# Patient Record
Sex: Female | Born: 1950 | Race: Black or African American | Hispanic: No | Marital: Married | Smoking: Former smoker
Health system: Southern US, Community
[De-identification: ages and names within clinical notes are randomized; demographics above are authoritative.]

## PROBLEM LIST (undated history)

## (undated) DIAGNOSIS — T7840XA Allergy, unspecified, initial encounter: Secondary | ICD-10-CM

## (undated) DIAGNOSIS — D573 Sickle-cell trait: Secondary | ICD-10-CM

## (undated) DIAGNOSIS — G250 Essential tremor: Secondary | ICD-10-CM

## (undated) DIAGNOSIS — H409 Unspecified glaucoma: Secondary | ICD-10-CM

## (undated) DIAGNOSIS — H269 Unspecified cataract: Secondary | ICD-10-CM

## (undated) DIAGNOSIS — I509 Heart failure, unspecified: Secondary | ICD-10-CM

## (undated) DIAGNOSIS — E7800 Pure hypercholesterolemia, unspecified: Secondary | ICD-10-CM

## (undated) DIAGNOSIS — I10 Essential (primary) hypertension: Secondary | ICD-10-CM

## (undated) DIAGNOSIS — E119 Type 2 diabetes mellitus without complications: Secondary | ICD-10-CM

## (undated) DIAGNOSIS — M1990 Unspecified osteoarthritis, unspecified site: Secondary | ICD-10-CM

## (undated) DIAGNOSIS — F419 Anxiety disorder, unspecified: Secondary | ICD-10-CM

## (undated) DIAGNOSIS — E785 Hyperlipidemia, unspecified: Secondary | ICD-10-CM

## (undated) DIAGNOSIS — D75A Glucose-6-phosphate dehydrogenase (G6PD) deficiency without anemia: Secondary | ICD-10-CM

## (undated) HISTORY — PX: DIAGNOSTIC MAMMOGRAM: HXRAD719

## (undated) HISTORY — PX: REDUCTION MAMMAPLASTY: SUR839

## (undated) HISTORY — DX: Anxiety disorder, unspecified: F41.9

## (undated) HISTORY — DX: Unspecified cataract: H26.9

## (undated) HISTORY — PX: ABDOMINAL HYSTERECTOMY: SHX81

## (undated) HISTORY — PX: DG  BONE DENSITY (ARMC HX): HXRAD1102

## (undated) HISTORY — DX: Type 2 diabetes mellitus without complications: E11.9

## (undated) HISTORY — PX: ANKLE SURGERY: SHX546

## (undated) HISTORY — PX: BREAST LUMPECTOMY: SHX2

## (undated) HISTORY — DX: Pure hypercholesterolemia, unspecified: E78.00

## (undated) HISTORY — PX: EYE SURGERY: SHX253

## (undated) HISTORY — DX: Glucose-6-phosphate dehydrogenase (G6PD) deficiency without anemia: D75.A

## (undated) HISTORY — DX: Unspecified glaucoma: H40.9

## (undated) HISTORY — DX: Essential tremor: G25.0

## (undated) HISTORY — DX: Allergy, unspecified, initial encounter: T78.40XA

## (undated) HISTORY — DX: Essential (primary) hypertension: I10

## (undated) HISTORY — DX: Sickle-cell trait: D57.3

## (undated) HISTORY — DX: Hyperlipidemia, unspecified: E78.5

---

## 2004-10-13 ENCOUNTER — Other Ambulatory Visit: Admission: RE | Admit: 2004-10-13 | Discharge: 2004-10-13 | Payer: Self-pay | Admitting: Obstetrics and Gynecology

## 2004-10-20 ENCOUNTER — Ambulatory Visit (HOSPITAL_COMMUNITY): Admission: RE | Admit: 2004-10-20 | Discharge: 2004-10-20 | Payer: Self-pay | Admitting: Obstetrics and Gynecology

## 2009-11-12 LAB — HM COLONOSCOPY

## 2015-01-11 LAB — HM COLONOSCOPY

## 2019-07-16 ENCOUNTER — Encounter: Payer: Self-pay | Admitting: Neurology

## 2019-07-21 ENCOUNTER — Encounter: Payer: Self-pay | Admitting: Neurology

## 2019-07-24 ENCOUNTER — Encounter: Payer: Self-pay | Admitting: Neurology

## 2019-07-24 NOTE — Progress Notes (Addendum)
Wendy Obrien was seen today in the movement disorders clinic for neurologic consultation at the request of Loretha Brasil, FNP.  The consultation is for the evaluation of essential tremor.  Tremor: Yes.   , head tremor.  saw a neurologist in [place] a year ago.  Started on primidone, full tablet but unsure of dose and felt hot sweaty and "horrible."  She didn't take it again.  She was then started on propranolol LA, 60 mg.  Didn't see any difference and then increased to 120 mg daily.  She then noted a return of tremor and was told to increase to 3 tablets and a month ago she increased to 4 tablets on her own.  No SE.  She is noting head tremor.  Her head shakes in the "yes" direction.  That comes and goes.  It is worse when she concentrates on something.    How long has it been going on? 1-2 years and always been the head (never really been the hands)  At rest or with activation?  activation  When is it noted the most?  With concentration on something  Fam hx of tremor?  Yes.  , maternal GM had head tremor - worse when GM would have alcohol  Affected by caffeine:  No.  Affected by alcohol:  No, doesn't drink enough to know  Affected by stress:  Yes.    Affected by fatigue:  No.  Spills soup if on spoon:  No.  Spills glass of liquid if full:  No.  Affects ADL's (tying shoes, brushing teeth, etc):  No.  Tremor inducing meds:  No.   Tremor improving medications: Yes, propranolol.  Was on primidone but only tried one pill.   Doesn't notice that head tremor changes with head position.  No neck pain.  No limited rotation in the neck.    Other Specific Symptoms:  Voice: hoarse a little Sleep: trouble getting and staying asleep Postural symptoms:  Yes.  , "a little"  Falls?  No., last fall 2 years ago Loss of smell:  No. Loss of taste:  No. Urinary Incontinence:  No. Difficulty Swallowing:  No. Handwriting, micrographia: No. Trouble with ADL's:  No.  Trouble buttoning clothing:  No. Depression:  Yes.   - "especially with whats going on in these days" Memory changes:  Yes.  , concerned that memory is not as good - short term - able to do finances - able to distribute medications - has some word finding troubles N/V:  No. Lightheaded:  No.  Syncope: No. Diplopia:  No. Dyskinesia:  No.  Neuroimaging of the brain has not previously been performed.   PREVIOUS MEDICATIONS: propranolol; first dose effect with primidone  ALLERGIES:   Allergies  Allergen Reactions   Beeswax Anaphylaxis    bees   Shellfish Allergy Anaphylaxis    And all seafood   Aspirin     G6P Deficiency   Eggs Or Egg-Derived Products Hives   Nsaids     G6P Deficiency   Sulfa Antibiotics     G6P Deficiency     CURRENT MEDICATIONS:  Current Outpatient Medications  Medication Instructions   atorvastatin (LIPITOR) 20 mg, Oral, Daily   hydrochlorothiazide (HYDRODIURIL) 25 mg, Oral, Daily   latanoprost (XALATAN) 0.005 % ophthalmic solution 1 drop, Daily at bedtime   losartan (COZAAR) 100 mg, Oral, Daily   metFORMIN (GLUCOPHAGE) 500 MG tablet Oral, 2 times daily with meals   propranolol ER (INDERAL LA) 120 mg, Oral, Daily  propranolol ER (INDERAL LA) 60 mg, Oral, Daily, 3 tablets daily    timolol (BETIMOL) 0.25 % ophthalmic solution 1-2 drops, 2 times daily    PAST MEDICAL HISTORY:   Past Medical History:  Diagnosis Date   DM (diabetes mellitus) ([place])    Essential tremor    head   G6PD deficiency    HTN (hypertension)    Hyperlipidemia     PAST SURGICAL HISTORY:   Past Surgical History:  Procedure Laterality Date   ABDOMINAL HYSTERECTOMY     ANKLE SURGERY Right    x 2   BREAST LUMPECTOMY Left    benign   EYE SURGERY Left    age 23 - L eye removed    SOCIAL HISTORY:   Social History   Socioeconomic History   Marital status: Married    Spouse name: Not on file   Number of children: Not on file   Years of education: Not on file   Highest  education level: Not on file  Occupational History   Not on file  Social Needs   Financial resource strain: Not on file   Food insecurity    Worry: Not on file    Inability: Not on file   Transportation needs    Medical: Not on file    Non-medical: Not on file  Tobacco Use   Smoking status: Former Smoker    Quit date: 1998    Years since quitting: 22.8   Smokeless tobacco: Never Used  Substance and Sexual Activity   Alcohol use: Yes    Comment: occasional wine on holiday   Drug use: Not on file   Sexual activity: Yes  Lifestyle   Physical activity    Days per week: Not on file    Minutes per session: Not on file   Stress: Not on file  Relationships   Social connections    Talks on phone: Not on file    Gets together: Not on file    Attends religious service: Not on file    Active member of club or organization: Not on file    Attends meetings of clubs or organizations: Not on file    Relationship status: Not on file   Intimate partner violence    Fear of current or ex partner: Not on file    Emotionally abused: Not on file    Physically abused: Not on file    Forced sexual activity: Not on file  Other Topics Concern   Not on file  Social History Narrative   Not on file    FAMILY HISTORY:   Family Status  Relation Name Status   Mother  Deceased   Father  Deceased   Sister 59 Alive   Brother 1 Deceased   Brother  Insurance risk surveyor   Child 4 Alive    ROS:  Review of Systems  Constitutional: Negative.   HENT: Negative.   Eyes: Negative.   Cardiovascular: Negative.   Gastrointestinal: Negative.   Genitourinary: Negative.   Skin: Negative.   Endo/Heme/Allergies: Negative.     PHYSICAL EXAMINATION:    VITALS:   Vitals:   07/25/19 0931  BP: (!) 153/88  Pulse: 60  Resp: 16  Temp: 97.8 F (36.6 C)  SpO2: 96%  Weight: 276 lb 12.8 oz (125.6 kg)  Height: 5\' 8"  (1.727 m)    GEN:  The patient appears stated age and is in  NAD. HEENT:  Normocephalic, atraumatic.  The mucous membranes  are moist. The superficial temporal arteries are without ropiness or tenderness. CV:  RRR Lungs:  CTAB Neck/HEME:  There are no carotid bruits bilaterally.  There is an irregular head jerk to the left.  It is very intermittent.  It is very difficult to reproduce this.  No significant hypertrophy of the muscles of the neck.  Neurological examination:  Orientation: The patient is alert and oriented x3. Fund of knowledge is appropriate.  Recent and remote memory are intact.  Attention and concentration are normal.    Able to name objects and repeat phrases. Cranial nerves: There is good facial symmetry.  Extraocular muscles are intact on the right.  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 The visual fields are full to confrontational testing. The speech is fluent and clear. Soft palate rises symmetrically and there is no tongue deviation. Hearing is intact to conversational tone. Sensation: Sensation is intact to light and pinprick throughout (facial, trunk, extremities). Vibration is intact at the bilateral big toe but decreased distally. There is no extinction with double simultaneous stimulation. There is no sensory dermatomal level identified. Motor: Strength is 5/5 in the bilateral upper and lower extremities.   Shoulder shrug is equal and symmetric.  There is no pronator drift. Deep tendon reflexes: Deep tendon reflexes are 0-1/4 at the bilateral biceps, triceps, brachioradialis, patella and achilles. Plantar responses are downgoing bilaterally.  Movement examination: Tone: There is normal tone in the upper and lower extremities. Abnormal movements: there is mild RUE rest tremor with distraction only.  No trouble with Archimedes spirals.  No trouble with pouring water from 1 glass to another. Coordination:  There is no decremation with RAM's, with any form of RAMS, including alternating supination and pronation of the forearm, hand  opening and closing, finger taps, heel taps and toe taps. Gait and Station: The patient has no difficulty arising out of a deep-seated chair without the use of the hands. The patient's stride length is good.    Medical records indicate that the patient had lab work on July 15, 2019, including CBC, CMP, TSH.  I do not have the results of those.  Addendum labs: Lab work is received and was dated July 21, 2019.  Total iron was 138, with a percent sat of 47.  Vitamin D was low at 25.  Folate was greater than 24.  TSH was normal at 4.48.  Her B12 was likewise normal at 525.  ASSESSMENT/PLAN:  1.  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  She used to think propranolol was helpful, but that now is not so sure.  It is possible that this was helping.  Patient really would like to try to get off of the medication.  I told her that the symptom may get worse, although I have really not had propranolol be significantly helpful for head tremor in the past.  She asks me how to safely wean the medication.  She is currently on propranolol LA, 60 mg, 4 tablets nightly.  I told her she could try to drop 2 weeks and then see how she felt.  If she did not think she was worse, she could try to drop another tablet for 2 weeks and see how she felt.  She is to watch her blood pressure as well, and was it was high today.  She states that she watches it at home and it is normal.  -We  discussed the value of Botox.  She will think about that.  I suspect her symptoms really are not bad enough for that right now.  We discussed risk, benefits, and side effects of botulinum toxin, including the black box warning.  -We discussed alternative treatments like clonazepam.  2.  Memory loss  -I do not really suspect a neurodegenerative process.  She does tell me that she recently had lab work.  I have called a few times  for that lab work, but do not yet have the results.  She does state that she was recently told that she had a B12 deficiency and hypothyroidism.  It has not been treated.  She does state that she was told she had to have repeat blood work, and she is waiting the results of that.  I and see how tell her that B12 deficiency can cause some degree of memory change, depending on how low it can be.  I would recommend that she have her B12 and thyroid corrected/replaced and see how she feels in terms of memory.  She does not feel significantly better, then we can schedule neurocognitive testing.  I did tell her she could just call our office for that.   3.  We will follow up with her on an as-needed basis.  She has several questions and I answered those to the best of my ability.  Patient education was given to the patient today.  Much greater than 50% of this visit was spent in counseling and coordinating care.  Total face to face time:  45 min  Cc:  Rocco Serene, MD

## 2019-07-25 ENCOUNTER — Other Ambulatory Visit: Payer: Self-pay

## 2019-07-25 ENCOUNTER — Encounter: Payer: Self-pay | Admitting: Neurology

## 2019-07-25 ENCOUNTER — Ambulatory Visit (INDEPENDENT_AMBULATORY_CARE_PROVIDER_SITE_OTHER): Payer: Medicare Other | Admitting: Neurology

## 2019-07-25 VITALS — BP 153/88 | HR 60 | Temp 97.8°F | Resp 16 | Ht 68.0 in | Wt 276.8 lb

## 2019-07-25 DIAGNOSIS — R413 Other amnesia: Secondary | ICD-10-CM

## 2019-07-25 DIAGNOSIS — G243 Spasmodic torticollis: Secondary | ICD-10-CM

## 2019-07-25 NOTE — Patient Instructions (Signed)
Good to see you today!  If you think that the propranolol is helping stay on it.  If not, you can try to decrease it by one pill for the next 2 weeks.  If you are the same (not worse), you can drop another pill and see how you do for the next 2 weeks.  Keep [place] updated.  If your memory is still not good once your B12 and thyroid function is corrected we can get you scheduled for neurocognitive testing at our office.  Let us know about that!  The physicians and staff at [facility] are committed to providing excellent care. You may receive a survey requesting feedback about your experience at our office. We strive to receive "very good" responses to the survey questions. If you feel that your experience would prevent you from giving the office a "very good " response, please contact our office to try to remedy the situation. We may be reached at [phone]. Thank you for taking the time out of your busy day to complete the survey.

## 2019-08-13 ENCOUNTER — Other Ambulatory Visit: Payer: Self-pay | Admitting: Internal Medicine

## 2019-08-13 DIAGNOSIS — Z1231 Encounter for screening mammogram for malignant neoplasm of breast: Secondary | ICD-10-CM

## 2019-08-27 ENCOUNTER — Telehealth: Payer: Self-pay | Admitting: Neurology

## 2019-08-27 DIAGNOSIS — R413 Other amnesia: Secondary | ICD-10-CM

## 2019-08-27 NOTE — Telephone Encounter (Signed)
Please schedule testing please

## 2019-08-27 NOTE — Telephone Encounter (Signed)
I believe I have seen them, but I would have sent them to scanning where they are to be filed once I have reviewed them.  IF I am remembering correct, they were normal.  She is welcome to schedule neurocognitive testing if she would like, as per my last note.

## 2019-08-27 NOTE — Telephone Encounter (Signed)
I got her schedule to see Dr Melvyn Novas

## 2019-08-27 NOTE — Telephone Encounter (Signed)
Have you reviewed these? Not scanned into chart at this time

## 2019-08-27 NOTE — Telephone Encounter (Signed)
Lonn Georgia called regarding needing to know if our office received this patients lab work that was faxed over? She said you can call her at [phone] if you did not receive them and she will refax them. I believe we were needing them before she could schedule another appt? Thank you

## 2019-09-27 ENCOUNTER — Encounter: Payer: Self-pay | Admitting: Emergency Medicine

## 2019-09-27 ENCOUNTER — Ambulatory Visit
Admission: EM | Admit: 2019-09-27 | Discharge: 2019-09-27 | Disposition: A | Discharge status: Home / Self Care | Payer: Medicare Other

## 2019-09-27 ENCOUNTER — Other Ambulatory Visit: Payer: Self-pay

## 2019-09-27 DIAGNOSIS — H538 Other visual disturbances: Secondary | ICD-10-CM

## 2019-09-27 NOTE — Discharge Instructions (Signed)
Use moisturizing eye drops. Continue glaucoma drops as normal. Reduce screen time & improve sleep time as able. Follow up w/ Dr. Zenia Resides office Tuesday.

## 2019-09-27 NOTE — ED Provider Notes (Signed)
[name]    CSN: [id number] Arrival date & time: 09/27/19  1243      History   Chief Complaint Chief Complaint  Patient presents with  . Blurred Vision    HPI Wendy Obrien is a 69 y.o. female with history of diabetes, tremor, hypertension presenting for blurred vision since this morning.  Patient states she noticed it when she got up in the today found.  Denies visual disturbances such as wavy lines, bright lights, black curtain, visual field loss.  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  Patient does have history of glaucoma: Taking latanoprost as directed.  Patient was seen by her ophthalmologist 6 weeks ago with "a great exam ".  Patient denies trauma to her eye, mucoid discharge.  Patient noticed subtle redness to her medial canthus: Denies foreign body sensation, photophobia, eye pain, foreign body exposure.  Patient does wear corrective lenses: Denies contact use.   Past Medical History:  Diagnosis Date  . DM (diabetes mellitus) ([place])   . Essential tremor    head  . G6PD deficiency   . HTN (hypertension)   . Hyperlipidemia     There are no problems to display for this patient.   Past Surgical History:  Procedure Laterality Date  . ABDOMINAL HYSTERECTOMY    . ANKLE SURGERY Right    x 2  . BREAST LUMPECTOMY Left    benign  . EYE SURGERY Left    age 86 - L eye removed    OB History   No obstetric history on file.      Home Medications    Prior to Admission medications   Medication Sig Start Date End Date Taking? Authorizing Provider  atorvastatin (LIPITOR) 20 MG tablet Take 20 mg by mouth daily.    [provider]  hydrochlorothiazide (HYDRODIURIL) 25 MG tablet Take 25 mg by mouth daily.    [provider]  latanoprost (XALATAN) 0.005 % ophthalmic solution 1 drop at bedtime.    [provider]  losartan (COZAAR) 100 MG tablet Take 100 mg by mouth daily.    [provider]   metFORMIN (GLUCOPHAGE) 500 MG tablet Take by mouth 2 (two) times daily with a meal.    [provider]  propranolol ER (INDERAL LA) 120 MG 24 hr capsule Take 120 mg by mouth daily.    [provider]  propranolol ER (INDERAL LA) 60 MG 24 hr capsule Take 60 mg by mouth daily. 3 tablets daily    [provider]  timolol (BETIMOL) 0.25 % ophthalmic solution 1-2 drops 2 (two) times daily.    [provider]    Family History Family History  Problem Relation Age of Onset  . Lung cancer Mother   . Colon cancer Father   . HIV Brother   . HIV Brother     Social History Social History   Tobacco Use  . Smoking status: Former Smoker    Quit date: 1998    Years since quitting: 23.0  . Smokeless tobacco: Never Used  Substance Use Topics  . Alcohol use: Yes    Comment: occasional wine on holiday  . Drug use: Not on file     Allergies   Beeswax, Shellfish allergy, Aspirin, Eggs or egg-derived products, Nsaids, and Sulfa antibiotics   Review of Systems As per HPI   Physical Exam Triage Vital Signs ED Triage Vitals  Enc Vitals Group     BP  Pulse      Resp      Temp      Temp src      SpO2      Weight      Height      Head Circumference      Peak Flow      Pain Score      Pain Loc      Pain Edu?      Excl. in [place]?    No data found.  Updated Vital Signs BP 140/87 (BP Location: Left Arm)   Pulse 64   Temp 98.2 F (36.8 C) (Oral)   Resp 16   SpO2 94%   Visual Acuity Right Eye Distance:   Left Eye Distance:   Bilateral Distance:    Right Eye Near: R Near: 20/20(with corrective lense) Left Eye Near:  L Near: (prosthetic eye, no vision) Bilateral Near:  20/100  Physical Exam Constitutional:      General: She is not in acute distress.    Appearance: She is obese. She is not ill-appearing.  HENT:     Head: Normocephalic and atraumatic.  Eyes:     General: Lids are normal. Lids are everted, no foreign bodies  appreciated. Vision grossly intact. No visual field deficit or scleral icterus.       Right eye: No foreign body, discharge or hordeolum.     Extraocular Movements: Extraocular movements intact.     Right eye: Normal extraocular motion and no nystagmus.     Left eye: Abnormal extraocular motion present.     Conjunctiva/sclera: Conjunctivae normal.     Right eye: Right conjunctiva is not injected. No chemosis, exudate or hemorrhage.    Pupils:     Right eye: Pupil is round, reactive and not sluggish. No corneal abrasion.     Comments: Left eye prosthesis in place  Cardiovascular:     Rate and Rhythm: Normal rate.  Pulmonary:     Effort: Pulmonary effort is normal.  Musculoskeletal:     Cervical back: No tenderness.  Lymphadenopathy:     Cervical: No cervical adenopathy.  Skin:    Coloration: Skin is not jaundiced or pale.  Neurological:     Mental Status: She is alert and oriented to person, place, and time.      UC Treatments / Results  Labs (all labs ordered are listed, but only abnormal results are displayed) Labs Reviewed - No data to display  EKG   Radiology No results found.  Procedures Procedures (including critical care time)  Medications Ordered in UC Medications - No data to display  Initial Impression / Assessment and Plan / UC Course  I have reviewed the triage vital signs and the nursing notes.  Pertinent labs & imaging results that were available during my care of the patient were reviewed by me and considered in my medical decision making (see chart for details).     Patient afebrile, nontoxic.  Eye exam very reassuring in office.  Patient's visual acuity initially checked without corrective lenses: 20/100.  With corrective lenses 20/20 with slightly perceived blurred per patient.  Blurring appears to be intermittent in nature, low concern for acute process.  Of note patient does admit to significantly increased screen time yesterday with poor sleep.   Likely related to dry eye from excessive screen time and poor sleep.  Will try adding moisturizing eyedrops to current regimen, reducing screen time over the next 24 hours, and following up with ophthalmology via  phone on Monday.  Patient verbalized understanding and reassurance, agreeable to plan.   Final Clinical Impressions(s) / UC Diagnoses   Final diagnoses:  Blurred vision, right eye     Discharge Instructions     Use moisturizing eye drops. Continue glaucoma drops as normal. Reduce screen time & improve sleep time as able. Follow up w/ Dr. Zenia Resides office Tuesday.    ED Prescriptions    None     PDMP not reviewed this encounter.   [place], [place], [place] 09/28/19 [id number]

## 2019-09-27 NOTE — ED Triage Notes (Signed)
Pt presents to [facility] for assessment of right eye redness she noted this morning to the inner canthus, as well as blurred vision when looking at her phone.

## 2019-10-03 ENCOUNTER — Ambulatory Visit: Payer: Medicare Other

## 2019-10-06 ENCOUNTER — Other Ambulatory Visit: Payer: Self-pay | Admitting: Family Medicine

## 2019-10-06 ENCOUNTER — Other Ambulatory Visit: Payer: Self-pay

## 2019-10-06 ENCOUNTER — Ambulatory Visit
Admission: RE | Admit: 2019-10-06 | Discharge: 2019-10-06 | Disposition: A | Discharge status: Home / Self Care | Payer: Medicare Other | Source: Ambulatory Visit | Attending: Internal Medicine | Admitting: Internal Medicine

## 2019-10-06 DIAGNOSIS — Z1231 Encounter for screening mammogram for malignant neoplasm of breast: Secondary | ICD-10-CM

## 2019-10-08 ENCOUNTER — Other Ambulatory Visit: Payer: Self-pay | Admitting: Family Medicine

## 2019-10-08 DIAGNOSIS — Z780 Asymptomatic menopausal state: Secondary | ICD-10-CM

## 2019-11-03 ENCOUNTER — Encounter: Payer: Medicare Other | Admitting: Psychology

## 2019-11-04 ENCOUNTER — Ambulatory Visit (INDEPENDENT_AMBULATORY_CARE_PROVIDER_SITE_OTHER): Payer: Medicare Other | Admitting: Orthopaedic Surgery

## 2019-11-04 ENCOUNTER — Other Ambulatory Visit: Payer: Self-pay

## 2019-11-04 ENCOUNTER — Ambulatory Visit: Payer: Self-pay

## 2019-11-04 DIAGNOSIS — M25571 Pain in right ankle and joints of right foot: Secondary | ICD-10-CM | POA: Diagnosis not present

## 2019-11-04 DIAGNOSIS — M25572 Pain in left ankle and joints of left foot: Secondary | ICD-10-CM

## 2019-11-04 DIAGNOSIS — G8929 Other chronic pain: Secondary | ICD-10-CM | POA: Insufficient documentation

## 2019-11-04 NOTE — Progress Notes (Signed)
Office Visit Note   Patient: Wendy Obrien           Date of Birth: 1950/09/28           MRN: [id number] Visit Date: 11/04/2019              Requested by: Wendy Serene, MD [place],  [place] [address] PCP: Wendy Serene, MD   Assessment & Plan: Visit Diagnoses:  1. Chronic pain of both ankles     Plan: Impression bilateral ankle pain.  My suspicion is that it is due to pitting edema.  I do not identify any structural problems with her ankle.  She is not endorsing any symptoms consistent with ankle arthrosis.  I recommended compression socks during the daytime.  I will defer diuretics to her PCP.  We will see her back as needed.  Follow-Up Instructions: Return if symptoms worsen or fail to improve.   Orders:  Orders Placed This Encounter  Procedures  . XR Ankle Complete Left  . XR Ankle Complete Right   No orders of the defined types were placed in this encounter.     Procedures: No procedures performed   Clinical Data: No additional findings.   Subjective: Chief Complaint  Patient presents with  . Left Ankle - Pain  . Right Ankle - Pain    Wendy Obrien is a 69 year old female no is relocated down here from [place] comes in for evaluation of bilateral ankle pain worse on the right.  She states that the skin is very tender to touch and she has swelling around the ankles.  She does not have any pain with weightbearing or ambulation.  She is status post ORIF bimalleolar ankle fracture in 1998.  She denies any numbness and tingling.  Denies any anterior ankle pain.   Review of Systems  Constitutional: Negative.   HENT: Negative.   Eyes: Negative.   Respiratory: Negative.   Cardiovascular: Negative.   Endocrine: Negative.   Musculoskeletal: Negative.   Neurological: Negative.   Hematological: Negative.   Psychiatric/Behavioral: Negative.   All other systems reviewed and are negative.    Objective: Vital Signs: There were no vitals taken for this  visit.  Physical Exam Vitals and nursing note reviewed.  Constitutional:      Appearance: She is well-developed.  HENT:     Head: Normocephalic and atraumatic.  Pulmonary:     Effort: Pulmonary effort is normal.  Abdominal:     Palpations: Abdomen is soft.  Musculoskeletal:     Cervical back: Neck supple.  Skin:    General: Skin is warm.     Capillary Refill: Capillary refill takes less than 2 seconds.  Neurological:     Mental Status: She is alert and oriented to person, place, and time.  Psychiatric:        Behavior: Behavior normal.        Thought Content: Thought content normal.        Judgment: Judgment normal.     Ortho Exam Right ankle exam shows fully healed surgical scar.  She is very tender along this lateral scar.  She is tender along the posterior medial aspect of the ankle as well.  She has no pain with ankle dorsiflexion plantarflexion.  Subtalar motion is painless.  Left ankle exam shows tenderness anterolaterally over the ATFL.  There is no instability.  Subtalar motion is normal and painless.  Ankle range of motion is normal painless. Specialty Comments:  No specialty comments  available.  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    PMFS History: Patient Active Problem List   Diagnosis Date Noted  . Chronic pain of both ankles 11/04/2019   Past Medical History:  Diagnosis Date  . DM (diabetes mellitus) ([place])   . Essential tremor    head  . G6PD deficiency   . HTN (hypertension)   . Hyperlipidemia     Family History  Problem Relation Age of Onset  . Lung cancer Mother   . Colon cancer Father   . HIV Brother   . HIV Brother     Past Surgical History:  Procedure Laterality Date  . ABDOMINAL HYSTERECTOMY    . ANKLE SURGERY Right    x 2  . BREAST LUMPECTOMY Left     benign  . EYE SURGERY Left    age 49 - L eye removed  . REDUCTION MAMMAPLASTY Bilateral    1998   Social History   Occupational History  . Not on file  Tobacco Use  . Smoking status: Former Smoker    Quit date: 1998    Years since quitting: 23.1  . Smokeless tobacco: Never Used  Substance and Sexual Activity  . Alcohol use: Yes    Comment: occasional wine on holiday  . Drug use: Not on file  . Sexual activity: Yes

## 2019-11-17 ENCOUNTER — Encounter: Payer: Medicare Other | Admitting: Psychology

## 2019-12-25 ENCOUNTER — Ambulatory Visit
Admission: RE | Admit: 2019-12-25 | Discharge: 2019-12-25 | Disposition: A | Discharge status: Home / Self Care | Payer: Medicare Other | Source: Ambulatory Visit | Attending: Family Medicine | Admitting: Family Medicine

## 2019-12-25 ENCOUNTER — Other Ambulatory Visit: Payer: Self-pay

## 2019-12-25 DIAGNOSIS — Z780 Asymptomatic menopausal state: Secondary | ICD-10-CM

## 2020-05-06 ENCOUNTER — Other Ambulatory Visit: Payer: Self-pay | Admitting: General Practice

## 2020-05-06 DIAGNOSIS — M7989 Other specified soft tissue disorders: Secondary | ICD-10-CM

## 2020-05-12 ENCOUNTER — Ambulatory Visit
Admission: RE | Admit: 2020-05-12 | Discharge: 2020-05-12 | Disposition: A | Discharge status: Home / Self Care | Payer: Medicare Other | Source: Ambulatory Visit | Attending: General Practice | Admitting: General Practice

## 2020-05-12 ENCOUNTER — Other Ambulatory Visit: Payer: Self-pay | Admitting: General Practice

## 2020-05-12 DIAGNOSIS — M25432 Effusion, left wrist: Secondary | ICD-10-CM

## 2020-05-12 DIAGNOSIS — M7989 Other specified soft tissue disorders: Secondary | ICD-10-CM

## 2020-06-26 ENCOUNTER — Ambulatory Visit: Payer: Medicare Other | Attending: Internal Medicine

## 2020-06-26 DIAGNOSIS — Z23 Encounter for immunization: Secondary | ICD-10-CM

## 2020-06-26 NOTE — Progress Notes (Signed)
   Covid-19 Vaccination Clinic  Name:  Wendy Obrien    MRN: [id number] DOB: 07-29-1951  06/26/2020  Wendy Obrien was observed post Covid-19 immunization for 15 minutes without incident. She was provided with Vaccine Information Sheet and instruction to access the V-Safe system.   Wendy Obrien was instructed to call 911 with any severe reactions post vaccine: Marland Kitchen Difficulty breathing  . Swelling of face and throat  . A fast heartbeat  . A bad rash all over body  . Dizziness and weakness

## 2020-07-06 ENCOUNTER — Other Ambulatory Visit: Payer: Self-pay

## 2020-07-06 ENCOUNTER — Ambulatory Visit (INDEPENDENT_AMBULATORY_CARE_PROVIDER_SITE_OTHER): Payer: Medicare Other | Admitting: Podiatry

## 2020-07-06 VITALS — HR 53 | Temp 97.9°F

## 2020-07-06 DIAGNOSIS — M79676 Pain in unspecified toe(s): Secondary | ICD-10-CM | POA: Diagnosis not present

## 2020-07-06 DIAGNOSIS — L600 Ingrowing nail: Secondary | ICD-10-CM | POA: Diagnosis not present

## 2020-07-12 NOTE — Progress Notes (Signed)
  Subjective:  Patient ID: Wendy Obrien, female    DOB: September 18, 1950,  MRN: [id number]  Chief Complaint  Patient presents with  . Diabetes    requesting routine foot care  . Ingrown Toenail    right foot great toe lateral border    69 y.o. female presents with the above complaint. History confirmed with patient.   Objective:  Physical Exam: warm, good capillary refill, nail exam normal nails without lesions, no trophic changes or ulcerative lesions. DP pulses palpable, PT pulses palpable and protective sensation intact Left Foot: normal exam, no swelling, tenderness, instability; ligaments intact, full range of motion of all ankle/foot joints  Right Foot: ingrown nail right lateral border, no warmth, erythema, signs of acute infection noted.   No images are attached to the encounter.  Assessment:   1. Ingrown nail   2. Pain around toenail      Plan:  Patient was evaluated and treated and all questions answered.  Ingrown Nail -Debrided in slant back fashion. Other nails trimmed. She does not meet criteria for routine care.   No follow-ups on file.

## 2020-07-26 ENCOUNTER — Encounter: Payer: Self-pay | Admitting: Podiatry

## 2020-07-26 ENCOUNTER — Ambulatory Visit (INDEPENDENT_AMBULATORY_CARE_PROVIDER_SITE_OTHER): Payer: Medicare Other | Admitting: Podiatry

## 2020-07-26 ENCOUNTER — Other Ambulatory Visit: Payer: Self-pay

## 2020-07-26 DIAGNOSIS — L03031 Cellulitis of right toe: Secondary | ICD-10-CM

## 2020-07-26 NOTE — Progress Notes (Signed)
Subjective:   Patient ID: Wendy Obrien, female   DOB: 69 y.o.   MRN: [id number]   HPI Patient states I had a very mild procedure done to my right big toenail but it still continues to be sore and I felt something stuck up in there and I have noticed a little bit of redness and puffiness   ROS      Objective:  Physical Exam  Neurovascular status intact with patient's right hallux lateral border showing slight redness with crusted tissue and a more proximal matter that is very tender when I pressed deep into it     Assessment:  Probability for paronychia infection of the right hallux lateral border     Plan:  H&P anesthetized the digit 60 mg like Marcaine mixture sterile prep done and using sterile instrumentation I did open up the area cleaned up the tissue and utilized instrument to remove all proud flesh formation.  Patient will allow this to drain out begin soaks and sterile dressing applied today and will be seen back if any issues were to occur

## 2020-08-10 ENCOUNTER — Ambulatory Visit: Payer: Medicare Other | Admitting: Podiatry

## 2020-10-04 ENCOUNTER — Other Ambulatory Visit: Payer: Self-pay | Admitting: General Practice

## 2020-10-04 ENCOUNTER — Ambulatory Visit
Admission: RE | Admit: 2020-10-04 | Discharge: 2020-10-04 | Disposition: A | Discharge status: Home / Self Care | Payer: Medicare Other | Source: Ambulatory Visit | Attending: General Practice | Admitting: General Practice

## 2020-10-04 DIAGNOSIS — R10A0 Flank pain, unspecified side: Secondary | ICD-10-CM

## 2020-10-04 DIAGNOSIS — R109 Unspecified abdominal pain: Secondary | ICD-10-CM

## 2020-10-07 ENCOUNTER — Other Ambulatory Visit: Payer: Self-pay | Admitting: General Practice

## 2020-10-07 DIAGNOSIS — Z0000 Encounter for general adult medical examination without abnormal findings: Secondary | ICD-10-CM

## 2020-10-08 ENCOUNTER — Ambulatory Visit: Payer: Medicare Other | Admitting: Podiatry

## 2020-11-22 ENCOUNTER — Other Ambulatory Visit: Payer: Self-pay

## 2020-11-22 ENCOUNTER — Ambulatory Visit
Admission: RE | Admit: 2020-11-22 | Discharge: 2020-11-22 | Disposition: A | Discharge status: Home / Self Care | Payer: Medicare Other | Source: Ambulatory Visit | Attending: General Practice | Admitting: General Practice

## 2020-11-22 DIAGNOSIS — Z0000 Encounter for general adult medical examination without abnormal findings: Secondary | ICD-10-CM

## 2021-03-19 ENCOUNTER — Observation Stay (HOSPITAL_COMMUNITY)
Admission: EM | Admit: 2021-03-19 | Discharge: 2021-03-20 | Disposition: A | Discharge status: Home / Self Care | Payer: Medicare Other | Attending: Internal Medicine | Admitting: Internal Medicine

## 2021-03-19 ENCOUNTER — Emergency Department (HOSPITAL_COMMUNITY): Payer: Medicare Other

## 2021-03-19 ENCOUNTER — Other Ambulatory Visit: Payer: Self-pay

## 2021-03-19 DIAGNOSIS — Z79899 Other long term (current) drug therapy: Secondary | ICD-10-CM | POA: Insufficient documentation

## 2021-03-19 DIAGNOSIS — I249 Acute ischemic heart disease, unspecified: Principal | ICD-10-CM | POA: Insufficient documentation

## 2021-03-19 DIAGNOSIS — Z87891 Personal history of nicotine dependence: Secondary | ICD-10-CM | POA: Insufficient documentation

## 2021-03-19 DIAGNOSIS — I10 Essential (primary) hypertension: Secondary | ICD-10-CM | POA: Diagnosis not present

## 2021-03-19 DIAGNOSIS — Z20822 Contact with and (suspected) exposure to covid-19: Secondary | ICD-10-CM | POA: Insufficient documentation

## 2021-03-19 DIAGNOSIS — R079 Chest pain, unspecified: Secondary | ICD-10-CM | POA: Diagnosis not present

## 2021-03-19 DIAGNOSIS — E119 Type 2 diabetes mellitus without complications: Secondary | ICD-10-CM | POA: Insufficient documentation

## 2021-03-19 DIAGNOSIS — Z7984 Long term (current) use of oral hypoglycemic drugs: Secondary | ICD-10-CM | POA: Diagnosis not present

## 2021-03-19 LAB — CBC
HCT: 41.6 % (ref 36.0–46.0)
Hemoglobin: 14 g/dL (ref 12.0–15.0)
MCH: 32.6 pg (ref 26.0–34.0)
MCHC: 33.7 g/dL (ref 30.0–36.0)
MCV: 96.7 fL (ref 80.0–100.0)
Platelets: 219 10*3/uL (ref 150–400)
RBC: 4.3 MIL/uL (ref 3.87–5.11)
RDW: 11.7 % (ref 11.5–15.5)
WBC: 7.3 10*3/uL (ref 4.0–10.5)
nRBC: 0 % (ref 0.0–0.2)

## 2021-03-19 LAB — BASIC METABOLIC PANEL
Anion gap: 10 (ref 5–15)
BUN: 17 mg/dL (ref 8–23)
CO2: 25 mmol/L (ref 22–32)
Calcium: 9.5 mg/dL (ref 8.9–10.3)
Chloride: 101 mmol/L (ref 98–111)
Creatinine, Ser: 0.89 mg/dL (ref 0.44–1.00)
GFR, Estimated: >60 mL/min (ref >60)
Glucose, Bld: 214 mg/dL — ABNORMAL HIGH (ref 70–99)
Potassium: 3.7 mmol/L (ref 3.5–5.1)
Sodium: 136 mmol/L (ref 135–145)

## 2021-03-19 LAB — TROPONIN I (HIGH SENSITIVITY)
Troponin I (High Sensitivity): 4 ng/L (ref <18)
Troponin I (High Sensitivity): 3 ng/L (ref <18)

## 2021-03-19 LAB — PROTIME-INR
INR: 1 (ref 0.8–1.2)
Prothrombin Time: 13.2 seconds (ref 11.4–15.2)

## 2021-03-19 LAB — CBG MONITORING, ED: Glucose-Capillary: 145 mg/dL — ABNORMAL HIGH (ref 70–99)

## 2021-03-19 LAB — HEPARIN LEVEL (UNFRACTIONATED)
Heparin Unfractionated: >1.1 IU/mL — ABNORMAL HIGH (ref 0.30–0.70)
Heparin Unfractionated: <0.1 IU/mL — ABNORMAL LOW (ref 0.30–0.70)

## 2021-03-19 LAB — RESP PANEL BY RT-PCR (FLU A&B, COVID) ARPGX2
Influenza A by PCR: NEGATIVE
Influenza B by PCR: NEGATIVE
SARS Coronavirus 2 by RT PCR: NEGATIVE

## 2021-03-19 LAB — APTT: aPTT: 28 seconds (ref 24–36)

## 2021-03-19 MED ORDER — HEPARIN BOLUS VIA INFUSION
2000.0000 [IU] | Freq: Once | INTRAVENOUS | Status: AC
  Administered 2021-03-20: 2000 [IU] via INTRAVENOUS
  Filled 2021-03-19: qty 2000

## 2021-03-19 MED ORDER — INSULIN ASPART 100 UNIT/ML IJ SOLN
0.0000 [IU] | Freq: Three times a day (TID) | INTRAMUSCULAR | Status: DC
Start: 2021-03-20 — End: 2021-03-20
  Filled 2021-03-19: qty 0.15

## 2021-03-19 MED ORDER — INSULIN ASPART 100 UNIT/ML IJ SOLN
0.0000 [IU] | Freq: Every day | INTRAMUSCULAR | Status: DC
  Filled 2021-03-19: qty 0.05

## 2021-03-19 MED ORDER — TRAZODONE HCL 100 MG PO TABS
150.0000 mg | ORAL_TABLET | Freq: Once | ORAL | Status: AC
  Administered 2021-03-20: 150 mg via ORAL
  Filled 2021-03-19: qty 2

## 2021-03-19 MED ORDER — ACETAMINOPHEN 325 MG PO TABS: 650.0000 mg | ORAL_TABLET | ORAL | Status: DC | PRN

## 2021-03-19 MED ORDER — HEPARIN (PORCINE) 25000 UT/250ML-% IV SOLN
1400.0000 [IU]/h | INTRAVENOUS | Status: DC
  Administered 2021-03-19: 1000 [IU]/h via INTRAVENOUS
  Administered 2021-03-20: 1400 [IU]/h via INTRAVENOUS
  Filled 2021-03-19 (×2): qty 250

## 2021-03-19 MED ORDER — HEPARIN BOLUS VIA INFUSION
4000.0000 [IU] | Freq: Once | INTRAVENOUS | Status: AC
  Administered 2021-03-19: 4000 [IU] via INTRAVENOUS
  Filled 2021-03-19: qty 4000

## 2021-03-19 MED ORDER — ONDANSETRON HCL 4 MG/2ML IJ SOLN: 4.0000 mg | Freq: Four times a day (QID) | INTRAMUSCULAR | Status: DC | PRN

## 2021-03-19 MED ORDER — ASPIRIN 81 MG PO CHEW
324.0000 mg | CHEWABLE_TABLET | Freq: Once | ORAL | Status: AC
  Administered 2021-03-19: 324 mg via ORAL
  Filled 2021-03-19: qty 4

## 2021-03-19 NOTE — ED Triage Notes (Addendum)
Patient reports chest pain x3 days radiating toward ribs and shoulder. Denies nausea/vomiting. Diaphoretic in triage. Pain rated 6/10. Patient reports bilateral ankle swelling. Denies history of chf. Denies shobr.

## 2021-03-19 NOTE — H&P (Signed)
History and Physical    Wendy Obrien NLZ:[id number] DOB: 1951/03/21 DOA: 03/19/2021  PCP: Rocco Serene, MD  Patient coming from: Home  Chief Complaint: left chest pain  HPI: Wendy Obrien is a 70 y.o. female with medical history significant of DM2, G6PD, HTN, HLD. Presenting with 3 days of left chest pain. It has been a dull, achy pain. It's non-radiating. It comes in 1-minute episodes ever hour or so. She tried ibuprofen to help, but it did not. She had several episodes of diaphoresis yesterday w/ her pain. Her symptoms continued through this morning. She spoke with her PCP. It was recommended that she come to the ED. She denies any other aggravating or alleviating factors.    No lightheadedness/dizziness, no palpitations. Tried motrin. Didn't help.   ED Course: Trp negative x 2. EKG showed brady w/o st elevations. CXR was negative. EDP spoke with cardiology. They recommended admission on heparin gtt to the hospitalist service.  Review of Systems:  Denies dyspnea, palpitations, abdominal pain, N/V/D, lightheadedness, dizziness, palpitations, syncopal episodes.  Review of systems is otherwise negative for all not mentioned in HPI.   PMHx Past Medical History:  Diagnosis Date   DM (diabetes mellitus) ([place])    Essential tremor    head   G6PD deficiency    HTN (hypertension)    Hyperlipidemia     PSHx Past Surgical History:  Procedure Laterality Date   ABDOMINAL HYSTERECTOMY     ANKLE SURGERY Right    x 2   BREAST LUMPECTOMY Left    benign   EYE SURGERY Left    age 37 - L eye removed   REDUCTION MAMMAPLASTY Bilateral    1998    SocHx  reports that she quit smoking about 24 years ago. She has never used smokeless tobacco. She reports current alcohol use. No history on file for drug use.  Allergies  Allergen Reactions   Beeswax Anaphylaxis    bees   Shellfish Allergy Anaphylaxis    And all seafood   Aspirin     G6P Deficiency   Eggs Or Egg-Derived Products Hives    Nsaids     G6P Deficiency   Sulfa Antibiotics     G6P Deficiency     FamHx Family History  Problem Relation Age of Onset   Lung cancer Mother    Colon cancer Father    HIV Brother    HIV Brother     Prior to Admission medications   Medication Sig Start Date End Date Taking? Authorizing Provider  atorvastatin (LIPITOR) 40 MG tablet Take 40 mg by mouth at bedtime. 06/11/20   [provider]  busPIRone (BUSPAR) 7.5 MG tablet Take 7.5 mg by mouth 2 (two) times daily. 06/11/20   [provider]  hydrochlorothiazide (HYDRODIURIL) 25 MG tablet Take 25 mg by mouth daily. 12/27/20   [provider]  hydrochlorothiazide (HYDRODIURIL) 50 MG tablet Take 50 mg by mouth daily. 02/01/20   [provider]  latanoprost (XALATAN) 0.005 % ophthalmic solution 1 drop at bedtime.    [provider]  losartan (COZAAR) 100 MG tablet Take 100 mg by mouth daily.    [provider]  metFORMIN (GLUCOPHAGE) 500 MG tablet Take by mouth 2 (two) times daily with a meal.    [provider]  Potassium Chloride ER 20 MEQ TBCR Take 20 mEq by mouth daily. 02/27/21   [provider]  propranolol ER (INDERAL LA) 120 MG 24 hr capsule Take 120 mg by  mouth daily.    [provider]  SODIUM FLUORIDE, DENTAL RINSE, 0.2 % SOLN  07/13/20   [provider]  timolol (BETIMOL) 0.25 % ophthalmic solution 1-2 drops 2 (two) times daily.    [provider]  timolol (TIMOPTIC) 0.5 % ophthalmic solution Place 1 drop into the right eye every morning. 02/25/21   [provider]  traZODone (DESYREL) 150 MG tablet Take 150 mg by mouth at bedtime. 06/03/20   [provider]    Physical Exam: Vitals:   03/19/21 1240 03/19/21 1241 03/19/21 1245 03/19/21 1400  BP: (!) 151/88  (!) 151/88 132/77  Pulse:  (!) 54 (!) 54 (!) 51  Resp:  20 20 (!) 30  Temp:      TempSrc:      SpO2:  96% 96% 96%  Weight:      Height:         General: 70 y.o. female resting in bed in NAD Eyes: PERRL, normal sclera ENMT: Nares patent w/o discharge, orophaynx clear, dentition normal, ears w/o discharge/lesions/ulcers Neck: Supple, trachea midline Cardiovascular: RRR, +S1, S2, no m/g/r, equal pulses throughout, reproducible chest pain on exam Respiratory: CTABL, no w/r/r, normal WOB GI: BS+, NDNT, no masses noted, no organomegaly noted MSK: No e/c/c Skin: No rashes, bruises, ulcerations noted Neuro: A&O x 3, no focal deficits Psyc: Appropriate interaction and affect, calm/cooperative  Labs on Admission: I have personally reviewed following labs and imaging studies  CBC: Recent Labs  Lab 03/19/21 1109  WBC 7.3  HGB 14.0  HCT 41.6  MCV 96.7  PLT 831   Basic Metabolic Panel: Recent Labs  Lab 03/19/21 1109  NA 136  K 3.7  CL 101  CO2 25  GLUCOSE 214*  BUN 17  CREATININE 0.89  CALCIUM 9.5   GFR: Estimated Creatinine Clearance: 81.9 mL/min (by C-G formula based on SCr of 0.89 mg/dL). Liver Function Tests: No results for input(s): AST, ALT, ALKPHOS, BILITOT, PROT, ALBUMIN in the last 168 hours. No results for input(s): LIPASE, AMYLASE in the last 168 hours. No results for input(s): AMMONIA in the last 168 hours. Coagulation Profile: No results for input(s): INR, PROTIME in the last 168 hours. Cardiac Enzymes: No results for input(s): CKTOTAL, CKMB, CKMBINDEX, TROPONINI in the last 168 hours. BNP (last 3 results) No results for input(s): PROBNP in the last 8760 hours. HbA1C: No results for input(s): HGBA1C in the last 72 hours. CBG: No results for input(s): GLUCAP in the last 168 hours. Lipid Profile: No results for input(s): CHOL, HDL, LDLCALC, TRIG, CHOLHDL, LDLDIRECT in the last 72 hours. Thyroid Function Tests: No results for input(s): TSH, T4TOTAL, FREET4, T3FREE, THYROIDAB in the last 72 hours. Anemia Panel: No results for input(s): VITAMINB12, FOLATE, FERRITIN, TIBC, IRON, RETICCTPCT in the last  72 hours. Urine analysis: No results found for: COLORURINE, APPEARANCEUR, [place], [place], GLUCOSEU, HGBUR, BILIRUBINUR, KETONESUR, PROTEINUR, UROBILINOGEN, NITRITE, LEUKOCYTESUR  Radiological Exams on Admission: DG Chest 2 View  Result Date: 03/19/2021 CLINICAL DATA:  Chest pain for 3 days. EXAM: CHEST - 2 VIEW COMPARISON:  None. FINDINGS: Heart size and mediastinal contours are within normal limits. Lungs are clear. No pleural effusion or pneumothorax is seen. Mild degenerative change within the thoracic spine. No acute-appearing osseous abnormality. IMPRESSION: No active cardiopulmonary disease. No evidence of pneumonia or pulmonary edema. Electronically Signed   By: Franki Cabot M.D.   On: 03/19/2021 11:54    EKG: Independently reviewed. Sinus brady, no st elevations  Assessment/Plan Chest pain     -  admit to inpt, SDU     - heparin gtt     - follow trp     - EKG shows sinus brady w/o st elevation     - check echo     - cardiology consulted by EDP; awaiting final recs  Sinus bradycardia     - asymptomatic     - on propranolol for essential tremor; may need to decrease  DM2     - SSI, DM diet, A1c, glucose check   HTN     - continue home regimen  G6PD     - continue outpt follow up; ok for heparin gtt  HLD     - continue statin  Glaucoma     - continue drops  Essential tremor     - on propranolol  DVT prophylaxis: heparin gtt  Code Status: FULL  Family Communication: None at bedside  Consults called: EDP spoke with cardiology   Status is: Inpatient  Remains inpatient appropriate because:Inpatient level of care appropriate due to severity of illness  Dispo: The patient is from: Home              Anticipated d/c is to: Home              Patient currently is not medically stable to d/c.   Difficult to place patient No  Time spent coordinating admission: 60 minutes  [facility]  If 7PM-7AM, please contact  night-coverage [email or web address]  03/19/2021, 2:32 PM

## 2021-03-19 NOTE — ED Provider Notes (Signed)
[facility] DEPT Provider Note   CSN: [id number] Arrival date & time: 03/19/21  1043     History Chief Complaint  Patient presents with   Chest Pain    Wendy Obrien is a 70 y.o. female.  HPI  HPI: A 70 year old patient with a history of treated diabetes, hypertension, hypercholesterolemia and obesity presents for evaluation of chest pain. Initial onset of pain was more than 6 hours ago. The patient's chest pain is described as heaviness/pressure/tightness and is not worse with exertion. The patient reports some diaphoresis. The patient's chest pain is middle- or left-sided, is not well-localized, is not sharp and does radiate to the arms/jaw/neck. The patient does not complain of nausea. The patient has no history of stroke, has no history of peripheral artery disease, has not smoked in the past 90 days and has no relevant family history of coronary artery disease (first degree relative at less than age 74).    Patient reports first episode of chest pain was about 3 days ago.  She was doing dishes and she got a central chest discomfort that radiated into her shoulder blades.  She reports she felt a little sweaty with it as well.  She reports since that time she has been getting episodes of some chest discomfort with radiation around towards her lower ribs and shoulder blades.  She reports it is coming and going and resolving on its own.  She does not currently have pain.  She reports however she is also noticing that she is breaking out in a sweat a little more frequently.  She reports she is usually very physically active but is starting to notice that she is getting more easily fatigued.  She reports she is noting a little bit more swelling around her feet and ankles.  No calf pain.  No fever no cough.  No generalized body ache.  Up-to-date with COVID vaccines.  Family history: Patient's father had coronary artery disease with sudden death in his early  81s.  Patient does not smoke or drink alcohol. Past Medical History:  Diagnosis Date   DM (diabetes mellitus) ([place])    Essential tremor    head   G6PD deficiency    HTN (hypertension)    Hyperlipidemia     Patient Active Problem List   Diagnosis Date Noted   Chronic pain of both ankles 11/04/2019    Past Surgical History:  Procedure Laterality Date   ABDOMINAL HYSTERECTOMY     ANKLE SURGERY Right    x 2   BREAST LUMPECTOMY Left    benign   EYE SURGERY Left    age 84 - L eye removed   REDUCTION MAMMAPLASTY Bilateral    1998     OB History   No obstetric history on file.     Family History  Problem Relation Age of Onset   Lung cancer Mother    Colon cancer Father    HIV Brother    HIV Brother     Social History   Tobacco Use   Smoking status: Former    Pack years: 0.00    Types: Cigarettes    Quit date: 1998    Years since quitting: 24.5   Smokeless tobacco: Never  Vaping Use   Vaping Use: Never used  Substance Use Topics   Alcohol use: Yes    Comment: occasional wine on holiday    Home Medications Prior to Admission medications   Medication Sig Start Date End Date Taking?  Authorizing Provider  atorvastatin (LIPITOR) 40 MG tablet Take 40 mg by mouth at bedtime. 06/11/20   [provider]  busPIRone (BUSPAR) 7.5 MG tablet Take 7.5 mg by mouth 2 (two) times daily. 06/11/20   [provider]  hydrochlorothiazide (HYDRODIURIL) 25 MG tablet Take 25 mg by mouth daily. 12/27/20   [provider]  hydrochlorothiazide (HYDRODIURIL) 50 MG tablet Take 50 mg by mouth daily. 02/01/20   [provider]  latanoprost (XALATAN) 0.005 % ophthalmic solution 1 drop at bedtime.    [provider]  losartan (COZAAR) 100 MG tablet Take 100 mg by mouth daily.    [provider]  metFORMIN (GLUCOPHAGE) 500 MG tablet Take by mouth 2 (two) times daily with a meal.    [provider]  Potassium Chloride ER 20 MEQ TBCR  Take 20 mEq by mouth daily. 02/27/21   [provider]  propranolol ER (INDERAL LA) 120 MG 24 hr capsule Take 120 mg by mouth daily.    [provider]  SODIUM FLUORIDE, DENTAL RINSE, 0.2 % SOLN  07/13/20   [provider]  timolol (BETIMOL) 0.25 % ophthalmic solution 1-2 drops 2 (two) times daily.    [provider]  timolol (TIMOPTIC) 0.5 % ophthalmic solution Place 1 drop into the right eye every morning. 02/25/21   [provider]  traZODone (DESYREL) 150 MG tablet Take 150 mg by mouth at bedtime. 06/03/20   [provider]    Allergies    Beeswax, Shellfish allergy, Aspirin, Eggs or egg-derived products, Nsaids, and Sulfa antibiotics  Review of Systems   Review of Systems 10 Systems reviewed and negative except as per HPI Physical Exam Updated Vital Signs BP (!) 151/88 (BP Location: Right Arm)   Pulse (!) 54   Temp 98.7 F (37.1 C) (Oral)   Resp 20   Ht 5\' 8"  (1.727 m)   Wt 124.7 kg   SpO2 96%   BMI 41.81 kg/m   Physical Exam Constitutional:      Appearance: Normal appearance.  HENT:     Mouth/Throat:     Pharynx: Oropharynx is clear.  Cardiovascular:     Rate and Rhythm: Normal rate and regular rhythm.  Pulmonary:     Effort: Pulmonary effort is normal.     Breath sounds: Normal breath sounds.  Abdominal:     General: There is no distension.     Palpations: Abdomen is soft.     Tenderness: There is no abdominal tenderness. There is no guarding.  Musculoskeletal:     Cervical back: Neck supple.     Comments: No calf tenderness.  Patient has some chronic swelling of the ankle joint themselves from old surgical treatment.  No erythema or tenderness.  Very subtle edema over the dorsum of each foot.  Skin:    General: Skin is warm and dry.  Neurological:     General: No focal deficit present.     Mental Status: She is oriented to person, place, and time.     Motor: No weakness.     Coordination: Coordination normal.   Psychiatric:        Mood and Affect: Mood normal.    ED Results / Procedures / Treatments   Labs (all labs ordered are listed, but only abnormal results are displayed) Labs Reviewed  BASIC METABOLIC PANEL - Abnormal; Notable for the following components:      Result Value   Glucose, Bld 214 (*)    All  other components within normal limits  RESP PANEL BY RT-PCR (FLU A&B, COVID) ARPGX2  CBC  TROPONIN I (HIGH SENSITIVITY)  TROPONIN I (HIGH SENSITIVITY)    EKG EKG Interpretation  Date/Time:  Saturday March 19 2021 10:52:04 EDT Ventricular Rate:  50 PR Interval:  198 QRS Duration: 84 QT Interval:  434 QTC Calculation: 395 R Axis:   -10 Text Interpretation: Sinus bradycardia Nonspecific T wave abnormality Abnormal ECG agree, no STEMI, no old comparison Confirmed by Charlesetta Shanks [phone]) on 03/19/2021 12:19:51 PM  Radiology DG Chest 2 View  Result Date: 03/19/2021 CLINICAL DATA:  Chest pain for 3 days. EXAM: CHEST - 2 VIEW COMPARISON:  None. FINDINGS: Heart size and mediastinal contours are within normal limits. Lungs are clear. No pleural effusion or pneumothorax is seen. Mild degenerative change within the thoracic spine. No acute-appearing osseous abnormality. IMPRESSION: No active cardiopulmonary disease. No evidence of pneumonia or pulmonary edema. Electronically Signed   By: Franki Cabot M.D.   On: 03/19/2021 11:54    Procedures Procedures  CRITICAL CARE Performed by: Charlesetta Shanks   Total critical care time: 30 minutes  Critical care time was exclusive of separately billable procedures and treating other patients.  Critical care was necessary to treat or prevent imminent or life-threatening deterioration.  Critical care was time spent personally by me on the following activities: development of treatment plan with patient and/or surrogate as well as nursing, discussions with consultants, evaluation of patient's response to treatment, examination of patient, obtaining  history from patient or surrogate, ordering and performing treatments and interventions, ordering and review of laboratory studies, ordering and review of radiographic studies, pulse oximetry and re-evaluation of patient's condition.  Medications Ordered in ED Medications - No data to display  ED Course  I have reviewed the triage vital signs and the nursing notes.  Pertinent labs & imaging results that were available during my care of the patient were reviewed by me and considered in my medical decision making (see chart for details).    MDM Rules/Calculators/A&P HEAR Score: 7                        Patient presents with chest pain history concerning for angina.  Patient does have significant risk factors.  EKG does not show STEMI but nonspecific T wave abnormality without old comparison available.  Cardiology consultation recommends admission on heparin for further evaluation.  Final Clinical Impression(s) / ED Diagnoses Final diagnoses:  ACS (acute coronary syndrome) [facility])    Rx / DC Orders ED Discharge Orders     None        Charlesetta Shanks, MD 04/03/21 1050

## 2021-03-19 NOTE — Progress Notes (Signed)
ANTICOAGULATION CONSULT NOTE - Initial Consult  Pharmacy Consult for Heparin Indication: ACS  Allergies  Allergen Reactions   Beeswax Anaphylaxis    bees   Shellfish Allergy Anaphylaxis    And all seafood   Aspirin     G6P Deficiency   Eggs Or Egg-Derived Products Hives   Nsaids     G6P Deficiency   Sulfa Antibiotics     G6P Deficiency     Patient Measurements: Height: 5\' 8"  (172.7 cm) Weight: 124.7 kg (275 lb) IBW/kg (Calculated) : 63.9 Heparin Dosing Weight: 93 kg  Vital Signs: Temp: 98.7 F (37.1 C) (07/09 1058) Temp Source: Oral (07/09 1058) BP: 132/77 (07/09 1400) Pulse Rate: 51 (07/09 1400)  Labs: Recent Labs    03/19/21 1109 03/19/21 1341  HGB 14.0  --   HCT 41.6  --   PLT 219  --   CREATININE 0.89  --   TROPONINIHS 4 3    Estimated Creatinine Clearance: 81.9 mL/min (by C-G formula based on SCr of 0.89 mg/dL).   Medical History: Past Medical History:  Diagnosis Date   DM (diabetes mellitus) ([place])    Essential tremor    head   G6PD deficiency    HTN (hypertension)    Hyperlipidemia     Medications:  Scheduled:  Infusions:   Assessment: Wendy Obrien presented to ED on 7/9 with chest pain, first started 3 days ago.  Pharmacy is consulted to dose Heparin for ACS.  No prior to admission anticoagulants. Baseline coags pending CBC: Hgb and Plt WNL Troponin: 4, 3  Goal of Therapy:  Heparin level 0.3-0.7 units/ml Monitor platelets by anticoagulation protocol: Yes   Plan:  Give heparin 4000 units bolus IV x 1 Start heparin IV infusion at 1000 units/hr Heparin level 6 hours after starting Daily heparin level and CBC   Gretta Arab PharmD, BCPS Clinical Pharmacist [facility] main pharmacy [phone] 03/19/2021 3:00 PM

## 2021-03-19 NOTE — Progress Notes (Signed)
ANTICOAGULATION CONSULT NOTE - Initial Consult  Pharmacy Consult for Heparin Indication: ACS  Allergies  Allergen Reactions   Bee Venom Anaphylaxis   Beeswax Anaphylaxis    bees   Fish Allergy Anaphylaxis   Shellfish Allergy Anaphylaxis    And all seafood   Aspirin Other (See Comments)    G6P Deficiency   Eggs Or Egg-Derived Products Hives   Nsaids Other (See Comments)    G6P Deficiency   Sulfa Antibiotics Other (See Comments)    G6P Deficiency     Patient Measurements: Height: 5\' 8"  (172.7 cm) Weight: 124.7 kg (275 lb) IBW/kg (Calculated) : 63.9 Heparin Dosing Weight: 93 kg  Vital Signs: Temp: 98.7 F (37.1 C) (07/09 1058) Temp Source: Oral (07/09 1058) BP: 162/80 (07/09 2100) Pulse Rate: 48 (07/09 2100)  Labs: Recent Labs    03/19/21 1109 03/19/21 1341 03/19/21 2000  HGB 14.0  --   --   HCT 41.6  --   --   PLT 219  --   --   APTT 28  --   --   LABPROT 13.2  --   --   INR 1.0  --   --   HEPARINUNFRC  --   --  >1.10*  CREATININE 0.89  --   --   TROPONINIHS 4 3  --      Estimated Creatinine Clearance: 81.9 mL/min (by C-G formula based on SCr of 0.89 mg/dL).   Medical History: Past Medical History:  Diagnosis Date   DM (diabetes mellitus) ([place])    Essential tremor    head   G6PD deficiency    HTN (hypertension)    Hyperlipidemia     Medications:  Scheduled:  Infusions:   Assessment: 24 yoF presented to ED on 7/9 with chest pain, first started 3 days ago.  Pharmacy is consulted to dose Heparin for ACS.  No prior to admission anticoagulants. Baseline coags pending CBC: Hgb and Plt WNL Troponin: 4, 3  Goal of Therapy:  Heparin level 0.3-0.7 units/ml Monitor platelets by anticoagulation protocol: Yes  Today, 03/19/2021 Initiated Heparin with 4000 unit bolus, infusion started at 1000 units/hr 6 hr Heparin level > 1.1, but sample drawn from the IV line that Heparin infusing into (RN noted she flushed the line, then drew level)   Plan:   Repeat Heparin level, via venipuncture in opposite arm Daily CBC ordered, plan daily Heparin level at steady state  Minda Ditto PharmD 03/19/2021, 10:06 PM

## 2021-03-19 NOTE — Progress Notes (Signed)
ANTICOAGULATION CONSULT NOTE  Pharmacy Consult for Heparin Indication: ACS  Allergies  Allergen Reactions   Bee Venom Anaphylaxis   Beeswax Anaphylaxis    bees   Fish Allergy Anaphylaxis   Shellfish Allergy Anaphylaxis    And all seafood   Aspirin Other (See Comments)    G6P Deficiency   Eggs Or Egg-Derived Products Hives   Nsaids Other (See Comments)    G6P Deficiency   Sulfa Antibiotics Other (See Comments)    G6P Deficiency     Patient Measurements: Height: 5\' 8"  (172.7 cm) Weight: 124.7 kg (275 lb) IBW/kg (Calculated) : 63.9 Heparin Dosing Weight: 93 kg  Vital Signs: BP: 146/76 (07/09 2300) Pulse Rate: 57 (07/09 2300)  Labs: Recent Labs    03/19/21 1109 03/19/21 1341 03/19/21 2000 03/19/21 2230  HGB 14.0  --   --   --   HCT 41.6  --   --   --   PLT 219  --   --   --   APTT 28  --   --   --   LABPROT 13.2  --   --   --   INR 1.0  --   --   --   HEPARINUNFRC  --   --  >1.10* <0.10*  CREATININE 0.89  --   --   --   TROPONINIHS 4 3  --   --      Estimated Creatinine Clearance: 81.9 mL/min (by C-G formula based on SCr of 0.89 mg/dL).   Medical History: Past Medical History:  Diagnosis Date   DM (diabetes mellitus) ([place])    Essential tremor    head   G6PD deficiency    HTN (hypertension)    Hyperlipidemia     Medications:  Scheduled:  Infusions:   Assessment: 33 yoF presented to ED on 7/9 with chest pain, first started 3 days ago.  Pharmacy is consulted to dose Heparin for ACS.  No prior to admission anticoagulants. Baseline coags WNL; CBC: Hgb and Plt WNL  03/19/2021: Repeat heparin level drawn peripherally from opposite arm <0.1- SUBtherpaeutic on IV heparin 1000 units/hr CBC WNL No bleeding or infusion related concerns per RN  Goal of Therapy:  Heparin level 0.3-0.7 units/ml Monitor platelets by anticoagulation protocol: Yes   Plan:  Rebolus heparin 2000 units IV x1 then increase heparin infusion to 1400 units/hr Check 6h heparin  level after rate increase Daily heparin level & CBC while on heparin  Netta Cedars PharmD 03/19/2021, 11:47 PM

## 2021-03-20 ENCOUNTER — Encounter (HOSPITAL_COMMUNITY): Payer: Self-pay | Admitting: Internal Medicine

## 2021-03-20 ENCOUNTER — Inpatient Hospital Stay (HOSPITAL_BASED_OUTPATIENT_CLINIC_OR_DEPARTMENT_OTHER): Payer: Medicare Other

## 2021-03-20 ENCOUNTER — Ambulatory Visit (HOSPITAL_BASED_OUTPATIENT_CLINIC_OR_DEPARTMENT_OTHER)
Admit: 2021-03-20 | Discharge: 2021-03-20 | Disposition: A | Discharge status: Home / Self Care | Payer: Medicare Other | Attending: Cardiovascular Disease | Admitting: Cardiovascular Disease

## 2021-03-20 DIAGNOSIS — I10 Essential (primary) hypertension: Secondary | ICD-10-CM | POA: Insufficient documentation

## 2021-03-20 DIAGNOSIS — E782 Mixed hyperlipidemia: Secondary | ICD-10-CM | POA: Diagnosis not present

## 2021-03-20 DIAGNOSIS — R079 Chest pain, unspecified: Secondary | ICD-10-CM

## 2021-03-20 DIAGNOSIS — I249 Acute ischemic heart disease, unspecified: Secondary | ICD-10-CM | POA: Diagnosis not present

## 2021-03-20 DIAGNOSIS — E785 Hyperlipidemia, unspecified: Secondary | ICD-10-CM | POA: Insufficient documentation

## 2021-03-20 DIAGNOSIS — R0789 Other chest pain: Secondary | ICD-10-CM

## 2021-03-20 LAB — NM MYOCAR MULTI W/SPECT W/WALL MOTION / EF
Estimated workload: 1 METS
Exercise duration (min): 0 min
Exercise duration (sec): 0 s
MPHR: 150 {beats}/min
Peak HR: 112 {beats}/min
Percent HR: 74 %
RPE: 0
Rest HR: 58 {beats}/min

## 2021-03-20 LAB — ECHOCARDIOGRAM COMPLETE
AR max vel: 2.32 cm2
AV Area VTI: 2.1 cm2
AV Area mean vel: 2.16 cm2
AV Mean grad: 5 mmHg
AV Peak grad: 9.5 mmHg
Ao pk vel: 1.54 m/s
Area-P 1/2: 2.68 cm2
Calc EF: 68.8 %
Height: 68 in
MV M vel: 6.09 m/s
MV Peak grad: 148.4 mmHg
P 1/2 time: 659 msec
Radius: 0.4 cm
S' Lateral: 2.7 cm
Single Plane A2C EF: 68.2 %
Single Plane A4C EF: 69.2 %
Weight: 4400 oz

## 2021-03-20 LAB — CBG MONITORING, ED: Glucose-Capillary: 146 mg/dL — ABNORMAL HIGH (ref 70–99)

## 2021-03-20 LAB — LIPID PANEL
Cholesterol: 99 mg/dL (ref 0–200)
HDL: 36 mg/dL — ABNORMAL LOW (ref >40)
LDL Cholesterol: 17 mg/dL (ref 0–99)
Total CHOL/HDL Ratio: 2.8 RATIO
Triglycerides: 228 mg/dL — ABNORMAL HIGH (ref <150)
VLDL: 46 mg/dL — ABNORMAL HIGH (ref 0–40)

## 2021-03-20 LAB — CBC
HCT: 39.3 % (ref 36.0–46.0)
Hemoglobin: 12.9 g/dL (ref 12.0–15.0)
MCH: 32.3 pg (ref 26.0–34.0)
MCHC: 32.8 g/dL (ref 30.0–36.0)
MCV: 98.3 fL (ref 80.0–100.0)
Platelets: 179 10*3/uL (ref 150–400)
RBC: 4 MIL/uL (ref 3.87–5.11)
RDW: 11.6 % (ref 11.5–15.5)
WBC: 9.1 10*3/uL (ref 4.0–10.5)
nRBC: 0 % (ref 0.0–0.2)

## 2021-03-20 LAB — HEMOGLOBIN A1C
Hgb A1c MFr Bld: 5.9 % — ABNORMAL HIGH (ref 4.8–5.6)
Mean Plasma Glucose: 122.63 mg/dL

## 2021-03-20 LAB — HEPARIN LEVEL (UNFRACTIONATED)
Heparin Unfractionated: 0.45 IU/mL (ref 0.30–0.70)
Heparin Unfractionated: 0.71 IU/mL — ABNORMAL HIGH (ref 0.30–0.70)

## 2021-03-20 LAB — MRSA NEXT GEN BY PCR, NASAL: MRSA by PCR Next Gen: NOT DETECTED

## 2021-03-20 LAB — HIV ANTIBODY (ROUTINE TESTING W REFLEX): HIV Screen 4th Generation wRfx: NONREACTIVE

## 2021-03-20 LAB — TROPONIN I (HIGH SENSITIVITY): Troponin I (High Sensitivity): 4 ng/L (ref <18)

## 2021-03-20 MED ORDER — TRAZODONE HCL 50 MG PO TABS: 75.0000 mg | ORAL_TABLET | Freq: Every day | ORAL | Status: DC

## 2021-03-20 MED ORDER — BUSPIRONE HCL 5 MG PO TABS
7.5000 mg | ORAL_TABLET | Freq: Every day | ORAL | Status: DC
  Administered 2021-03-20: 7.5 mg via ORAL
  Filled 2021-03-20: qty 1.5

## 2021-03-20 MED ORDER — FUROSEMIDE 20 MG PO TABS: 20.0000 mg | ORAL_TABLET | Freq: Every day | ORAL | Status: DC | PRN

## 2021-03-20 MED ORDER — ATORVASTATIN CALCIUM 40 MG PO TABS
40.0000 mg | ORAL_TABLET | Freq: Every day | ORAL | Status: DC
  Administered 2021-03-20: 40 mg via ORAL
  Filled 2021-03-20: qty 1

## 2021-03-20 MED ORDER — TECHNETIUM TC 99M TETROFOSMIN IV KIT
29.0000 | PACK | Freq: Once | INTRAVENOUS | Status: AC | PRN
  Administered 2021-03-20: 29 via INTRAVENOUS

## 2021-03-20 MED ORDER — LOSARTAN POTASSIUM 50 MG PO TABS
100.0000 mg | ORAL_TABLET | Freq: Every day | ORAL | Status: DC
  Administered 2021-03-20: 100 mg via ORAL
  Filled 2021-03-20: qty 4

## 2021-03-20 MED ORDER — REGADENOSON 0.4 MG/5ML IV SOLN: 0.4000 mg | Freq: Once | INTRAVENOUS | Status: AC

## 2021-03-20 MED ORDER — HYDROCHLOROTHIAZIDE 25 MG PO TABS
25.0000 mg | ORAL_TABLET | Freq: Every day | ORAL | Status: DC
  Administered 2021-03-20: 25 mg via ORAL
  Filled 2021-03-20: qty 1

## 2021-03-20 MED ORDER — TECHNETIUM TC 99M TETROFOSMIN IV KIT
11.0000 | PACK | Freq: Once | INTRAVENOUS | Status: AC | PRN
  Administered 2021-03-20: 11 via INTRAVENOUS

## 2021-03-20 MED ORDER — CHLORHEXIDINE GLUCONATE CLOTH 2 % EX PADS
6.0000 | MEDICATED_PAD | Freq: Every day | CUTANEOUS | Status: DC
  Administered 2021-03-20: 6 via TOPICAL

## 2021-03-20 MED ORDER — HYDRALAZINE HCL 25 MG PO TABS: 25.0000 mg | ORAL_TABLET | Freq: Four times a day (QID) | ORAL | Status: DC | PRN

## 2021-03-20 MED ORDER — REGADENOSON 0.4 MG/5ML IV SOLN
INTRAVENOUS | Status: AC
  Administered 2021-03-20: 0.4 mg via INTRAVENOUS
  Filled 2021-03-20: qty 5

## 2021-03-20 MED ORDER — PROPRANOLOL HCL ER 120 MG PO CP24
120.0000 mg | ORAL_CAPSULE | Freq: Every day | ORAL | Status: DC
  Filled 2021-03-20: qty 1

## 2021-03-20 NOTE — Care Management CC44 (Signed)
Condition Code 44 Documentation Completed  Patient Details  Name: Wendy Obrien MRN: [id number] Date of Birth: 06-26-1951   Condition Code 44 given:  Yes Patient signature on Condition Code 44 notice:  Yes Documentation of 2 MD's agreement:  Yes Code 44 added to claim:  Yes    Alyssa Mancera, LCSW 03/20/2021, 4:13 PM

## 2021-03-20 NOTE — Consult Note (Signed)
Cardiology Consultation:   Patient ID: Wendy Obrien MRN: [id number]; DOB: 11/09/1950  Admit date: 03/19/2021 Date of Consult: 03/20/2021  PCP:  Rocco Serene, MD   [facility] Providers Cardiologist:  None        Patient Profile:   Wendy Obrien is a 70 y.o. female with a hx of diabetes mellitus, hypertension, hyperlipidemia who is being seen 03/20/2021 for the evaluation of chest discomfort at the request of Dr. Marylyn Ishihara.Marland Kitchen  History of Present Illness:   Wendy Obrien is a 70 year old female with history of diabetes, hypertension and hyperlipidemia.  She presented to the emergency room yesterday with 3 days of chest pain.  It was dull achy pain.  It was intermittently and would come in 1 minute intervals every hour or so.   The chest pain is described as midsternal.  It last for about a minute or so.  It radiates to between her shoulder blades. She also has some palpitations that tend to make her cough.  Clinically these sound like premature ventricular contractions. She was getting an echocardiogram while I examined her today.  She was having PVCs on the echo telemetry.  She also had long runs of bigeminy.  Troponin levels are negative. ECG reveals sinus bradycardia at 50 beats a minute.  She has T wave versions in lead V3.  She has a severe allergy to iodine.  We considered doing a coronary CT angiogram but I think she will need a full day of premedications if we need to give her IV contrast. She is currently pain-free.  Past Medical History:  Diagnosis Date   DM (diabetes mellitus) ([place])    Essential tremor    head   G6PD deficiency    HTN (hypertension)    Hyperlipidemia     Past Surgical History:  Procedure Laterality Date   ABDOMINAL HYSTERECTOMY     ANKLE SURGERY Right    x 2   BREAST LUMPECTOMY Left    benign   EYE SURGERY Left    age 20 - L eye removed   REDUCTION MAMMAPLASTY Bilateral    1998     Home Medications:  Prior to Admission medications    Medication Sig Start Date End Date Taking? Authorizing Provider  acetaminophen (TYLENOL) 500 MG tablet Take 500-1,000 mg by mouth daily as needed for headache (pain).   Yes [provider]  atorvastatin (LIPITOR) 40 MG tablet Take 40 mg by mouth daily with breakfast. 06/11/20  Yes [provider]  BETA CAROTENE PO Take 1 tablet by mouth daily with breakfast.   Yes [provider]  busPIRone (BUSPAR) 7.5 MG tablet Take 7.5 mg by mouth daily with breakfast. 06/11/20  Yes [provider]  EVENING PRIMROSE OIL PO Take 1 capsule by mouth daily with breakfast.   Yes [provider]  furosemide (LASIX) 20 MG tablet Take 20 mg by mouth daily as needed (feet swelling). 12/02/20  Yes [provider]  hydrochlorothiazide (HYDRODIURIL) 25 MG tablet Take 25 mg by mouth daily with breakfast. 12/27/20  Yes [provider]  ibuprofen (ADVIL) 600 MG tablet Take 600 mg by mouth at bedtime as needed (pain).   Yes [provider]  latanoprost (XALATAN) 0.005 % ophthalmic solution Place 1 drop into the right eye at bedtime.   Yes [provider]  losartan (COZAAR) 100 MG tablet Take 100 mg by mouth daily with breakfast.   Yes [provider]  Magnesium 300 MG CAPS Take 300 mg by mouth  daily with breakfast.   Yes [provider]  metFORMIN (GLUCOPHAGE) 500 MG tablet Take 500 mg by mouth daily with breakfast.   Yes [provider]  Misc Natural Products (NEURIVA) CAPS Take 1 capsule by mouth daily with breakfast.   Yes [provider]  Multiple Vitamin (MULTIVITAMIN WITH MINERALS) TABS tablet Take 1 tablet by mouth daily with breakfast.   Yes [provider]  Multiple Vitamins-Minerals (HAIR/SKIN/NAILS/BIOTIN) TABS Take 1 tablet by mouth daily with breakfast.   Yes [provider]  Omega-3 Fatty Acids (FISH OIL) 1200 MG CAPS Take 1,200 mg by mouth daily with breakfast.   Yes [provider]  Potassium Chloride ER 20 MEQ TBCR Take 20 mEq by mouth daily as needed (with each dose of Lasix). 02/27/21  Yes [provider]  propranolol ER (INDERAL LA) 120 MG 24 hr capsule Take 120 mg by mouth at bedtime.   Yes [provider]  SODIUM FLUORIDE, DENTAL RINSE, 0.2 % SOLN Place 1 application onto teeth every 14 (fourteen) days. 07/13/20  Yes [provider]  timolol (TIMOPTIC) 0.5 % ophthalmic solution Place 1 drop into the right eye every morning. 02/25/21  Yes [provider]  traZODone (DESYREL) 150 MG tablet Take 75 mg by mouth at bedtime. 06/03/20  Yes [provider]    Inpatient Medications: Scheduled Meds:  atorvastatin  40 mg Oral Q breakfast   busPIRone  7.5 mg Oral Q breakfast   hydrochlorothiazide  25 mg Oral Q breakfast   insulin aspart  0-15 Units Subcutaneous TID WC   insulin aspart  0-5 Units Subcutaneous QHS   losartan  100 mg Oral Q breakfast   propranolol ER  120 mg Oral QHS   traZODone  75 mg Oral QHS   Continuous Infusions:  heparin 1,400 Units/hr (03/20/21 0021)   PRN Meds: acetaminophen, furosemide, ondansetron (ZOFRAN) IV  Allergies:    Allergies  Allergen Reactions   Bee Venom Anaphylaxis   Beeswax Anaphylaxis    bees   Fish Allergy Anaphylaxis   Shellfish Allergy Anaphylaxis    And all seafood   Aspirin Other (See Comments)    G6P Deficiency   Eggs Or Egg-Derived Products Hives   Nsaids Other (See Comments)    G6P Deficiency   Sulfa Antibiotics Other (See Comments)    G6P Deficiency     Social History:   Social History   Socioeconomic History   Marital status: Married    Spouse name: Not on file   Number of children: Not on file   Years of education: Not on file   Highest education level: Not on file  Occupational History   Not on file  Tobacco Use   Smoking status: Former    Pack years: 0.00    Types: Cigarettes    Quit date: 1998    Years since quitting: 24.5   Smokeless  tobacco: Never  Vaping Use   Vaping Use: Never used  Substance and Sexual Activity   Alcohol use: Yes    Comment: occasional wine on holiday   Drug use: Not on file   Sexual activity: Yes  Other Topics Concern   Not on file  Social History Narrative   Not on file   Social Determinants of Health   Financial Resource Strain: Not on file  Food Insecurity: Not on file  Transportation Needs: Not on file  Physical Activity: Not on file  Stress: Not on file  Social Connections: Not on file  Intimate Partner Violence: Not on file    Family History:    Family History  Problem Relation Age of Onset   Lung cancer Mother    Colon cancer Father    HIV Brother    HIV Brother      ROS:  Please see the history of present illness.   All other ROS reviewed and negative.     Physical Exam/Data:   Vitals:   03/20/21 0200 03/20/21 0230 03/20/21 0300 03/20/21 0653  BP: 132/87 (!) 117/53 (!) 126/55 (!) 144/97  Pulse: (!) 52 (!) 48 (!) 47 (!) 54  Resp: 16 14 20  (!) 23  Temp:      TempSrc:      SpO2: 94% 93% 91% 96%  Weight:      Height:       No intake or output data in the 24 hours ending 03/20/21 0816 Last 3 Weights 03/19/2021 07/25/2019  Weight (lbs) 275 lb 276 lb 12.8 oz  Weight (kg) 124.739 kg 125.556 kg     Body mass index is 41.81 kg/m.  General:   elderly female,  moderately obese  HEENT: normal Lymph: no adenopathy Neck: no JVD Endocrine:  No thryomegaly Vascular: No carotid bruits; FA pulses 2+ bilaterally without bruits  Cardiac:  normal S1, S2; RRR;  soft systolic murmur , n ochest wall tenderness  Lungs:  clear to auscultation bilaterally, no wheezing, rhonchi or rales  Abd: soft, nontender, no hepatomegaly  Ext: no edema Musculoskeletal:  No deformities, BUE and BLE strength normal and equal Skin: warm and dry  Neuro:  CNs 2-12 intact, no focal abnormalities noted Psych:  Normal affect   EKG:  The EKG was personally reviewed and demonstrates:  sinus brady  at 50.  TWI in V3 ( possibly normal variant)  no old ECG to compare  Telemetry:  Telemetry was personally reviewed and demonstrates:  NSR, frequent PVCs, bigeminy   Relevant CV Studies:   Laboratory Data:  High Sensitivity Troponin:   Recent Labs  Lab 03/19/21 1109 03/19/21 1341 03/20/21 0020  TROPONINIHS 4 3 4      Chemistry Recent Labs  Lab 03/19/21 1109  NA 136  K 3.7  CL 101  CO2 25  GLUCOSE 214*  BUN 17  CREATININE 0.89  CALCIUM 9.5  GFRNONAA >60  ANIONGAP 10    No results for input(s): PROT, ALBUMIN, AST, ALT, ALKPHOS, BILITOT in the last 168 hours. Hematology Recent Labs  Lab 03/19/21 1109 03/20/21 0630  WBC 7.3 9.1  RBC 4.30 4.00  HGB 14.0 12.9  HCT 41.6 39.3  MCV 96.7 98.3  MCH 32.6 32.3  MCHC 33.7 32.8  RDW 11.7 11.6  PLT 219 179   BNPNo results for input(s): BNP, PROBNP in the last 168 hours.  DDimer No results for input(s): DDIMER in the last 168 hours.   Radiology/Studies:  DG Chest 2 View  Result Date: 03/19/2021 CLINICAL DATA:  Chest pain for 3 days. EXAM: CHEST - 2 VIEW COMPARISON:  None. FINDINGS: Heart size and mediastinal contours are within normal limits. Lungs are clear. No pleural effusion or pneumothorax is seen. Mild degenerative change within the thoracic spine. No acute-appearing osseous abnormality. IMPRESSION: No active cardiopulmonary disease. No evidence of pneumonia or pulmonary edema. Electronically Signed   By: Franki Cabot M.D.   On: 03/19/2021 11:54     Assessment and Plan:   Chest pain : Patient presents with several days of intermittent chest pain.  These episodes occurred several days  after she recovered from [place].  The chest pains are not associated with eating, drinking, change of position, taking a deep breath, or exercise.  The episodes last for about a minute and then spontaneously resolved.  She does have risk factors for coronary artery disease including hypertension, diabetes, hyperlipidemia.  We consider doing  a coronary CT angiogram but she has a severe allergy to seafood and I suspect that if we need to give her IV contrast we should probably premedicate her at least starting that 24 hours prior to the study.  We will send her to [facility] for a [employer] study. We discussed the risk, benefits, options of the test.  She understands and agrees to proceed.  2.  Hypertension: Continue current medications.  3.  Hyperlipidemia:  chol is 99, HDL I s36,  LDL is 17,  trigs are 228.   Continue atorvastatin    Risk Assessment/Risk Scores:    HEAR Score (for undifferentiated chest pain):  HEAR Score: 7{          For questions or updates, please contact [place] Please consult [email or web address] for contact info under    Signed, Mertie Moores, MD  03/20/2021 8:16 AM

## 2021-03-20 NOTE — Discharge Summary (Signed)
Physician Discharge Summary  Wendy Obrien KDT:[id number] DOB: [birth year] DOA: 03/19/2021  PCP: Rocco Serene, MD  Admit date: 03/19/2021 Discharge date: 03/20/2021  Admitted From: Home Disposition: Home  Recommendations for Outpatient Follow-up:  Follow up with PCP in 1-2 weeks   Home Health: Not applicable Equipment/Devices: Not applicable  Discharge Condition: Stable CODE STATUS: Full code Diet recommendation: Low-salt, low-carb diet  Discharge summary: 70 year old active female with history of type 2 diabetes on oral hypoglycemics, hypertension and hyperlipidemia presented with 3 days of intermittent left precordial chest pain not relieved by tylenol at home.  In the emergency room serial troponins negative.  EKG showed sinus bradycardia with some ST inversions.  Chest x-ray normal.  Due to nature of chest pain, patient was started on heparin infusion and admitted to the hospital with cardiology consultation. Patient had developed mild COVID infection 3 weeks ago without any cough or shortness of breath.  Improved spontaneously.  Chest pain: Probably musculoskeletal pain.  Less likely pleurisy or pericarditis due to recent COVID-19 infection.  Symptomatically improved. Acute coronary syndrome was ruled out.  She is chest pain-free since admission. Serial troponins negative.  EKG nonischemic.  2D echocardiogram with normal ejection fraction and no wall motion abnormalities. Patient underwent Myoview scan with no ischemic changes and low risk of coronary artery disease.  Since patient is asymptomatic today, she will be discharged home with outpatient follow-up. No change in therapy needed. Blood pressures were elevated, however controlled with resuming home medications. She can resume her metformin.      Discharge Diagnoses:  Active Problems:   Chest pain    Discharge Instructions  Discharge Instructions     Call MD for:   Complete by: As directed    Recurrent and any  worsening pain   Call MD for:  difficulty breathing, headache or visual disturbances   Complete by: As directed    Call MD for:  extreme fatigue   Complete by: As directed    Call MD for:  persistant dizziness or light-headedness   Complete by: As directed    Diet - low sodium heart healthy   Complete by: As directed    Diet Carb Modified   Complete by: As directed    Discharge instructions   Complete by: As directed    Visit Emergency room or your primary care physician for any recurrent chest pain, shortness of breath/ wheezing/ leg swelling or any unusual symptoms. Can use tylenol or Ibuprofen for pain.   Increase activity slowly   Complete by: As directed       Allergies as of 03/20/2021       Reactions   Bee Venom Anaphylaxis   Beeswax Anaphylaxis   bees   Fish Allergy Anaphylaxis   Shellfish Allergy Anaphylaxis   And all seafood   Aspirin Other (See Comments)   G6P Deficiency   Eggs Or Egg-derived Products Hives   Nsaids Other (See Comments)   G6P Deficiency   Sulfa Antibiotics Other (See Comments)   G6P Deficiency         Medication List     TAKE these medications    acetaminophen 500 MG tablet Commonly known as: TYLENOL Take 500-1,000 mg by mouth daily as needed for headache (pain).   atorvastatin 40 MG tablet Commonly known as: LIPITOR Take 40 mg by mouth daily with breakfast.   BETA CAROTENE PO Take 1 tablet by mouth daily with breakfast.   busPIRone 7.5 MG tablet Commonly known as: BUSPAR Take 7.5  mg by mouth daily with breakfast.   EVENING PRIMROSE OIL PO Take 1 capsule by mouth daily with breakfast.   Fish Oil 1200 MG Caps Take 1,200 mg by mouth daily with breakfast.   furosemide 20 MG tablet Commonly known as: LASIX Take 20 mg by mouth daily as needed (feet swelling).   Hair/Skin/Nails/Biotin Tabs Take 1 tablet by mouth daily with breakfast.   hydrochlorothiazide 25 MG tablet Commonly known as: HYDRODIURIL Take 25 mg by mouth  daily with breakfast.   ibuprofen 600 MG tablet Commonly known as: ADVIL Take 600 mg by mouth at bedtime as needed (pain).   latanoprost 0.005 % ophthalmic solution Commonly known as: XALATAN Place 1 drop into the right eye at bedtime.   losartan 100 MG tablet Commonly known as: COZAAR Take 100 mg by mouth daily with breakfast.   Magnesium 300 MG Caps Take 300 mg by mouth daily with breakfast.   metFORMIN 500 MG tablet Commonly known as: GLUCOPHAGE Take 500 mg by mouth daily with breakfast.   multivitamin with minerals Tabs tablet Take 1 tablet by mouth daily with breakfast.   Neuriva Caps Take 1 capsule by mouth daily with breakfast.   Potassium Chloride ER 20 MEQ Tbcr Take 20 mEq by mouth daily as needed (with each dose of Lasix).   propranolol ER 120 MG 24 hr capsule Commonly known as: INDERAL LA Take 120 mg by mouth at bedtime.   SODIUM FLUORIDE (DENTAL RINSE) 0.2 % Soln Place 1 application onto teeth every 14 (fourteen) days.   timolol 0.5 % ophthalmic solution Commonly known as: TIMOPTIC Place 1 drop into the right eye every morning.   traZODone 150 MG tablet Commonly known as: DESYREL Take 75 mg by mouth at bedtime.        Follow-up Information     Rocco Serene, MD Follow up in 1 week(s).   Specialty: Internal Medicine Contact information: [place] [address] [phone]         Nahser, Wonda Cheng, MD .   Specialty: Cardiology Contact information: [address] [place] [address] [phone]                Allergies  Allergen Reactions   Bee Venom Anaphylaxis   Beeswax Anaphylaxis    bees   Fish Allergy Anaphylaxis   Shellfish Allergy Anaphylaxis    And all seafood   Aspirin Other (See Comments)    G6P Deficiency   Eggs Or Egg-Derived Products Hives   Nsaids Other (See Comments)    G6P Deficiency   Sulfa Antibiotics Other (See Comments)    G6P Deficiency      Consultations: Cardiology   Procedures/Studies: DG Chest 2 View  Result Date: 03/19/2021 CLINICAL DATA:  Chest pain for 3 days. EXAM: CHEST - 2 VIEW COMPARISON:  None. FINDINGS: Heart size and mediastinal contours are within normal limits. Lungs are clear. No pleural effusion or pneumothorax is seen. Mild degenerative change within the thoracic spine. No acute-appearing osseous abnormality. IMPRESSION: No active cardiopulmonary disease. No evidence of pneumonia or pulmonary edema. Electronically Signed   By: Franki Cabot M.D.   On: 03/19/2021 11:54   NM Myocar Multi W/Spect W/Wall Motion / EF  Result Date: 03/20/2021  There was no ST segment deviation noted during stress.  No T wave inversion was noted during stress.  The study is normal.  This is a low risk study.  Nuclear stress EF: 57%.  Low risk stress nuclear  study with normal perfusion and normal left ventricular regional and global systolic function.  ECHOCARDIOGRAM COMPLETE  Result Date: 03/20/2021    ECHOCARDIOGRAM REPORT   Patient Name:   KYASIA STEUCK Date of Exam: 03/20/2021 Medical Rec #:  [id number]       Height:       68.0 in Accession #:    [id number]      Weight:       275.0 lb Date of Birth:  1951-02-17      BSA:          2.340 m Patient Age:    70 years        BP:           144/97 mmHg Patient Gender: F               HR:           51 bpm. Exam Location:  Inpatient Procedure: 2D Echo, Cardiac Doppler and Color Doppler Indications:    Chest pain  History:        Patient has no prior history of Echocardiogram examinations.                 Risk Factors:Diabetes, Hypertension and Dyslipidemia.  Sonographer:    [place] Referring Phys: [id number] [place]  1. Left ventricular ejection fraction, by estimation, is 60 to 65%. The left ventricle has normal function. The left ventricle has no regional wall motion abnormalities. Left ventricular diastolic parameters are consistent with Grade I diastolic  dysfunction (impaired relaxation).  2. Right ventricular systolic function is normal. The right ventricular size is normal. There is normal pulmonary artery systolic pressure.  3. The mitral valve is normal in structure. Mild mitral valve regurgitation. No evidence of mitral stenosis.  4. The aortic valve is normal in structure. Aortic valve regurgitation is trivial. No aortic stenosis is present.  5. The inferior vena cava is normal in size with greater than 50% respiratory variability, suggesting right atrial pressure of 3 mmHg. FINDINGS  Left Ventricle: Left ventricular ejection fraction, by estimation, is 60 to 65%. The left ventricle has normal function. The left ventricle has no regional wall motion abnormalities. The left ventricular internal cavity size was normal in size. There is  no left ventricular hypertrophy. Left ventricular diastolic parameters are consistent with Grade I diastolic dysfunction (impaired relaxation). Indeterminate filling pressures. Right Ventricle: The right ventricular size is normal. No increase in right ventricular wall thickness. Right ventricular systolic function is normal. There is normal pulmonary artery systolic pressure. The tricuspid regurgitant velocity is 2.46 m/s, and  with an assumed right atrial pressure of 3 mmHg, the estimated right ventricular systolic pressure is 94.7 mmHg. Left Atrium: Left atrial size was normal in size. Right Atrium: Right atrial size was normal in size. Pericardium: There is no evidence of pericardial effusion. Mitral Valve: The mitral valve is normal in structure. Mild mitral valve regurgitation, with centrally-directed jet. No evidence of mitral valve stenosis. Tricuspid Valve: The tricuspid valve is normal in structure. Tricuspid valve regurgitation is not demonstrated. No evidence of tricuspid stenosis. Aortic Valve: The aortic valve is normal in structure. Aortic valve regurgitation is trivial. Aortic regurgitation PHT measures 659 msec.  No aortic stenosis is present. Aortic valve mean gradient measures 5.0 mmHg. Aortic valve peak gradient measures 9.5  mmHg. Aortic valve area, by VTI measures 2.10 cm. Pulmonic Valve: The pulmonic valve was normal in structure. Pulmonic valve regurgitation is not visualized. No evidence  of pulmonic stenosis. Aorta: The aortic root is normal in size and structure. Venous: The inferior vena cava is normal in size with greater than 50% respiratory variability, suggesting right atrial pressure of 3 mmHg. IAS/Shunts: No atrial level shunt detected by color flow Doppler.  LEFT VENTRICLE PLAX 2D LVIDd:         4.40 cm     Diastology LVIDs:         2.70 cm     LV e' medial:    3.01 cm/s LV PW:         0.90 cm     LV E/e' medial:  13.5 LV IVS:        1.20 cm     LV e' lateral:   6.20 cm/s LVOT diam:     2.20 cm     LV E/e' lateral: 6.5 LV SV:         79 LV SV Index:   34 LVOT Area:     3.80 cm  LV Volumes (MOD) LV vol d, MOD A2C: 68.2 ml LV vol d, MOD A4C: 94.3 ml LV vol s, MOD A2C: 21.7 ml LV vol s, MOD A4C: 29.0 ml LV SV MOD A2C:     46.5 ml LV SV MOD A4C:     94.3 ml LV SV MOD BP:      55.1 ml RIGHT VENTRICLE RV Basal diam:  3.20 cm RV Mid diam:    2.50 cm LEFT ATRIUM             Index       RIGHT ATRIUM           Index LA diam:        4.00 cm 1.71 cm/m  RA Area:     14.50 cm LA Vol (A2C):   61.9 ml 26.45 ml/m RA Volume:   33.90 ml  14.49 ml/m LA Vol (A4C):   39.3 ml 16.80 ml/m LA Biplane Vol: 51.1 ml 21.84 ml/m  AORTIC VALVE                    PULMONIC VALVE AV Area (Vmax):    2.32 cm     PV Vmax:       1.00 m/s AV Area (Vmean):   2.16 cm     PV Vmean:      66.900 cm/s AV Area (VTI):     2.10 cm     PV VTI:        0.224 m AV Vmax:           154.00 cm/s  PV Peak grad:  4.0 mmHg AV Vmean:          101.000 cm/s PV Mean grad:  2.0 mmHg AV VTI:            0.379 m AV Peak Grad:      9.5 mmHg AV Mean Grad:      5.0 mmHg LVOT Vmax:         94.10 cm/s LVOT Vmean:        57.400 cm/s LVOT VTI:          0.209 m LVOT/AV  VTI ratio: 0.55 AI PHT:            659 msec  AORTA Ao Root diam: 3.10 cm Ao Asc diam:  3.50 cm MITRAL VALVE                 TRICUSPID VALVE MV Area (PHT): 2.68  cm      TR Peak grad:   24.2 mmHg MV Decel Time: 283 msec      TR Vmax:        246.00 cm/s MR Peak grad:    148.4 mmHg MR Mean grad:    94.0 mmHg   SHUNTS MR Vmax:         609.00 cm/s Systemic VTI:  0.21 m MR Vmean:        461.0 cm/s  Systemic Diam: 2.20 cm MR PISA:         1.01 cm MR PISA Eff ROA: 4 mm MR PISA Radius:  0.40 cm MV E velocity: 40.50 cm/s MV A velocity: 74.90 cm/s MV E/A ratio:  0.54 Mihai Croitoru MD Electronically signed by Sanda Klein MD Signature Date/Time: 03/20/2021/11:24:34 AM    Final    (Echo, Carotid, EGD, Colonoscopy, ERCP)    Subjective: Patient seen and examined in the morning rounds.  She had some headache after staying in the emergency room all night. Patient seen and examined in the evening for discharge readiness.  Husband at the bedside.  Patient denies any complaints.  She was happy to know about normal cardiac investigation results.  Eager to go home.  Denies any similar episode of chest pain since last night.  She may have some mild left precordial pain.  Walking around in the hallway.  Heart rate remains a stable.  Denies any shortness of breath.   Discharge Exam: Vitals:   03/20/21 1100 03/20/21 1500  BP:  (!) 162/90  Pulse: (!) 51 (!) 58  Resp: 16 15  Temp:    SpO2: 98% 98%   Vitals:   03/20/21 0900 03/20/21 1002 03/20/21 1100 03/20/21 1500  BP: (!) 179/102 (!) 176/98  (!) 162/90  Pulse: (!) 47 (!) 50 (!) 51 (!) 58  Resp: 18 18 16 15   Temp: 98.7 F (37.1 C) 98.5 F (36.9 C)    TempSrc: Oral Oral    SpO2: 98% 98% 98% 98%  Weight:  125.5 kg    Height:  5\' 8"  (1.727 m)      General: Pt is alert, awake, not in acute distress Cardiovascular: RRR, S1/S2 +, no rubs, no gallops Respiratory: CTA bilaterally, no wheezing, no rhonchi Abdominal: Soft, NT, ND, bowel sounds + Extremities: no  edema, no cyanosis    The results of significant diagnostics from this hospitalization (including imaging, microbiology, ancillary and laboratory) are listed below for reference.     Microbiology: Recent Results (from the past 240 hour(s))  Resp Panel by RT-PCR (Flu A&B, Covid) Nasopharyngeal Swab     Status: None   Collection Time: 03/19/21  1:41 PM   Specimen: Nasopharyngeal Swab; Nasopharyngeal(NP) swabs in vial transport medium  Result Value Ref Range Status   SARS Coronavirus 2 by RT PCR NEGATIVE NEGATIVE Final    Comment: (NOTE) SARS-CoV-2 target nucleic acids are NOT DETECTED.  The SARS-CoV-2 RNA is generally detectable in upper respiratory specimens during the acute phase of infection. The lowest concentration of SARS-CoV-2 viral copies this assay can detect is 138 copies/mL. A negative result does not preclude SARS-Cov-2 infection and should not be used as the sole basis for treatment or other patient management decisions. A negative result may occur with  improper specimen collection/handling, submission of specimen other than nasopharyngeal swab, presence of viral mutation(s) within the areas targeted by this assay, and inadequate number of viral copies(<138 copies/mL). A negative result must be combined with clinical observations, patient history,  and epidemiological information. The expected result is Negative.  Fact Sheet for Patients:  EntrepreneurPulse.com.au  Fact Sheet for Healthcare Providers:  IncredibleEmployment.be  This test is no t yet approved or cleared by the [place] FDA and  has been authorized for detection and/or diagnosis of SARS-CoV-2 by FDA under an Emergency Use Authorization (EUA). This EUA will remain  in effect (meaning this test can be used) for the duration of the COVID-19 declaration under Section 564(b)(1) of the Act, 21 U.S.C.section 360bbb-3(b)(1), unless the authorization is terminated  or  revoked sooner.       Influenza A by PCR NEGATIVE NEGATIVE Final   Influenza B by PCR NEGATIVE NEGATIVE Final    Comment: (NOTE) The Xpert Xpress SARS-CoV-2/FLU/RSV plus assay is intended as an aid in the diagnosis of influenza from Nasopharyngeal swab specimens and should not be used as a sole basis for treatment. Nasal washings and aspirates are unacceptable for Xpert Xpress SARS-CoV-2/FLU/RSV testing.  Fact Sheet for Patients: EntrepreneurPulse.com.au  Fact Sheet for Healthcare Providers: IncredibleEmployment.be  This test is not yet approved or cleared by the [place] FDA and has been authorized for detection and/or diagnosis of SARS-CoV-2 by FDA under an Emergency Use Authorization (EUA). This EUA will remain in effect (meaning this test can be used) for the duration of the COVID-19 declaration under Section 564(b)(1) of the Act, 21 U.S.C. section 360bbb-3(b)(1), unless the authorization is terminated or revoked.  Performed at [facility], [place] [address] [place], [place] [address]   MRSA Next Gen by PCR, Nasal     Status: None   Collection Time: 03/20/21 10:07 AM   Specimen: Nasal Mucosa; Nasal Swab  Result Value Ref Range Status   MRSA by PCR Next Gen NOT DETECTED NOT DETECTED Final    Comment: (NOTE) The GeneXpert MRSA Assay (FDA approved for NASAL specimens only), is one component of a comprehensive MRSA colonization surveillance program. It is not intended to diagnose MRSA infection nor to guide or monitor treatment for MRSA infections. Test performance is not FDA approved in patients less than 84 years old. Performed at [facility], [place] [address], [place], [place] [address]      Labs: BNP (last 3 results) No results for input(s): BNP in the last 8760 hours. Basic Metabolic Panel: Recent Labs  Lab 03/19/21 1109  NA 136  K 3.7  CL 101  CO2 25  GLUCOSE 214*  BUN 17   CREATININE 0.89  CALCIUM 9.5   Liver Function Tests: No results for input(s): AST, ALT, ALKPHOS, BILITOT, PROT, ALBUMIN in the last 168 hours. No results for input(s): LIPASE, AMYLASE in the last 168 hours. No results for input(s): AMMONIA in the last 168 hours. CBC: Recent Labs  Lab 03/19/21 1109 03/20/21 0630  WBC 7.3 9.1  HGB 14.0 12.9  HCT 41.6 39.3  MCV 96.7 98.3  PLT 219 179   Cardiac Enzymes: No results for input(s): CKTOTAL, CKMB, CKMBINDEX, TROPONINI in the last 168 hours. BNP: Invalid input(s): POCBNP CBG: Recent Labs  Lab 03/19/21 2348 03/20/21 0757  GLUCAP 145* 146*   D-Dimer No results for input(s): DDIMER in the last 72 hours. Hgb A1c Recent Labs    03/20/21 0020  HGBA1C 5.9*   Lipid Profile Recent Labs    03/20/21 0020  CHOL 99  HDL 36*  LDLCALC 17  TRIG 228*  CHOLHDL 2.8   Thyroid function studies No results for input(s): TSH, T4TOTAL, T3FREE, THYROIDAB in the last 72  hours.  Invalid input(s): FREET3 Anemia work up No results for input(s): VITAMINB12, FOLATE, FERRITIN, TIBC, IRON, RETICCTPCT in the last 72 hours. Urinalysis No results found for: COLORURINE, APPEARANCEUR, [place], [place], GLUCOSEU, [place], [place], [place], PROTEINUR, UROBILINOGEN, NITRITE, LEUKOCYTESUR Sepsis Labs Invalid input(s): PROCALCITONIN,  WBC,  LACTICIDVEN Microbiology Recent Results (from the past 240 hour(s))  Resp Panel by RT-PCR (Flu A&B, Covid) Nasopharyngeal Swab     Status: None   Collection Time: 03/19/21  1:41 PM   Specimen: Nasopharyngeal Swab; Nasopharyngeal(NP) swabs in vial transport medium  Result Value Ref Range Status   SARS Coronavirus 2 by RT PCR NEGATIVE NEGATIVE Final    Comment: (NOTE) SARS-CoV-2 target nucleic acids are NOT DETECTED.  The SARS-CoV-2 RNA is generally detectable in upper respiratory specimens during the acute phase of infection. The lowest concentration of SARS-CoV-2 viral copies this assay can detect is 138  copies/mL. A negative result does not preclude SARS-Cov-2 infection and should not be used as the sole basis for treatment or other patient management decisions. A negative result may occur with  improper specimen collection/handling, submission of specimen other than nasopharyngeal swab, presence of viral mutation(s) within the areas targeted by this assay, and inadequate number of viral copies(<138 copies/mL). A negative result must be combined with clinical observations, patient history, and epidemiological information. The expected result is Negative.  Fact Sheet for Patients:  EntrepreneurPulse.com.au  Fact Sheet for Healthcare Providers:  IncredibleEmployment.be  This test is no t yet approved or cleared by the [place] FDA and  has been authorized for detection and/or diagnosis of SARS-CoV-2 by FDA under an Emergency Use Authorization (EUA). This EUA will remain  in effect (meaning this test can be used) for the duration of the COVID-19 declaration under Section 564(b)(1) of the Act, 21 U.S.C.section 360bbb-3(b)(1), unless the authorization is terminated  or revoked sooner.       Influenza A by PCR NEGATIVE NEGATIVE Final   Influenza B by PCR NEGATIVE NEGATIVE Final    Comment: (NOTE) The Xpert Xpress SARS-CoV-2/FLU/RSV plus assay is intended as an aid in the diagnosis of influenza from Nasopharyngeal swab specimens and should not be used as a sole basis for treatment. Nasal washings and aspirates are unacceptable for Xpert Xpress SARS-CoV-2/FLU/RSV testing.  Fact Sheet for Patients: EntrepreneurPulse.com.au  Fact Sheet for Healthcare Providers: IncredibleEmployment.be  This test is not yet approved or cleared by the [place] FDA and has been authorized for detection and/or diagnosis of SARS-CoV-2 by FDA under an Emergency Use Authorization (EUA). This EUA will remain in effect (meaning  this test can be used) for the duration of the COVID-19 declaration under Section 564(b)(1) of the Act, 21 U.S.C. section 360bbb-3(b)(1), unless the authorization is terminated or revoked.  Performed at [facility], [place] [address] [place], [place] [address]   MRSA Next Gen by PCR, Nasal     Status: None   Collection Time: 03/20/21 10:07 AM   Specimen: Nasal Mucosa; Nasal Swab  Result Value Ref Range Status   MRSA by PCR Next Gen NOT DETECTED NOT DETECTED Final    Comment: (NOTE) The GeneXpert MRSA Assay (FDA approved for NASAL specimens only), is one component of a comprehensive MRSA colonization surveillance program. It is not intended to diagnose MRSA infection nor to guide or monitor treatment for MRSA infections. Test performance is not FDA approved in patients less than 41 years old. Performed at [facility], [place] [address] [place],  [address]  Time coordinating discharge:  32 minutes  SIGNED:   Barb Merino, MD  [facility] 03/20/2021, 3:30 PM

## 2021-03-20 NOTE — Plan of Care (Signed)
  Problem: Education: Goal: Knowledge of General Education information will improve Description: Including pain rating scale, medication(s)/side effects and non-pharmacologic comfort measures 03/20/2021 1536 by Justice Britain, RN Outcome: Adequate for Discharge 03/20/2021 1049 by Justice Britain, RN Outcome: Progressing   Problem: Health Behavior/Discharge Planning: Goal: Ability to manage health-related needs will improve Outcome: Adequate for Discharge   Problem: Clinical Measurements: Goal: Ability to maintain clinical measurements within normal limits will improve Outcome: Adequate for Discharge Goal: Will remain free from infection Outcome: Adequate for Discharge Goal: Diagnostic test results will improve Outcome: Adequate for Discharge Goal: Respiratory complications will improve Outcome: Adequate for Discharge Goal: Cardiovascular complication will be avoided Outcome: Adequate for Discharge   Problem: Activity: Goal: Risk for activity intolerance will decrease Outcome: Adequate for Discharge   Problem: Nutrition: Goal: Adequate nutrition will be maintained Outcome: Adequate for Discharge   Problem: Coping: Goal: Level of anxiety will decrease Outcome: Adequate for Discharge   Problem: Elimination: Goal: Will not experience complications related to bowel motility Outcome: Adequate for Discharge Goal: Will not experience complications related to urinary retention Outcome: Adequate for Discharge   Problem: Pain Managment: Goal: General experience of comfort will improve Outcome: Adequate for Discharge   Problem: Safety: Goal: Ability to remain free from injury will improve Outcome: Adequate for Discharge   Problem: Skin Integrity: Goal: Risk for impaired skin integrity will decrease Outcome: Adequate for Discharge

## 2021-03-20 NOTE — Progress Notes (Signed)
ANTICOAGULATION CONSULT NOTE  Pharmacy Consult for Heparin Indication: ACS  Allergies  Allergen Reactions   Bee Venom Anaphylaxis   Beeswax Anaphylaxis    bees   Fish Allergy Anaphylaxis   Shellfish Allergy Anaphylaxis    And all seafood   Aspirin Other (See Comments)    G6P Deficiency   Eggs Or Egg-Derived Products Hives   Nsaids Other (See Comments)    G6P Deficiency   Sulfa Antibiotics Other (See Comments)    G6P Deficiency     Patient Measurements: Height: 5\' 8"  (172.7 cm) Weight: 124.7 kg (275 lb) IBW/kg (Calculated) : 63.9 Heparin Dosing Weight: 93 kg  Vital Signs: BP: 132/87 (07/10 0200) Pulse Rate: 52 (07/10 0200)  Labs: Recent Labs    03/19/21 1109 03/19/21 1341 03/19/21 2000 03/19/21 2230 03/20/21 0020 03/20/21 0345  HGB 14.0  --   --   --   --   --   HCT 41.6  --   --   --   --   --   PLT 219  --   --   --   --   --   APTT 28  --   --   --   --   --   LABPROT 13.2  --   --   --   --   --   INR 1.0  --   --   --   --   --   HEPARINUNFRC  --   --  >1.10* <0.10*  --  0.45  CREATININE 0.89  --   --   --   --   --   TROPONINIHS 4 3  --   --  4  --      Estimated Creatinine Clearance: 81.9 mL/min (by C-G formula based on SCr of 0.89 mg/dL).   Medical History: Past Medical History:  Diagnosis Date   DM (diabetes mellitus) ([place])    Essential tremor    head   G6PD deficiency    HTN (hypertension)    Hyperlipidemia     Medications:  Scheduled:  Infusions:   Assessment: 15 yoF presented to ED on 7/9 with chest pain, first started 3 days ago.  Pharmacy is consulted to dose Heparin for ACS.  No prior to admission anticoagulants. Baseline coags WNL; CBC: Hgb and Plt WNL  03/20/2021: Heparin level 0.45- therapeutic on IV heparin 1400 units/hr.  Level drawn early- after only 3hrs on new rate. CBC WNL No bleeding or infusion related concerns per RN  Goal of Therapy:  Heparin level 0.3-0.7 units/ml Monitor platelets by anticoagulation  protocol: Yes   Plan:  Continue IV heparin infusion at 1400 units/hr Check confirmatory heparin level at noon Daily heparin level & CBC while on heparin  Netta Cedars PharmD 03/20/2021, 5:03 AM

## 2021-03-20 NOTE — Progress Notes (Signed)
Patient here from [facility] where she was monitored. Placed on cardiac monitor here and is in sinus brady with no ectopics seen. L Ingold called to see if patient could be off cardiac monitor until [facility] picks her up for return to [facility]. Serita Butcher spoke with Dr Acie Fredrickson to check on this and then called me back and gave me a verbal order that patient can be off cardiac monitor until return to [facility],

## 2021-03-20 NOTE — Progress Notes (Signed)
PROGRESS NOTE    Meridith Romick  SVX:[id number] DOB: 1951-01-26 DOA: 03/19/2021 PCP: Rocco Serene, MD    Brief Narrative:  70 year old active female with history of type 2 diabetes on oral hypoglycemics, hypertension and hyperlipidemia presented with 3 days of intermittent chest pain not relieved by pain medications at home.  In the emergency room serial troponins negative.  EKG showed sinus bradycardia with some ST inversions.  Chest x-ray normal.  Due to nature of chest pain, patient was started on heparin infusion and admitted to the hospital with cardiology consultation.   Assessment & Plan:   Active Problems:   Chest pain  Chest pain: Acute coronary syndrome ruled out.  Today chest pain-free.  Presented with 3 days of intermittent resting chest pain.  Heart score of 7. Continue heparin infusion.  Symptomatic treatment.  Not taking aspirin because of G6PD deficiency.  Will start Plavix if positive for coronary artery disease. Seen by cardiology. Severe shellfish allergy, unable to give contrast. Patient plan for nuclear medicine scan.  We will continue to monitor in the hospital. Will use nitrates if recurrent chest pain.  Hypertension: Blood pressures elevated today.  Resume home medications including diuretics.  Type 2 diabetes: Well-controlled on metformin at home.  Hold metformin for anticipated contrast studies.  Keep on sliding scale insulin.  DVT prophylaxis:   Heparin subcu   Code Status: Full code Family Communication: Husband at the bedside Disposition Plan: Status is: Inpatient  Remains inpatient appropriate because:IV treatments appropriate due to intensity of illness or inability to take PO and Inpatient level of care appropriate due to severity of illness  Dispo: The patient is from: Home              Anticipated d/c is to: Home              Patient currently is not medically stable to d/c.   Difficult to place patient No         Consultants:   Cardiology  Procedures:  None  Antimicrobials:  None   Subjective: Patient seen and examined.  She was still in the emergency room when I examined her.  Chest pain has improved but she has headache.  Could not sleep all night.  Husband was at the bedside.  Had multiple questions and that were answered.  Undergoing echocardiogram.  Objective: Vitals:   03/20/21 0653 03/20/21 0800 03/20/21 0900 03/20/21 1002  BP: (!) 144/97 (!) 181/93 (!) 179/102 (!) 176/98  Pulse: (!) 54 (!) 49 (!) 47 (!) 50  Resp: (!) 23 20 18 18   Temp:   98.7 F (37.1 C) 98.5 F (36.9 C)  TempSrc:   Oral Oral  SpO2: 96% 98% 98% 98%  Weight:    125.5 kg  Height:    5\' 8"  (1.727 m)   No intake or output data in the 24 hours ending 03/20/21 1057 Filed Weights   03/19/21 1058 03/20/21 1002  Weight: 124.7 kg 125.5 kg    Examination:  General exam: Appears calm and comfortable  Respiratory system: Clear to auscultation. Respiratory effort normal. Cardiovascular system: S1 & S2 heard, RRR. No JVD, murmurs, rubs, gallops or clicks. No pedal edema. Gastrointestinal system: Abdomen is nondistended, soft and nontender. No organomegaly or masses felt. Normal bowel sounds heard. Central nervous system: Alert and oriented. No focal neurological deficits. Extremities: Symmetric 5 x 5 power. Skin: No rashes, lesions or ulcers Psychiatry: Judgement and insight appear normal. Mood & affect appropriate.  Data Reviewed: I have personally reviewed following labs and imaging studies  CBC: Recent Labs  Lab 03/19/21 1109 03/20/21 0630  WBC 7.3 9.1  HGB 14.0 12.9  HCT 41.6 39.3  MCV 96.7 98.3  PLT 219 161   Basic Metabolic Panel: Recent Labs  Lab 03/19/21 1109  NA 136  K 3.7  CL 101  CO2 25  GLUCOSE 214*  BUN 17  CREATININE 0.89  CALCIUM 9.5   GFR: Estimated Creatinine Clearance: 82.2 mL/min (by C-G formula based on SCr of 0.89 mg/dL). Liver Function Tests: No results for input(s): AST, ALT,  ALKPHOS, BILITOT, PROT, ALBUMIN in the last 168 hours. No results for input(s): LIPASE, AMYLASE in the last 168 hours. No results for input(s): AMMONIA in the last 168 hours. Coagulation Profile: Recent Labs  Lab 03/19/21 1109  INR 1.0   Cardiac Enzymes: No results for input(s): CKTOTAL, CKMB, CKMBINDEX, TROPONINI in the last 168 hours. BNP (last 3 results) No results for input(s): PROBNP in the last 8760 hours. HbA1C: Recent Labs    03/20/21 0020  HGBA1C 5.9*   CBG: Recent Labs  Lab 03/19/21 2348 03/20/21 0757  GLUCAP 145* 146*   Lipid Profile: Recent Labs    03/20/21 0020  CHOL 99  HDL 36*  LDLCALC 17  TRIG 228*  CHOLHDL 2.8   Thyroid Function Tests: No results for input(s): TSH, T4TOTAL, FREET4, T3FREE, THYROIDAB in the last 72 hours. Anemia Panel: No results for input(s): VITAMINB12, FOLATE, FERRITIN, TIBC, IRON, RETICCTPCT in the last 72 hours. Sepsis Labs: No results for input(s): PROCALCITON, LATICACIDVEN in the last 168 hours.  Recent Results (from the past 240 hour(s))  Resp Panel by RT-PCR (Flu A&B, Covid) Nasopharyngeal Swab     Status: None   Collection Time: 03/19/21  1:41 PM   Specimen: Nasopharyngeal Swab; Nasopharyngeal(NP) swabs in vial transport medium  Result Value Ref Range Status   SARS Coronavirus 2 by RT PCR NEGATIVE NEGATIVE Final    Comment: (NOTE) SARS-CoV-2 target nucleic acids are NOT DETECTED.  The SARS-CoV-2 RNA is generally detectable in upper respiratory specimens during the acute phase of infection. The lowest concentration of SARS-CoV-2 viral copies this assay can detect is 138 copies/mL. A negative result does not preclude SARS-Cov-2 infection and should not be used as the sole basis for treatment or other patient management decisions. A negative result may occur with  improper specimen collection/handling, submission of specimen other than nasopharyngeal swab, presence of viral mutation(s) within the areas targeted by  this assay, and inadequate number of viral copies(<138 copies/mL). A negative result must be combined with clinical observations, patient history, and epidemiological information. The expected result is Negative.  Fact Sheet for Patients:  EntrepreneurPulse.com.au  Fact Sheet for Healthcare Providers:  IncredibleEmployment.be  This test is no t yet approved or cleared by the [place] FDA and  has been authorized for detection and/or diagnosis of SARS-CoV-2 by FDA under an Emergency Use Authorization (EUA). This EUA will remain  in effect (meaning this test can be used) for the duration of the COVID-19 declaration under Section 564(b)(1) of the Act, 21 U.S.C.section 360bbb-3(b)(1), unless the authorization is terminated  or revoked sooner.       Influenza A by PCR NEGATIVE NEGATIVE Final   Influenza B by PCR NEGATIVE NEGATIVE Final    Comment: (NOTE) The Xpert Xpress SARS-CoV-2/FLU/RSV plus assay is intended as an aid in the diagnosis of influenza from Nasopharyngeal swab specimens and should not be used as a sole  basis for treatment. Nasal washings and aspirates are unacceptable for Xpert Xpress SARS-CoV-2/FLU/RSV testing.  Fact Sheet for Patients: EntrepreneurPulse.com.au  Fact Sheet for Healthcare Providers: IncredibleEmployment.be  This test is not yet approved or cleared by the [place] FDA and has been authorized for detection and/or diagnosis of SARS-CoV-2 by FDA under an Emergency Use Authorization (EUA). This EUA will remain in effect (meaning this test can be used) for the duration of the COVID-19 declaration under Section 564(b)(1) of the Act, 21 U.S.C. section 360bbb-3(b)(1), unless the authorization is terminated or revoked.  Performed at [facility], [place] [address], [place], [place] [address]          Radiology Studies: DG Chest 2 View  Result Date:  03/19/2021 CLINICAL DATA:  Chest pain for 3 days. EXAM: CHEST - 2 VIEW COMPARISON:  None. FINDINGS: Heart size and mediastinal contours are within normal limits. Lungs are clear. No pleural effusion or pneumothorax is seen. Mild degenerative change within the thoracic spine. No acute-appearing osseous abnormality. IMPRESSION: No active cardiopulmonary disease. No evidence of pneumonia or pulmonary edema. Electronically Signed   By: Franki Cabot M.D.   On: 03/19/2021 11:54        Scheduled Meds:  atorvastatin  40 mg Oral Q breakfast   busPIRone  7.5 mg Oral Q breakfast   Chlorhexidine Gluconate Cloth  6 each Topical Daily   hydrochlorothiazide  25 mg Oral Q breakfast   insulin aspart  0-15 Units Subcutaneous TID WC   insulin aspart  0-5 Units Subcutaneous QHS   losartan  100 mg Oral Q breakfast   propranolol ER  120 mg Oral QHS   traZODone  75 mg Oral QHS   Continuous Infusions:  heparin 1,400 Units/hr (03/20/21 0937)     LOS: 1 day    Time spent: 35 minutes    Barb Merino, MD [facility] Pager [phone]

## 2021-03-20 NOTE — Plan of Care (Signed)
  Problem: Education: Goal: Knowledge of General Education information will improve Description Including pain rating scale, medication(s)/side effects and non-pharmacologic comfort measures Outcome: Progressing   

## 2021-03-20 NOTE — Progress Notes (Signed)
*  PRELIMINARY RESULTS* Echocardiogram 2D Echocardiogram has been performed.  Luisa Hart RDCS 03/20/2021, 9:08 AM

## 2021-03-20 NOTE — Care Management Obs Status (Signed)
[place] NOTIFICATION   Patient Details  Name: Wendy Obrien MRN: [id number] Date of Birth: 1950/09/13   Medicare Observation Status Notification Given:  Macario Golds, LCSW 03/20/2021, 4:13 PM

## 2021-03-20 NOTE — Progress Notes (Signed)
Nuc study stress portion completed without complications.  She did have elevated BP- hx of white coat syndrome - BP improving at end of test.  Results pending.

## 2021-03-20 NOTE — Progress Notes (Signed)
ANTICOAGULATION CONSULT NOTE  Pharmacy Consult for Heparin Indication: ACS  Allergies  Allergen Reactions   Bee Venom Anaphylaxis   Beeswax Anaphylaxis    bees   Fish Allergy Anaphylaxis   Shellfish Allergy Anaphylaxis    And all seafood   Aspirin Other (See Comments)    G6P Deficiency   Eggs Or Egg-Derived Products Hives   Nsaids Other (See Comments)    G6P Deficiency   Sulfa Antibiotics Other (See Comments)    G6P Deficiency     Patient Measurements: Height: 5\' 8"  (172.7 cm) Weight: 125.5 kg (276 lb 10.8 oz) IBW/kg (Calculated) : 63.9 Heparin Dosing Weight: 93 kg  Vital Signs: Temp: 98.5 F (36.9 C) (07/10 1002) Temp Source: Oral (07/10 1002) BP: 201/111 (07/10 1324) Pulse Rate: 51 (07/10 1100)  Labs: Recent Labs    03/19/21 1109 03/19/21 1341 03/19/21 2000 03/19/21 2230 03/20/21 0020 03/20/21 0345 03/20/21 0630 03/20/21 0911  HGB 14.0  --   --   --   --   --  12.9  --   HCT 41.6  --   --   --   --   --  39.3  --   PLT 219  --   --   --   --   --  179  --   APTT 28  --   --   --   --   --   --   --   LABPROT 13.2  --   --   --   --   --   --   --   INR 1.0  --   --   --   --   --   --   --   HEPARINUNFRC  --   --    < > <0.10*  --  0.45  --  0.71*  CREATININE 0.89  --   --   --   --   --   --   --   TROPONINIHS 4 3  --   --  4  --   --   --    < > = values in this interval not displayed.     Estimated Creatinine Clearance: 82.2 mL/min (by C-G formula based on SCr of 0.89 mg/dL).   Medical History: Past Medical History:  Diagnosis Date   DM (diabetes mellitus) ([place])    Essential tremor    head   G6PD deficiency    HTN (hypertension)    Hyperlipidemia     Medications:  Scheduled:  Infusions:   Assessment: 7 yoF presented to ED on 7/9 with chest pain, first started 3 days ago.  Pharmacy is consulted to dose Heparin for ACS.  No prior to admission anticoagulants. Baseline coags WNL; CBC: Hgb and Plt WNL  03/20/2021: Repeat heparin level  borderline supratherapeutic but was again drawn 3 hr early (d/t patient being transported to Nuclear Med CBC WNL No bleeding or infusion related concerns per RN Unable to contact NM department to confirm heparin still running or make any adjustments  Goal of Therapy:  Heparin level 0.3-0.7 units/ml Monitor platelets by anticoagulation protocol: Yes  Plan:  Continue IV heparin infusion at 1400 units/hr Repeat heparin level once patient returns to [facility] (if possible confirm no interruptions during NM imaging) Daily heparin level & CBC while on heparin  Reuel Boom, PharmD, BCPS [phone] 03/20/2021, 1:30 PM

## 2021-03-22 ENCOUNTER — Telehealth: Payer: Self-pay | Admitting: Cardiovascular Disease

## 2021-03-22 NOTE — Telephone Encounter (Signed)
Pt is calling stating her daughter works in the cath lab with the doctor and the daughter told her to call to see if that could help her with her appt

## 2021-03-22 NOTE — Telephone Encounter (Signed)
Spoke to the patient about the wait list she was on. Double checked Dr. Elmarie Shiley schedule for openings, none at this time. Noted patient seeing PCP 7/25. Patient thankful for the call back.

## 2021-03-28 ENCOUNTER — Other Ambulatory Visit: Payer: Self-pay | Admitting: Family Medicine

## 2021-03-28 DIAGNOSIS — M25532 Pain in left wrist: Secondary | ICD-10-CM

## 2021-04-01 ENCOUNTER — Ambulatory Visit
Admission: RE | Admit: 2021-04-01 | Discharge: 2021-04-01 | Disposition: A | Discharge status: Home / Self Care | Payer: Medicare Other | Source: Ambulatory Visit | Attending: Family Medicine | Admitting: Family Medicine

## 2021-04-01 ENCOUNTER — Other Ambulatory Visit: Payer: Self-pay | Admitting: Family Medicine

## 2021-04-01 ENCOUNTER — Other Ambulatory Visit: Payer: Self-pay

## 2021-04-01 DIAGNOSIS — M25532 Pain in left wrist: Secondary | ICD-10-CM

## 2021-04-05 ENCOUNTER — Other Ambulatory Visit: Payer: Self-pay

## 2021-04-05 ENCOUNTER — Ambulatory Visit
Admission: RE | Admit: 2021-04-05 | Discharge: 2021-04-05 | Disposition: A | Discharge status: Home / Self Care | Payer: Medicare Other | Source: Ambulatory Visit | Attending: Family Medicine | Admitting: Family Medicine

## 2021-04-05 DIAGNOSIS — M25532 Pain in left wrist: Secondary | ICD-10-CM

## 2021-04-27 DIAGNOSIS — M25532 Pain in left wrist: Secondary | ICD-10-CM | POA: Insufficient documentation

## 2021-04-27 DIAGNOSIS — M654 Radial styloid tenosynovitis [de Quervain]: Secondary | ICD-10-CM | POA: Insufficient documentation

## 2021-06-12 ENCOUNTER — Encounter: Payer: Self-pay | Admitting: Cardiovascular Disease

## 2021-06-12 NOTE — Progress Notes (Signed)
Cardiology Office Note:    Date:  06/13/2021   ID:  Wendy Obrien, DOB 31-May-1951, MRN [id number]  PCP:  Roselee Nova, MD   [place] Providers Cardiologist:  Mertie Moores, MD     Referring MD: Rocco Serene, MD   Chief Complaint  Patient presents with   Chest Pain   Hypertension    Oct. 3, 2022   Wendy Obrien is a 70 y.o. female with a hx of HTN. She was admitted to the hospital with chest pain in July. Leane Call from March 20, 2021 showed no ischemia and was normal .  She seems to be ok now Is very active - tend to her grandchildren Gets fatigued by 3 PM in the afternoon .   HR is slow - is on inderal LA for essential tremor   Past Medical History:  Diagnosis Date   DM (diabetes mellitus) ([place])    Essential tremor    head   G6PD deficiency    HTN (hypertension)    Hyperlipidemia     Past Surgical History:  Procedure Laterality Date   ABDOMINAL HYSTERECTOMY     ANKLE SURGERY Right    x 2   BREAST LUMPECTOMY Left    benign   EYE SURGERY Left    age 56 - L eye removed   REDUCTION MAMMAPLASTY Bilateral    1998    Current Medications: Current Meds  Medication Sig   acetaminophen (TYLENOL) 500 MG tablet Take 500-1,000 mg by mouth daily as needed for headache (pain).   atorvastatin (LIPITOR) 40 MG tablet Take 40 mg by mouth daily with breakfast.   BETA CAROTENE PO Take 1 tablet by mouth daily with breakfast.   busPIRone (BUSPAR) 7.5 MG tablet Take 7.5 mg by mouth daily with breakfast.   EPINEPHrine 0.3 mg/0.3 mL IJ SOAJ injection Inject into the muscle as directed.   EVENING PRIMROSE OIL PO Take 1 capsule by mouth daily with breakfast.   furosemide (LASIX) 20 MG tablet Take 20 mg by mouth daily as needed (feet swelling).   hydrochlorothiazide (HYDRODIURIL) 25 MG tablet Take 25 mg by mouth daily with breakfast.   latanoprost (XALATAN) 0.005 % ophthalmic solution Place 1 drop into the right eye at bedtime.   losartan (COZAAR) 100 MG  tablet Take 100 mg by mouth daily with breakfast.   Magnesium 300 MG CAPS Take 300 mg by mouth daily as needed.   metFORMIN (GLUCOPHAGE) 500 MG tablet Take 500 mg by mouth daily with breakfast.   Misc Natural Products (NEURIVA) CAPS Take 1 capsule by mouth daily with breakfast.   Multiple Vitamin (MULTIVITAMIN WITH MINERALS) TABS tablet Take 1 tablet by mouth daily with breakfast.   Multiple Vitamins-Minerals (HAIR/SKIN/NAILS/BIOTIN) TABS Take 1 tablet by mouth daily with breakfast.   Omega-3 Fatty Acids (FISH OIL) 1200 MG CAPS Take 1,200 mg by mouth daily with breakfast.   Potassium Chloride ER 20 MEQ TBCR Take 20 mEq by mouth daily as needed (with each dose of Lasix).   propranolol ER (INDERAL LA) 120 MG 24 hr capsule Take 120 mg by mouth at bedtime.   SODIUM FLUORIDE, DENTAL RINSE, 0.2 % SOLN Place 1 application onto teeth every 14 (fourteen) days.   timolol (TIMOPTIC) 0.5 % ophthalmic solution Place 1 drop into the right eye every morning.   traZODone (DESYREL) 50 MG tablet Take 50 mg by mouth at bedtime.     Allergies:   Bee venom, Beeswax, Fish allergy, Shellfish allergy, Aspirin, Eggs  or egg-derived products, Nsaids, and Sulfa antibiotics   Social History   Socioeconomic History   Marital status: Married    Spouse name: Not on file   Number of children: Not on file   Years of education: Not on file   Highest education level: Not on file  Occupational History   Not on file  Tobacco Use   Smoking status: Former    Types: Cigarettes    Quit date: 41    Years since quitting: 24.7   Smokeless tobacco: Never  Vaping Use   Vaping Use: Never used  Substance and Sexual Activity   Alcohol use: Yes    Comment: occasional wine on holiday   Drug use: Not on file   Sexual activity: Yes  Other Topics Concern   Not on file  Social History Narrative   Not on file   Social Determinants of Health   Financial Resource Strain: Not on file  Food Insecurity: Not on file   Transportation Needs: Not on file  Physical Activity: Not on file  Stress: Not on file  Social Connections: Not on file     Family History: The patient's family history includes Colon cancer in her father; HIV in her brother and brother; Lung cancer in her mother.  ROS:   Please see the history of present illness.     All other systems reviewed and are negative.  EKGs/Labs/Other Studies Reviewed:    The following studies were reviewed today:   EKG:   Recent Labs: 03/19/2021: BUN 17; Creatinine, Ser 0.89; Potassium 3.7; Sodium 136 03/20/2021: Hemoglobin 12.9; Platelets 179  Recent Lipid Panel    Component Value Date/Time   CHOL 99 03/20/2021 0020   TRIG 228 (H) 03/20/2021 0020   HDL 36 (L) 03/20/2021 0020   CHOLHDL 2.8 03/20/2021 0020   VLDL 46 (H) 03/20/2021 0020   LDLCALC 17 03/20/2021 0020     Risk Assessment/Calculations:           Physical Exam:    VS:  BP 140/80   Pulse (!) 54   Ht 5\' 8"  (1.727 m)   Wt 275 lb 12.8 oz (125.1 kg)   SpO2 94%   BMI 41.94 kg/m     Wt Readings from Last 3 Encounters:  06/13/21 275 lb 12.8 oz (125.1 kg)  03/20/21 276 lb 10.8 oz (125.5 kg)  07/25/19 276 lb 12.8 oz (125.6 kg)     GEN:   Well nourished, well developed in no acute distress HEENT: Normal NECK: No JVD; No carotid bruits LYMPHATICS: No lymphadenopathy CARDIAC: RRR, no murmurs, rubs, gallops RESPIRATORY:  Clear to auscultation without rales, wheezing or rhonchi  ABDOMEN: Soft, non-tender, non-distended MUSCULOSKELETAL:  No edema; No deformity  SKIN: Warm and dry NEUROLOGIC:  Alert and oriented x 3 PSYCHIATRIC:  Normal affect   ASSESSMENT:    1. Diaphoresis   2. Chronic diastolic heart failure (HCC)    PLAN:    In order of problems listed above:  Chronic diastolic congestive heart failure: Needs to work harder on avoiding salt.  She does take Lasix and potassium and magnesium on an as-needed basis.  Have advised her to work on a weight loss  program.  2.  Hypertension: She will continue to work on avoiding salt.  I will see her in 6 months.        Medication Adjustments/Labs and Tests Ordered: Current medicines are reviewed at length with the patient today.  Concerns regarding medicines are outlined above.  Orders Placed This Encounter  Procedures   TSH    No orders of the defined types were placed in this encounter.    Patient Instructions  Medication Instructions:  Your physician recommends that you continue on your current medications as directed. Please refer to the Current Medication list given to you today.  *If you need a refill on your cardiac medications before your next appointment, please call your pharmacy*   Lab Work: Today: TSH If you have labs (blood work) drawn today and your tests are completely normal, you will receive your results only by: [place] (if you have MyChart) OR A paper copy in the mail If you have any lab test that is abnormal or we need to change your treatment, we will call you to review the results.   Testing/Procedures: NONE   Follow-Up: At [facility], you and your health needs are our priority.  As part of our continuing mission to provide you with exceptional heart care, we have created designated Provider Care Teams.  These Care Teams include your primary Cardiologist (physician) and Advanced Practice Providers (APPs -  Physician Assistants and Nurse Practitioners) who all work together to provide you with the care you need, when you need it.     Your next appointment:   6 month(s)  The format for your next appointment:   In Person  Provider:   You may see Mertie Moores, MD or one of the following Advanced Practice Providers on your designated Care Team:   Richardson Dopp, PA-C Robbie Lis, [place]    Signed, Mertie Moores, MD  06/13/2021 8:42 PM    [place]

## 2021-06-13 ENCOUNTER — Encounter: Payer: Self-pay | Admitting: Cardiovascular Disease

## 2021-06-13 ENCOUNTER — Ambulatory Visit (INDEPENDENT_AMBULATORY_CARE_PROVIDER_SITE_OTHER): Payer: Medicare Other | Admitting: Cardiovascular Disease

## 2021-06-13 ENCOUNTER — Other Ambulatory Visit: Payer: Self-pay

## 2021-06-13 VITALS — BP 140/80 | HR 54 | Ht 68.0 in | Wt 275.8 lb

## 2021-06-13 DIAGNOSIS — R61 Generalized hyperhidrosis: Secondary | ICD-10-CM | POA: Diagnosis not present

## 2021-06-13 DIAGNOSIS — I5032 Chronic diastolic (congestive) heart failure: Secondary | ICD-10-CM | POA: Diagnosis not present

## 2021-06-13 NOTE — Patient Instructions (Signed)
Medication Instructions:  Your physician recommends that you continue on your current medications as directed. Please refer to the Current Medication list given to you today.  *If you need a refill on your cardiac medications before your next appointment, please call your pharmacy*   Lab Work: Today: TSH If you have labs (blood work) drawn today and your tests are completely normal, you will receive your results only by: [place] (if you have MyChart) OR A paper copy in the mail If you have any lab test that is abnormal or we need to change your treatment, we will call you to review the results.   Testing/Procedures: NONE   Follow-Up: At [facility], you and your health needs are our priority.  As part of our continuing mission to provide you with exceptional heart care, we have created designated Provider Care Teams.  These Care Teams include your primary Cardiologist (physician) and Advanced Practice Providers (APPs -  Physician Assistants and Nurse Practitioners) who all work together to provide you with the care you need, when you need it.     Your next appointment:   6 month(s)  The format for your next appointment:   In Person  Provider:   You may see Mertie Moores, MD or one of the following Advanced Practice Providers on your designated Care Team:   Richardson Dopp, PA-C [place], [place]

## 2021-06-14 ENCOUNTER — Ambulatory Visit (INDEPENDENT_AMBULATORY_CARE_PROVIDER_SITE_OTHER): Payer: Medicare Other | Admitting: Internal Medicine

## 2021-06-14 ENCOUNTER — Encounter: Payer: Self-pay | Admitting: Internal Medicine

## 2021-06-14 VITALS — BP 160/93 | HR 59 | Resp 16 | Ht 67.5 in | Wt 275.0 lb

## 2021-06-14 DIAGNOSIS — M25571 Pain in right ankle and joints of right foot: Secondary | ICD-10-CM | POA: Diagnosis not present

## 2021-06-14 DIAGNOSIS — R768 Other specified abnormal immunological findings in serum: Secondary | ICD-10-CM | POA: Diagnosis not present

## 2021-06-14 DIAGNOSIS — M25572 Pain in left ankle and joints of left foot: Secondary | ICD-10-CM

## 2021-06-14 DIAGNOSIS — G8929 Other chronic pain: Secondary | ICD-10-CM | POA: Diagnosis not present

## 2021-06-14 DIAGNOSIS — M25532 Pain in left wrist: Secondary | ICD-10-CM | POA: Insufficient documentation

## 2021-06-14 LAB — TSH: TSH: 2.13 u[IU]/mL (ref 0.450–4.500)

## 2021-06-14 NOTE — Progress Notes (Signed)
Office Visit Note  Patient: Wendy Obrien             Date of Birth: 08-13-1951           MRN: [id number]             PCP: Roselee Nova, MD Referring: Roselee Nova, MD Visit Date: 06/14/2021  Subjective:  New Patient (Initial Visit) (Abnormal labs)   History of Present Illness: Mionna Advincula is a 70 y.o. female with a history of HTN, HLD, essential tremor, and glaucoma here with pain and swelling of the left wrist.  The problem has been noticeable during at least about 9 months duration.  Xray of the left wrist checked with orthopedics clinic was unremarkable.  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  The trial of oral steroids was partially beneficial to symptoms.  About 1 month ago she saw Dr. Tommie Raymond with [facility] with intra-articular steroid injection that has substantially improved her symptoms.  She still has some pain mostly with pressure or use.  No numbness tingling or loss of grip strength in her hand.  She has never experienced similar symptoms in the wrist previously.  She does not recall any specific preceding events or medical changes.  She has been helping take care of her grandchild during this time.  Recently had to discontinue this due to worsening wrist pain limiting her ability to carry them. She notices some right wrist pain to a lesser extent without visible swelling or decreased range of motion.  She is also had some catching or locking with sharp pain at the right third digit PIP joint. She also has some bilateral ankle pain history.  Fracture of the right ankle in 1998 requiring plate fixation.  She broke her left ankle falling about 5 years ago that was managed nonoperatively.  She experiences some lower extremity pitting edema and pain but this is not in current exacerbation. She reports generalized hair thinning that is not new.  She denies any skin rash, oral ulcers,  lymphadenopathy, fevers, finger discoloration, or history of blood clots.  Labs reviewed 04/2021 ANA 1:40 homogenous ESR 6 Uric acid 5.2  Activities of Daily Living:  Patient reports morning stiffness for 10 minutes.   Patient Reports nocturnal pain.  Difficulty dressing/grooming: Denies Difficulty climbing stairs: Denies Difficulty getting out of chair: Reports Difficulty using hands for taps, buttons, cutlery, and/or writing: Denies  Review of Systems  Constitutional:  Positive for fatigue.  HENT:  Positive for mouth dryness.   Eyes:  Positive for dryness.  Respiratory:  Positive for shortness of breath.   Cardiovascular:  Positive for swelling in legs/feet.  Gastrointestinal:  Positive for constipation and diarrhea.  Endocrine: Negative for excessive thirst.  Genitourinary:  Negative for difficulty urinating.  Musculoskeletal:  Positive for joint pain, joint pain, joint swelling and morning stiffness.  Skin:  Negative for rash.  Allergic/Immunologic: Negative for susceptible to infections.  Neurological:  Negative for numbness.  Hematological:  Positive for bruising/bleeding tendency.  Psychiatric/Behavioral:  Positive for sleep disturbance.    PMFS History:  Patient Active Problem List   Diagnosis Date Noted   Positive ANA (antinuclear antibody) 06/14/2021   Pain in left wrist 06/14/2021   Primary hypertension    Mixed hyperlipidemia    Chest pain 03/19/2021   Chronic pain of both ankles 11/04/2019    Past Medical History:  Diagnosis Date   DM (diabetes mellitus) ([place])  Essential tremor    head   G6PD deficiency    HTN (hypertension)    Hyperlipidemia     Family History  Problem Relation Age of Onset   Lung cancer Mother    Hypertension Mother    Hypertension Father    Colon cancer Father    HIV Brother    HIV Brother    Past Surgical History:  Procedure Laterality Date   ABDOMINAL HYSTERECTOMY     ANKLE SURGERY Right    x 2   BREAST LUMPECTOMY  Left    benign   EYE SURGERY Left    age 39 - L eye removed   REDUCTION MAMMAPLASTY Bilateral    1998   Social History   Social History Narrative   Not on file   Immunization History  Administered Date(s) Administered   PFIZER(Purple Top)SARS-COV-2 Vaccination 09/14/2019, 10/22/2019, 06/26/2020, 01/19/2021     Objective: Vital Signs: BP (!) 160/93 (BP Location: Right Arm, Patient Position: Sitting, Cuff Size: Large)   Pulse (!) 59   Resp 16   Ht 5' 7.5" (1.715 m)   Wt 275 lb (124.7 kg)   BMI 42.44 kg/m    Physical Exam Constitutional:      Appearance: She is obese.  HENT:     Mouth/Throat:     Mouth: Mucous membranes are moist.     Pharynx: Oropharynx is clear.  Eyes:     Conjunctiva/sclera: Conjunctivae normal.  Cardiovascular:     Rate and Rhythm: Normal rate and regular rhythm.  Pulmonary:     Effort: Pulmonary effort is normal.     Breath sounds: Normal breath sounds.  Skin:    General: Skin is warm and dry.     Findings: No rash.     Comments: Numerous seborrheic keratoses  Neurological:     Mental Status: She is alert.  Psychiatric:        Mood and Affect: Mood normal.     Musculoskeletal Exam:  Neck full ROM no tenderness Shoulders full ROM no tenderness or swelling Elbows full ROM no tenderness or swelling Right and left wrist mild tenderness to pressure particularly around extensor carpi radialis tendons, no palpable or visible synovitis Right 3rd PIP tenderness no synovitis and normal ROM Knees full ROM no tenderness or swelling Ankles full ROM no tenderness, trace overlying edema  Investigation: No additional findings.  Imaging: No results found.  Recent Labs: Lab Results  Component Value Date   WBC 9.1 03/20/2021   HGB 12.9 03/20/2021   PLT 179 03/20/2021   NA 136 03/19/2021   K 3.7 03/19/2021   CL 101 03/19/2021   CO2 25 03/19/2021   GLUCOSE 214 (H) 03/19/2021   BUN 17 03/19/2021   CREATININE 0.89 03/19/2021   CALCIUM 9.5  03/19/2021    Speciality Comments: No specialty comments available.  Procedures:  No procedures performed Allergies: Bee venom, Beeswax, Fish allergy, Shellfish allergy, Aspirin, Eggs or egg-derived products, Nsaids, and Sulfa antibiotics   Assessment / Plan:     Visit Diagnoses: Positive ANA (antinuclear antibody) - Plan: RNP Antibody, Anti-Smith antibody, Sjogrens syndrome-A extractable nuclear antibody, Anti-DNA antibody, double-stranded, Rheumatoid factor  Low titer positive ANA serology some described swelling changes were seen in the left wrist with good response to intra-articular steroid injection cannot confirm any other systemic connective tissue disease criteria at this time.  We will check additional ENA titers.  We will also check rheumatoid factor with the involved joint distribution.  Overall lower pretest suspicion  of systemic autoimmune disease at this time.  Chronic pain of both ankles  Chronic bilateral ankle pain sounds more consistent with use or peripheral edema or possibly some posttraumatic osteoarthritis although most recent imaging from 2021 only mild disease.  No joint inflammation at this time.  Pain in left wrist  Symptoms are much improved compared to their original intensity but no overt inflammation on exam today.  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 If no specific inflammatory serology would recommend just f/u with ortho clinic.  Orders: Orders Placed This Encounter  Procedures   RNP Antibody   Anti-Smith antibody   Sjogrens syndrome-A extractable nuclear antibody   Anti-DNA antibody, double-stranded   Rheumatoid factor    No orders of the defined types were placed in this encounter.   Follow-Up Instructions: No follow-ups on file.   Collier Salina, MD  Note - This record has been created  using [employer].  Chart creation errors have been sought, but may not always  have been located. Such creation errors do not reflect on  the standard of medical care.

## 2021-06-14 NOTE — Patient Instructions (Signed)
Asd Antinuclear Antibody Test Why am I having this test? This is a test that is used to help diagnose systemic lupus erythematosus (SLE) and other autoimmune diseases. An autoimmune disease is a disease in which the body's own defense (immune)system attacks its organs. What is being tested? This test checks for antinuclear antibodies (ANA) in the blood. The presence of ANA is associated with several autoimmune diseases. It is seen in almost all patients with lupus. What kind of sample is taken? A blood sample is required for this test. It is usually collected by inserting a needle into a blood vessel. How are the results reported? Your test results will be reported as either positive or negative. A false-positive result can occur. A false positive is incorrect because it means that a condition is present when it is not. What do the results mean? A positive test result may mean that you have: Lupus. Other autoimmune diseases, such as rheumatoid arthritis, scleroderma, or Sjgren syndrome. Conditions that may cause a false-positive result include: Liver dysfunction. Myasthenia gravis. Infectious mononucleosis. Talk with your health care provider about what your results mean. Questions to ask your health care provider Ask your health care provider, or the department that is doing the test: When will my results be ready? How will I get my results? What are my treatment options? What other tests do I need? What are my next steps? Summary This is a test that is used to help diagnose systemic lupus erythematosus (SLE) and other autoimmune diseases. An autoimmune disease is a disease in which the body's own defense (immune)system attacks the body. This test checks for antinuclear antibodies (ANA) in the blood. The presence of ANA is associated with several autoimmune diseases. It is seen in almost all patients with lupus. Your test results will be reported as either positive or negative. Talk  with your health care provider about what your results mean.  Anti-DNA Antibody Test Why am I having this test? The anti-DNA antibody test helps with the diagnosis and follow-up of systemic lupus erythematosus (SLE). It is also used to monitor treatment of this condition as the antibody decreases with successful therapy. What is being tested? This test measures the amount of anti-DNA antibody in the blood. This antibody is found in 65-80% of patients with active SLE. This antibody is not as common in patients who have other diseases. What kind of sample is taken? A blood sample is required for this test. It is usually collected by inserting a needle into a blood vessel. How are the results reported? Your test results will be reported as a value. Your test results may also be reported as positive, intermediate, or negative. Your health care provider will compare your results to normal ranges that were established after testing a large group of people (reference values). Reference values may vary among labs and hospitals. For this test, common reference values are: Positive: 10 or more international units/mL. Intermediate: 5-9 international units/mL. Negative: Less than 5 international units/mL. What do the results mean? Positive results, which are associated with results that are higher than the reference values, may indicate: Autoimmune disorders such as SLE. Infectious mononucleosis. Chronic liver conditions. Intermediate results mean that the anti-DNA antibody levels are higher than normal, but not high enough to be considered positive. Negative results mean that you do not have the anti-DNA antibody that is associated with these conditions. Talk with your health care provider about what your results mean. Questions to ask your health care provider Ask  your health care provider, or the department that is doing the test: When will my results be ready? How will I get my results? What are my  treatment options? What other tests do I need? What are my next steps? Summary The anti-DNA antibody test helps with the diagnosis and follow-up of systemic lupus erythematosus (SLE). It is also used to monitor treatment of this condition as the antibody decreases with successful therapy. This test measures the amount of anti-DNA antibody in the blood. Elevated levels of anti-DNA antibody can be seen in patients with SLE and certain other conditions. This information is not intended to replace advice given to you by your health care provider. Make sure you discuss any questions you have with your health care provider. Document Revised: 04/30/2020 Document Reviewed: 04/30/2020 Elsevier Patient Education  [place].

## 2021-06-15 LAB — ANTI-DNA ANTIBODY, DOUBLE-STRANDED: ds DNA Ab: <1 IU/mL

## 2021-06-15 LAB — SJOGRENS SYNDROME-A EXTRACTABLE NUCLEAR ANTIBODY: SSA (Ro) (ENA) Antibody, IgG: <1 AI

## 2021-06-15 LAB — RNP ANTIBODY: Ribonucleic Protein(ENA) Antibody, IgG: <1 AI

## 2021-06-15 LAB — ANTI-SMITH ANTIBODY: ENA SM Ab Ser-aCnc: <1 AI

## 2021-06-15 LAB — RHEUMATOID FACTOR: Rheumatoid fact SerPl-aCnc: <14 IU/mL (ref <14)

## 2021-07-01 DIAGNOSIS — Q111 Other anophthalmos: Secondary | ICD-10-CM | POA: Insufficient documentation

## 2021-07-01 DIAGNOSIS — H05332 Deformity of left orbit due to trauma or surgery: Secondary | ICD-10-CM | POA: Insufficient documentation

## 2021-07-07 ENCOUNTER — Ambulatory Visit (INDEPENDENT_AMBULATORY_CARE_PROVIDER_SITE_OTHER): Payer: Medicare Other | Admitting: Podiatry

## 2021-07-07 ENCOUNTER — Other Ambulatory Visit: Payer: Self-pay

## 2021-07-07 ENCOUNTER — Encounter: Payer: Self-pay | Admitting: Podiatry

## 2021-07-07 DIAGNOSIS — L600 Ingrowing nail: Secondary | ICD-10-CM | POA: Diagnosis not present

## 2021-07-07 NOTE — Progress Notes (Signed)
Subjective:   Patient ID: Wendy Obrien, female   DOB: 70 y.o.   MRN: [id number]   HPI Patient presents with painful ingrown toenail of the right big toe lateral border and states that what we did last it was very helpful but this is been bothering her and she like to know if it could be fixed permanently   ROS      Objective:  Physical Exam  Neurovascular status intact with patient found to have an incurvated lateral border the right hallux painful when pressed with no active drainage or redness noted currently and discomfort with palpation     Assessment:  Reoccurrence ingrown toenail right hallux lateral border painful when pressed     Plan:  HEP reviewed condition and I do think removal of the nail border is warranted due to the fact it reoccurred.  He wants this done and I explained procedure risk and allowed her to sign consent form and today I infiltrated the right hallux 60 mg Xylocaine Marcaine mixture sterile prep done and using sterile instrumentation removed the border of the right hallux and exposed root and applied chemical 3 applications phenol 30 seconds followed by alcohol lavage sterile dressing gave instructions on soaks and to leave dressing on 24 hours but take it off earlier if it should throb and encouraged her to call questions concerns which may arise

## 2021-07-07 NOTE — Patient Instructions (Signed)

## 2021-08-10 ENCOUNTER — Encounter (HOSPITAL_COMMUNITY): Payer: Self-pay

## 2021-08-10 ENCOUNTER — Emergency Department (HOSPITAL_COMMUNITY): Payer: Medicare Other

## 2021-08-10 ENCOUNTER — Emergency Department (HOSPITAL_COMMUNITY)
Admission: EM | Admit: 2021-08-10 | Discharge: 2021-08-10 | Discharge status: Absent without leave | Payer: Medicaid Other | Source: Home / Self Care

## 2021-08-10 ENCOUNTER — Other Ambulatory Visit: Payer: Self-pay

## 2021-08-10 ENCOUNTER — Emergency Department (HOSPITAL_COMMUNITY)
Admission: EM | Admit: 2021-08-10 | Discharge: 2021-08-10 | Disposition: A | Discharge status: Home / Self Care | Payer: Medicare Other | Attending: Emergency Medicine | Admitting: Emergency Medicine

## 2021-08-10 DIAGNOSIS — R072 Precordial pain: Secondary | ICD-10-CM

## 2021-08-10 DIAGNOSIS — Z79899 Other long term (current) drug therapy: Secondary | ICD-10-CM | POA: Diagnosis not present

## 2021-08-10 DIAGNOSIS — R519 Headache, unspecified: Secondary | ICD-10-CM

## 2021-08-10 DIAGNOSIS — Z87891 Personal history of nicotine dependence: Secondary | ICD-10-CM | POA: Diagnosis not present

## 2021-08-10 DIAGNOSIS — E119 Type 2 diabetes mellitus without complications: Secondary | ICD-10-CM | POA: Insufficient documentation

## 2021-08-10 DIAGNOSIS — I10 Essential (primary) hypertension: Secondary | ICD-10-CM | POA: Insufficient documentation

## 2021-08-10 DIAGNOSIS — Z7984 Long term (current) use of oral hypoglycemic drugs: Secondary | ICD-10-CM | POA: Diagnosis not present

## 2021-08-10 LAB — CBC WITH DIFFERENTIAL/PLATELET
Abs Immature Granulocytes: 0.04 10*3/uL (ref 0.00–0.07)
Basophils Absolute: 0 10*3/uL (ref 0.0–0.1)
Basophils Relative: 1 %
Eosinophils Absolute: 0.3 10*3/uL (ref 0.0–0.5)
Eosinophils Relative: 3 %
HCT: 40.3 % (ref 36.0–46.0)
Hemoglobin: 13.7 g/dL (ref 12.0–15.0)
Immature Granulocytes: 1 %
Lymphocytes Relative: 31 %
Lymphs Abs: 2.6 10*3/uL (ref 0.7–4.0)
MCH: 33.3 pg (ref 26.0–34.0)
MCHC: 34 g/dL (ref 30.0–36.0)
MCV: 98.1 fL (ref 80.0–100.0)
Monocytes Absolute: 0.6 10*3/uL (ref 0.1–1.0)
Monocytes Relative: 7 %
Neutro Abs: 4.7 10*3/uL (ref 1.7–7.7)
Neutrophils Relative %: 57 %
Platelets: 182 10*3/uL (ref 150–400)
RBC: 4.11 MIL/uL (ref 3.87–5.11)
RDW: 11.3 % — ABNORMAL LOW (ref 11.5–15.5)
WBC: 8.2 10*3/uL (ref 4.0–10.5)
nRBC: 0 % (ref 0.0–0.2)

## 2021-08-10 LAB — COMPREHENSIVE METABOLIC PANEL
ALT: 26 U/L (ref 0–44)
AST: 37 U/L (ref 15–41)
Albumin: 4 g/dL (ref 3.5–5.0)
Alkaline Phosphatase: 69 U/L (ref 38–126)
Anion gap: 10 (ref 5–15)
BUN: 9 mg/dL (ref 8–23)
CO2: 23 mmol/L (ref 22–32)
Calcium: 9.3 mg/dL (ref 8.9–10.3)
Chloride: 105 mmol/L (ref 98–111)
Creatinine, Ser: 0.63 mg/dL (ref 0.44–1.00)
GFR, Estimated: >60 mL/min (ref >60)
Glucose, Bld: 176 mg/dL — ABNORMAL HIGH (ref 70–99)
Potassium: 3.8 mmol/L (ref 3.5–5.1)
Sodium: 138 mmol/L (ref 135–145)
Total Bilirubin: 1.4 mg/dL — ABNORMAL HIGH (ref 0.3–1.2)
Total Protein: 7 g/dL (ref 6.5–8.1)

## 2021-08-10 LAB — TROPONIN I (HIGH SENSITIVITY)
Troponin I (High Sensitivity): 8 ng/L (ref <18)
Troponin I (High Sensitivity): 8 ng/L (ref <18)

## 2021-08-10 MED ORDER — ACETAMINOPHEN 325 MG PO TABS
650.0000 mg | ORAL_TABLET | Freq: Once | ORAL | Status: AC
  Administered 2021-08-10: 650 mg via ORAL
  Filled 2021-08-10: qty 2

## 2021-08-10 NOTE — Discharge Instructions (Signed)
Work-up for the chest pain without any acute findings.  Troponins x2 were normal and no significant change.  Blood pressure improved here on its own.  Follow back up with your primary care doctor to have blood pressure followed up would recommend a blood pressure chart being filled out each day with your blood pressure level to help your primary doctor.  For the chest pain would follow up with your cardiologist with [facility] cardiology.  Return for any new or worse symptoms.

## 2021-08-10 NOTE — ED Provider Notes (Addendum)
[facility] DEPT Provider Note   CSN: [id number] Arrival date & time: 08/10/21  1337     History Chief Complaint  Patient presents with   Chest Pain   Hypertension    Wendy Obrien is a 70 y.o. female.  Patient sent in today from primary care office for intermittent substernal chest pain and for headache and elevated blood pressure.  Patient has a known history of hypertension.  Is on hydrochlorothiazide losartan and Inderal LA for her blood pressure.  She took her meds today.  Blood pressure upon arrival was 180/79.  Patient felt fine yesterday.  At around 10 AM in the morning started with substernal chest pain right around the sternal area worse with movement of her arms.  At around 11 AM started with a headache that was sort of all over patient does not normally have headaches.  She noted that her blood pressure was high at home.  No nausea no vomiting no shortness of breath no radiation of the pain to the neck face or upper extremities.  Also denies any visual changes any speech problems any sensory or motor weakness to the upper extremities lower extremities.  Patient followed by Dr. Katharina Caper from cardiology.  In July patient had a [place] Myoview that showed no significant abnormalities.      Past Medical History:  Diagnosis Date   DM (diabetes mellitus) ([place])    Essential tremor    head   G6PD deficiency    HTN (hypertension)    Hyperlipidemia     Patient Active Problem List   Diagnosis Date Noted   Positive ANA (antinuclear antibody) 06/14/2021   Pain in left wrist 06/14/2021   Primary hypertension    Mixed hyperlipidemia    Chest pain 03/19/2021   Chronic pain of both ankles 11/04/2019    Past Surgical History:  Procedure Laterality Date   ABDOMINAL HYSTERECTOMY     ANKLE SURGERY Right    x 2   BREAST LUMPECTOMY Left    benign   EYE SURGERY Left    age 60 - L eye removed   REDUCTION MAMMAPLASTY Bilateral    1998     OB  History   No obstetric history on file.     Family History  Problem Relation Age of Onset   Lung cancer Mother    Hypertension Mother    Hypertension Father    Colon cancer Father    HIV Brother    HIV Brother     Social History   Tobacco Use   Smoking status: Former    Packs/day: 0.20    Years: 30.00    Pack years: 6.00    Types: Cigarettes    Quit date: 1998    Years since quitting: 24.9   Smokeless tobacco: Never  Vaping Use   Vaping Use: Never used  Substance Use Topics   Alcohol use: Not Currently   Drug use: Never    Home Medications Prior to Admission medications   Medication Sig Start Date End Date Taking? Authorizing Provider  acetaminophen (TYLENOL) 500 MG tablet Take 500-1,000 mg by mouth daily as needed for headache (pain).    [provider]  atorvastatin (LIPITOR) 40 MG tablet Take 40 mg by mouth daily with breakfast. 06/11/20   [provider]  BETA CAROTENE PO Take 1 tablet by mouth daily with breakfast.    [provider]  busPIRone (BUSPAR) 7.5 MG tablet Take 7.5 mg by mouth daily with breakfast.  06/11/20   [provider]  EPINEPHrine 0.3 mg/0.3 mL IJ SOAJ injection Inject into the muscle as directed. 03/23/21   [provider]  EVENING PRIMROSE OIL PO Take 1 capsule by mouth daily with breakfast. Patient not taking: Reported on 06/14/2021    [provider]  furosemide (LASIX) 20 MG tablet Take 20 mg by mouth daily as needed (feet swelling). 12/02/20   [provider]  hydrochlorothiazide (HYDRODIURIL) 25 MG tablet Take 25 mg by mouth daily with breakfast. 12/27/20   [provider]  ibuprofen (ADVIL) 600 MG tablet Take 600 mg by mouth at bedtime as needed (pain). Patient not taking: No sig reported    [provider]  latanoprost (XALATAN) 0.005 % ophthalmic solution Place 1 drop into the right eye at bedtime.    [provider]  losartan (COZAAR) 100 MG tablet Take  100 mg by mouth daily with breakfast.    [provider]  Magnesium 300 MG CAPS Take 300 mg by mouth daily as needed.    [provider]  metFORMIN (GLUCOPHAGE) 500 MG tablet Take 500 mg by mouth daily with breakfast.    [provider]  Misc Natural Products (NEURIVA) CAPS Take 1 capsule by mouth daily with breakfast.    [provider]  Multiple Vitamin (MULTIVITAMIN WITH MINERALS) TABS tablet Take 1 tablet by mouth daily with breakfast.    [provider]  Multiple Vitamins-Minerals (HAIR/SKIN/NAILS/BIOTIN) TABS Take 1 tablet by mouth daily with breakfast.    [provider]  Omega-3 Fatty Acids (FISH OIL) 1200 MG CAPS Take 1,200 mg by mouth daily with breakfast.    [provider]  Potassium Chloride ER 20 MEQ TBCR Take 20 mEq by mouth daily as needed (with each dose of Lasix). 02/27/21   [provider]  propranolol ER (INDERAL LA) 120 MG 24 hr capsule Take 120 mg by mouth at bedtime.    [provider]  SODIUM FLUORIDE, DENTAL RINSE, 0.2 % SOLN Place 1 application onto teeth every 14 (fourteen) days. 07/13/20   [provider]  timolol (TIMOPTIC) 0.5 % ophthalmic solution Place 1 drop into the right eye every morning. 02/25/21   [provider]  traZODone (DESYREL) 150 MG tablet Take 75 mg by mouth at bedtime. Patient not taking: No sig reported 06/03/20   [provider]  traZODone (DESYREL) 50 MG tablet Take 50 mg by mouth at bedtime.    [provider]    Allergies    Bee venom, Beeswax, Fish allergy, Shellfish allergy, Aspirin, Eggs or egg-derived products, Nsaids, and Sulfa antibiotics  Review of Systems   Review of Systems  Constitutional:  Negative for chills and fever.  HENT:  Negative for ear pain and sore throat.   Eyes:  Negative for pain and visual disturbance.  Respiratory:  Negative for cough and shortness of breath.   Cardiovascular:  Positive for chest pain.  Negative for palpitations.  Gastrointestinal:  Negative for abdominal pain and vomiting.  Genitourinary:  Negative for dysuria and hematuria.  Musculoskeletal:  Negative for arthralgias and back pain.  Skin:  Negative for color change and rash.  Neurological:  Positive for headaches. Negative for dizziness, seizures, syncope, facial asymmetry, speech difficulty, weakness, light-headedness and numbness.  All other systems reviewed and are negative.  Physical Exam Updated Vital Signs BP (!) 160/99   Pulse (!) 59   Temp 98.2 F (36.8 C) (Oral)   Resp (!) 21   SpO2 96%  Physical Exam Vitals and nursing note reviewed.  Constitutional:      General: She is not in acute distress.    Appearance: She is well-developed. She is obese.  HENT:     Head: Normocephalic and atraumatic.  Eyes:     Extraocular Movements: Extraocular movements intact.     Conjunctiva/sclera: Conjunctivae normal.     Pupils: Pupils are equal, round, and reactive to light.  Cardiovascular:     Rate and Rhythm: Normal rate and regular rhythm.     Heart sounds: No murmur heard. Pulmonary:     Effort: Pulmonary effort is normal. No respiratory distress.     Breath sounds: Normal breath sounds.  Abdominal:     Palpations: Abdomen is soft.     Tenderness: There is no abdominal tenderness.  Musculoskeletal:        General: No swelling.     Cervical back: Normal range of motion and neck supple.  Skin:    General: Skin is warm and dry.     Capillary Refill: Capillary refill takes less than 2 seconds.  Neurological:     General: No focal deficit present.     Mental Status: She is alert and oriented to person, place, and time.     Cranial Nerves: No cranial nerve deficit.     Sensory: No sensory deficit.     Motor: No weakness.  Psychiatric:        Mood and Affect: Mood normal.    ED Results / Procedures / Treatments   Labs (all labs ordered are listed, but only abnormal results are displayed) Labs Reviewed   COMPREHENSIVE METABOLIC PANEL - Abnormal; Notable for the following components:      Result Value   Glucose, Bld 176 (*)    Total Bilirubin 1.4 (*)    All other components within normal limits  CBC WITH DIFFERENTIAL/PLATELET  CBC WITH DIFFERENTIAL/PLATELET  CBC WITH DIFFERENTIAL/PLATELET  TROPONIN I (HIGH SENSITIVITY)  TROPONIN I (HIGH SENSITIVITY)    EKG EKG Interpretation  Date/Time:  Wednesday August 10 2021 15:57:55 EST Ventricular Rate:  59 PR Interval:  200 QRS Duration: 95 QT Interval:  441 QTC Calculation: 437 R Axis:   -18 Text Interpretation: Sinus rhythm Borderline left axis deviation Borderline T abnormalities, anterior leads No significant change since last tracing Confirmed by Fredia Sorrow [phone]) on 08/10/2021 4:01:20 PM  Radiology CT Head Wo Contrast  Result Date: 08/10/2021 CLINICAL DATA:  Headache. EXAM: CT HEAD WITHOUT CONTRAST TECHNIQUE: Contiguous axial images were obtained from the base of the skull through the vertex without intravenous contrast. COMPARISON:  None. FINDINGS: Brain: No evidence of acute infarction, hemorrhage, hydrocephalus, extra-axial collection or mass lesion/mass effect. Vascular: No hyperdense vessel or unexpected calcification. Skull: Normal. Negative for fracture or focal lesion. Sinuses/Orbits: No acute finding. Other: None. IMPRESSION: No acute intracranial abnormality seen. Electronically Signed   By: Marijo Conception M.D.   On: 08/10/2021 16:26   DG Chest Port 1 View  Result Date: 08/10/2021 CLINICAL DATA:  Chest pain EXAM: PORTABLE CHEST 1 VIEW COMPARISON:  03/19/2021 FINDINGS: The heart size and mediastinal contours are within normal limits for technique. Shallow inspiration with low lung volumes. Probable mild atelectasis left lung base. No pleural effusion or pneumothorax. The visualized skeletal structures are unremarkable. IMPRESSION: Probable mild left basilar atelectasis. Electronically Signed   By: Macy Mis M.D.    On: 08/10/2021 16:30    Procedures Procedures   Medications Ordered in ED Medications - No data to  display  ED Course  I have reviewed the triage vital signs and the nursing notes.  Pertinent labs & imaging results that were available during my care of the patient were reviewed by me and considered in my medical decision making (see chart for details).    MDM Rules/Calculators/A&P                           Patient with known history of hypertension.  Patient's comprehensive metabolic panel without any significant abnormalities other than total bili of 1.4.  Renal function is greater than 60.  CBC is still pending.  Chest x-ray negative head CT negative.  Blood pressure seems to be improving here now systolic is 219.  Will give patient Tylenol for headache.  Initial troponin was normal at 8.  Patient will require delta troponin because of the chest pain just being this morning.  Patient's cardiac risk factors include hyperlipidemia the diabetes and hypertension. Repeat troponin was 8.  Patient without any acute findings.  Blood pressure is now 138/89.  So improved without any intervention.  We will have patient follow-up with her primary care doctor as well as cardiologist.  Patient will have to have her blood pressure trended to see if that she needs any adjustments to her medications.  Final Clinical Impression(s) / ED Diagnoses Final diagnoses:  Precordial pain  Primary hypertension  Headache disorder    Rx / DC Orders ED Discharge Orders     None        Fredia Sorrow, MD 08/10/21 2013    Fredia Sorrow, MD 08/10/21 2014

## 2021-08-10 NOTE — ED Triage Notes (Signed)
EMS reports from MD office, Pt c/o intermittent center substernal chest pain with certain movements this morning, went to PCP and was sent to ED due to elevated BP.  BP 180/79 HR 54 RR 16 Sp02 96 RA CBG 216  20ga Left hand.

## 2021-09-14 ENCOUNTER — Encounter: Payer: Self-pay | Admitting: Plastic Surgery

## 2021-09-14 ENCOUNTER — Ambulatory Visit (INDEPENDENT_AMBULATORY_CARE_PROVIDER_SITE_OTHER): Payer: Medicare Other | Admitting: Plastic Surgery

## 2021-09-14 ENCOUNTER — Other Ambulatory Visit: Payer: Self-pay

## 2021-09-14 VITALS — BP 156/85 | HR 63 | Ht 68.0 in | Wt 269.6 lb

## 2021-09-14 DIAGNOSIS — R2232 Localized swelling, mass and lump, left upper limb: Secondary | ICD-10-CM

## 2021-09-14 NOTE — Progress Notes (Signed)
Referring Provider Roselee Nova, MD [place],  [place] [address]   CC:  Chief Complaint  Patient presents with   Advice Only      Wendy Obrien is an 71 y.o. female.  HPI: Patient presents to discuss a left wrist mass.  Its been present for a number of months.  She has been receiving steroid injections from her sports medicine doctor who performed an ultrasound of the dorsal left wrist and felt that there is a soft tissue mass that was about 5 or 6 cm in size.  At that visit she got a steroid injection for what sounds like de Quervain's tenosynovitis which has improved.  The dorsal left wrist is tender.  She wants to see if surgery will help her.  Allergies  Allergen Reactions   Bee Venom Anaphylaxis   Beeswax Anaphylaxis    bees   Fish Allergy Anaphylaxis   Shellfish Allergy Anaphylaxis    And all seafood   Aspirin Other (See Comments)    G6P Deficiency   Eggs Or Egg-Derived Products Hives   Nsaids Other (See Comments)    G6P Deficiency   Sulfa Antibiotics Other (See Comments)    G6P Deficiency     Outpatient Encounter Medications as of 09/14/2021  Medication Sig   acetaminophen (TYLENOL) 500 MG tablet Take 500-1,000 mg by mouth daily as needed for headache (pain).   atorvastatin (LIPITOR) 40 MG tablet Take 40 mg by mouth daily with breakfast.   BETA CAROTENE PO Take 1 tablet by mouth daily with breakfast.   busPIRone (BUSPAR) 7.5 MG tablet Take 7.5 mg by mouth daily with breakfast.   EPINEPHrine 0.3 mg/0.3 mL IJ SOAJ injection Inject into the muscle as directed.   EVENING PRIMROSE OIL PO Take 1 capsule by mouth daily with breakfast. (Patient not taking: Reported on 06/14/2021)   furosemide (LASIX) 20 MG tablet Take 20 mg by mouth daily as needed (feet swelling).   hydrochlorothiazide (HYDRODIURIL) 25 MG tablet Take 25 mg by mouth daily with breakfast.   ibuprofen (ADVIL) 600 MG tablet Take 600 mg by mouth at bedtime as needed (pain). (Patient not taking:  No sig reported)   latanoprost (XALATAN) 0.005 % ophthalmic solution Place 1 drop into the right eye at bedtime.   losartan (COZAAR) 100 MG tablet Take 100 mg by mouth daily with breakfast.   Magnesium 300 MG CAPS Take 300 mg by mouth daily as needed.   metFORMIN (GLUCOPHAGE) 500 MG tablet Take 500 mg by mouth daily with breakfast.   Misc Natural Products (NEURIVA) CAPS Take 1 capsule by mouth daily with breakfast.   Multiple Vitamin (MULTIVITAMIN WITH MINERALS) TABS tablet Take 1 tablet by mouth daily with breakfast.   Multiple Vitamins-Minerals (HAIR/SKIN/NAILS/BIOTIN) TABS Take 1 tablet by mouth daily with breakfast.   Omega-3 Fatty Acids (FISH OIL) 1200 MG CAPS Take 1,200 mg by mouth daily with breakfast.   Potassium Chloride ER 20 MEQ TBCR Take 20 mEq by mouth daily as needed (with each dose of Lasix).   propranolol ER (INDERAL LA) 120 MG 24 hr capsule Take 120 mg by mouth at bedtime.   SODIUM FLUORIDE, DENTAL RINSE, 0.2 % SOLN Place 1 application onto teeth every 14 (fourteen) days.   timolol (TIMOPTIC) 0.5 % ophthalmic solution Place 1 drop into the right eye every morning.   traZODone (DESYREL) 150 MG tablet Take 75 mg by mouth at bedtime. (Patient not taking: No sig reported)   traZODone (DESYREL) 50 MG  tablet Take 50 mg by mouth at bedtime.   No facility-administered encounter medications on file as of 09/14/2021.     Past Medical History:  Diagnosis Date   DM (diabetes mellitus) ([place])    Essential tremor    head   G6PD deficiency    HTN (hypertension)    Hyperlipidemia     Past Surgical History:  Procedure Laterality Date   ABDOMINAL HYSTERECTOMY     ANKLE SURGERY Right    x 2   BREAST LUMPECTOMY Left    benign   EYE SURGERY Left    age 62 - L eye removed   REDUCTION MAMMAPLASTY Bilateral    1998    Family History  Problem Relation Age of Onset   Lung cancer Mother    Hypertension Mother    Hypertension Father    Colon cancer Father    HIV Brother    HIV  Brother     Social History   Social History Narrative   ** Merged History Encounter **         Review of Systems General: Denies fevers, chills, weight loss CV: Denies chest pain, shortness of breath, palpitations  Physical Exam Vitals with BMI 09/14/2021 08/10/2021 08/10/2021  Height 5\' 8"  - -  Weight 269 lbs 10 oz - -  BMI 41 - -  Systolic 034 917 915  Diastolic 85 89 80  Pulse 63 60 60    General:  No acute distress,  Alert and oriented, Non-Toxic, Normal speech and affect Left hand: Fingers well-perfused normal cap refill palp radial pulse.  Sensation is intact throughout.  She has full range of motion.  There is a small bruise of the radial styloid from the steroid injection in that area symptomatically seems to be better than before.  In the dorsal left wrist there is an ill-defined area of swelling versus mass.  No obvious skin changes.  I cannot say that I can clearly feel a discrete mass on exam.  The area is tender to touch.  Assessment/Plan Patient presents with dorsal left wrist swelling versus soft tissue mass.  I explained like to get a MRI to further clarify this.  In the event that it is a soft tissue mass this should be able to be excised.  It is also possible that she has some inflammation around her extensor tendons that is causing the tenderness and swelling.  We will plan to get the MRI and go from there.  All of her questions were answered.  Cindra Presume 09/14/2021, 10:59 AM

## 2021-10-03 ENCOUNTER — Encounter (HOSPITAL_COMMUNITY): Payer: Self-pay

## 2021-10-03 ENCOUNTER — Emergency Department (HOSPITAL_COMMUNITY)
Admission: EM | Admit: 2021-10-03 | Discharge: 2021-10-04 | Disposition: A | Discharge status: Home / Self Care | Payer: Medicare Other | Attending: Emergency Medicine | Admitting: Emergency Medicine

## 2021-10-03 ENCOUNTER — Other Ambulatory Visit: Payer: Self-pay

## 2021-10-03 ENCOUNTER — Ambulatory Visit (HOSPITAL_COMMUNITY): Payer: Medicare Other

## 2021-10-03 DIAGNOSIS — R350 Frequency of micturition: Secondary | ICD-10-CM | POA: Insufficient documentation

## 2021-10-03 DIAGNOSIS — R739 Hyperglycemia, unspecified: Secondary | ICD-10-CM | POA: Diagnosis present

## 2021-10-03 LAB — URINALYSIS, ROUTINE W REFLEX MICROSCOPIC
Bilirubin Urine: NEGATIVE
Glucose, UA: >=500 mg/dL — AB
Ketones, ur: 20 mg/dL — AB
Leukocytes,Ua: NEGATIVE
Nitrite: NEGATIVE
Protein, ur: NEGATIVE mg/dL
Specific Gravity, Urine: 1.015 (ref 1.005–1.030)
pH: 5 (ref 5.0–8.0)

## 2021-10-03 LAB — COMPREHENSIVE METABOLIC PANEL
ALT: 38 U/L (ref 0–44)
AST: 44 U/L — ABNORMAL HIGH (ref 15–41)
Albumin: 4.3 g/dL (ref 3.5–5.0)
Alkaline Phosphatase: 94 U/L (ref 38–126)
Anion gap: 12 (ref 5–15)
BUN: 15 mg/dL (ref 8–23)
CO2: 19 mmol/L — ABNORMAL LOW (ref 22–32)
Calcium: 9.8 mg/dL (ref 8.9–10.3)
Chloride: 98 mmol/L (ref 98–111)
Creatinine, Ser: 0.95 mg/dL (ref 0.44–1.00)
GFR, Estimated: >60 mL/min (ref >60)
Glucose, Bld: 489 mg/dL — ABNORMAL HIGH (ref 70–99)
Potassium: 4.3 mmol/L (ref 3.5–5.1)
Sodium: 129 mmol/L — ABNORMAL LOW (ref 135–145)
Total Bilirubin: 1.4 mg/dL — ABNORMAL HIGH (ref 0.3–1.2)
Total Protein: 7.6 g/dL (ref 6.5–8.1)

## 2021-10-03 LAB — CBG MONITORING, ED
Glucose-Capillary: 473 mg/dL — ABNORMAL HIGH (ref 70–99)
Glucose-Capillary: 441 mg/dL — ABNORMAL HIGH (ref 70–99)
Glucose-Capillary: 354 mg/dL — ABNORMAL HIGH (ref 70–99)
Glucose-Capillary: 331 mg/dL — ABNORMAL HIGH (ref 70–99)

## 2021-10-03 LAB — CBC
HCT: 44.4 % (ref 36.0–46.0)
Hemoglobin: 15.4 g/dL — ABNORMAL HIGH (ref 12.0–15.0)
MCH: 32.2 pg (ref 26.0–34.0)
MCHC: 34.7 g/dL (ref 30.0–36.0)
MCV: 92.9 fL (ref 80.0–100.0)
Platelets: 193 10*3/uL (ref 150–400)
RBC: 4.78 MIL/uL (ref 3.87–5.11)
RDW: 10.8 % — ABNORMAL LOW (ref 11.5–15.5)
WBC: 10.3 10*3/uL (ref 4.0–10.5)
nRBC: 0 % (ref 0.0–0.2)

## 2021-10-03 MED ORDER — INSULIN ASPART 100 UNIT/ML IJ SOLN
12.0000 [IU] | Freq: Once | INTRAMUSCULAR | Status: AC
  Administered 2021-10-03: 12 [IU] via SUBCUTANEOUS
  Filled 2021-10-03: qty 0.12

## 2021-10-03 MED ORDER — METFORMIN HCL 500 MG PO TABS
1000.0000 mg | ORAL_TABLET | Freq: Once | ORAL | Status: AC
  Administered 2021-10-03: 1000 mg via ORAL
  Filled 2021-10-03: qty 2

## 2021-10-03 NOTE — ED Provider Notes (Addendum)
[facility] DEPT Provider Note   CSN: [id number] Arrival date & time: 10/03/21  1544     History  Chief Complaint  Patient presents with   Hyperglycemia    Wendy Obrien is a 71 y.o. female.  Patient presents chief complaint of elevated blood sugar.  She said her blood sugar at her primary care doctor's office was greater than 500 and she was sent to the ER.  She says she has been urinating more frequently the last couple of days.  She saw her urologist who thought it was a urinary tract infection that she finished a course of antibiotics.  She otherwise denies any fevers or cough denies any abdominal pain denies any vomiting or diarrhea.  She is on metformin 500 mg daily.      Home Medications Prior to Admission medications   Medication Sig Start Date End Date Taking? Authorizing Provider  acetaminophen (TYLENOL) 500 MG tablet Take 500-1,000 mg by mouth daily as needed for headache (pain).   Yes [provider]  atorvastatin (LIPITOR) 40 MG tablet Take 40 mg by mouth daily with breakfast. 06/11/20  Yes [provider]  BETA CAROTENE PO Take 1 tablet by mouth daily with breakfast.   Yes [provider]  busPIRone (BUSPAR) 7.5 MG tablet Take 7.5 mg by mouth daily with breakfast. 06/11/20  Yes [provider]  hydrochlorothiazide (HYDRODIURIL) 25 MG tablet Take 25 mg by mouth daily with breakfast. 12/27/20  Yes [provider]  latanoprost (XALATAN) 0.005 % ophthalmic solution Place 1 drop into the right eye at bedtime.   Yes [provider]  losartan (COZAAR) 100 MG tablet Take 100 mg by mouth daily with breakfast.   Yes [provider]  metFORMIN (GLUCOPHAGE) 500 MG tablet Take 500 mg by mouth daily with breakfast.   Yes [provider]  Misc Natural Products (NEURIVA) CAPS Take 1 capsule by mouth daily with breakfast.   Yes [provider]  Multiple Vitamin (MULTIVITAMIN  WITH MINERALS) TABS tablet Take 1 tablet by mouth daily with breakfast.   Yes [provider]  Multiple Vitamins-Minerals (HAIR/SKIN/NAILS/BIOTIN) TABS Take 1 tablet by mouth daily with breakfast.   Yes [provider]  Omega-3 Fatty Acids (FISH OIL) 1200 MG CAPS Take 1,200 mg by mouth daily with breakfast.   Yes [provider]  propranolol ER (INDERAL LA) 120 MG 24 hr capsule Take 120 mg by mouth at bedtime.   Yes [provider]  SODIUM FLUORIDE, DENTAL RINSE, 0.2 % SOLN Place 1 application onto teeth daily as needed (to strenghten teeth). 07/13/20  Yes [provider]  timolol (TIMOPTIC) 0.5 % ophthalmic solution Place 1 drop into the right eye every morning. 02/25/21  Yes [provider]  traZODone (DESYREL) 50 MG tablet Take 50 mg by mouth at bedtime as needed for sleep.   Yes [provider]  cefdinir (OMNICEF) 300 MG capsule Take 300 mg by mouth 2 (two) times daily. Patient not taking: Reported on 10/03/2021 09/27/21   [provider]  EPINEPHrine 0.3 mg/0.3 mL IJ SOAJ injection Inject into the muscle as directed. Patient not taking: Reported on 10/03/2021 03/23/21   [provider]  furosemide (LASIX) 20 MG tablet Take 20 mg by mouth daily as needed (feet swelling). Patient not taking: Reported on 10/03/2021 12/02/20   [provider]      Allergies    Bee venom, Beeswax, Fish allergy, Shellfish allergy, Aspirin, Eggs or egg-derived products, Nsaids,  and Sulfa antibiotics    Review of Systems   Review of Systems  Constitutional:  Negative for fever.  HENT:  Negative for ear pain.   Eyes:  Negative for pain.  Respiratory:  Negative for cough.   Cardiovascular:  Negative for chest pain.  Gastrointestinal:  Negative for abdominal pain.  Genitourinary:  Negative for flank pain.  Musculoskeletal:  Negative for back pain.  Skin:  Negative for rash.  Neurological:  Negative for headaches.   Physical  Exam Updated Vital Signs BP 131/83    Pulse (!) 53    Temp 98.1 F (36.7 C) (Oral)    Resp 17    Ht 5\' 8"  (1.727 m)    Wt 108.9 kg    SpO2 96%    BMI 36.49 kg/m  Physical Exam Constitutional:      General: She is not in acute distress.    Appearance: Normal appearance.  HENT:     Head: Normocephalic.     Nose: Nose normal.  Eyes:     Extraocular Movements: Extraocular movements intact.  Cardiovascular:     Rate and Rhythm: Normal rate.  Pulmonary:     Effort: Pulmonary effort is normal.  Abdominal:     Tenderness: There is no abdominal tenderness. There is no guarding or rebound.  Musculoskeletal:        General: Normal range of motion.     Cervical back: Normal range of motion.  Neurological:     General: No focal deficit present.     Mental Status: She is alert. Mental status is at baseline.    ED Results / Procedures / Treatments   Labs (all labs ordered are listed, but only abnormal results are displayed) Labs Reviewed  CBC - Abnormal; Notable for the following components:      Result Value   Hemoglobin 15.4 (*)    RDW 10.8 (*)    All other components within normal limits  URINALYSIS, ROUTINE W REFLEX MICROSCOPIC - Abnormal; Notable for the following components:   Glucose, UA >=500 (*)    Hgb urine dipstick SMALL (*)    Ketones, ur 20 (*)    Bacteria, UA RARE (*)    All other components within normal limits  COMPREHENSIVE METABOLIC PANEL - Abnormal; Notable for the following components:   Sodium 129 (*)    CO2 19 (*)    Glucose, Bld 489 (*)    AST 44 (*)    Total Bilirubin 1.4 (*)    All other components within normal limits  CBG MONITORING, ED - Abnormal; Notable for the following components:   Glucose-Capillary 473 (*)    All other components within normal limits  CBG MONITORING, ED - Abnormal; Notable for the following components:   Glucose-Capillary 441 (*)    All other components within normal limits  CBG MONITORING, ED - Abnormal; Notable for the  following components:   Glucose-Capillary 354 (*)    All other components within normal limits  CBG MONITORING, ED - Abnormal; Notable for the following components:   Glucose-Capillary 331 (*)    All other components within normal limits  CBG MONITORING, ED    EKG None  Radiology No results found.  Procedures Procedures    Medications Ordered in ED Medications  metFORMIN (GLUCOPHAGE) tablet 1,000 mg (has no administration in time range)  insulin aspart (novoLOG) injection 12 Units (12 Units Subcutaneous Given 10/03/21 1900)  insulin aspart (novoLOG) injection 12 Units (12 Units Subcutaneous Given 10/03/21 2032)  insulin aspart (novoLOG) injection 12 Units (12 Units Subcutaneous Given 10/03/21 2250)    ED Course/ Medical Decision Making/ A&P                           Medical Decision Making Amount and/or Complexity of Data Reviewed Labs: ordered.  Risk Prescription drug management.   Labs show glucose of 49 bicarb 19 anion gap normal 12.  Patient given several doses of subcu insulin with significantly improved blood glucose.  Given 1000 mg of metformin here in the ER.  Otherwise well-appearing tolerating oral intake and drinking over 500 cc of fluids here in the ER.  Clinically appears well-hydrated at this point.  Given able to tolerate oral hydration and increase blood sugar, I feel she is safe for continued outpatient follow-up.  Recommend increasing metformin to 1000 twice daily.  Advise close monitoring of her blood sugars at home and to return back to the ER if the blood sugars are consistently over 250 despite her new regimen.  Patient expressed understanding, discharged home in stable condition.  All questions were answered.   Final Clinical Impression(s) / ED Diagnoses Final diagnoses:  Hyperglycemia    Rx / DC Orders ED Discharge Orders     None         Luna Fuse, MD 10/03/21 2324    Luna Fuse, MD 10/03/21 [phone]

## 2021-10-03 NOTE — ED Notes (Signed)
Pt given a pitcher of water and advised that we want her to drink 528ml water

## 2021-10-03 NOTE — Discharge Instructions (Signed)
Increase your metformin to 1000mg  two times a day.  Check you blood sugars daily before meals.  If they are consistently over 250, call your primary care doctor immediately.  Return to the ER if you have fevers, pain, vomiting or any other concerns.

## 2021-10-03 NOTE — ED Provider Triage Note (Signed)
Emergency Medicine Provider Triage Evaluation Note  Wendy Obrien , a 71 y.o. female  was evaluated in triage.  Pt complains of hyperglycemia. She states that she has been feeling poorly recently with dry mouth, dry skin, and bilateral feet swelling. Went to her PCP who recommended she come here for management of her hyperglycemia. She denies fevers, chills, chest pain, shortness of breath, nausea, vomiting, diarrhea. She has T2DM and takes metformin only, no missed doses. Does not check her sugars at home  Review of Systems  Positive:  Negative: See above  Physical Exam  BP (!) 161/91 (BP Location: Right Arm)    Pulse 61    Temp 98.1 F (36.7 C) (Oral)    Resp 18    Ht 5\' 8"  (1.727 m)    Wt 108.9 kg    SpO2 99%    BMI 36.49 kg/m  Gen:   Awake, no distress   Resp:  Normal effort  MSK:   Moves extremities without difficulty  Other:    Medical Decision Making  Medically screening exam initiated at 4:29 PM.  Appropriate orders placed.  Wendy Obrien was informed that the remainder of the evaluation will be completed by another provider, this initial triage assessment does not replace that evaluation, and the importance of remaining in the ED until their evaluation is complete.     Bud Face, PA-C 10/03/21 [phone]

## 2021-10-03 NOTE — ED Triage Notes (Signed)
Pt reports going to PCP today and was told to come here for high blood glucose of 500. Pt endorses dry mouth and skin and bilateral leg swelling.

## 2021-10-04 LAB — CBG MONITORING, ED: Glucose-Capillary: 245 mg/dL — ABNORMAL HIGH (ref 70–99)

## 2021-10-05 ENCOUNTER — Observation Stay (HOSPITAL_COMMUNITY)
Admission: EM | Admit: 2021-10-05 | Discharge: 2021-10-06 | Disposition: A | Discharge status: Home / Self Care | Payer: Medicare Other | Attending: Internal Medicine | Admitting: Internal Medicine

## 2021-10-05 ENCOUNTER — Encounter (HOSPITAL_COMMUNITY): Payer: Self-pay | Admitting: Emergency Medicine

## 2021-10-05 DIAGNOSIS — G250 Essential tremor: Secondary | ICD-10-CM | POA: Diagnosis not present

## 2021-10-05 DIAGNOSIS — Z87891 Personal history of nicotine dependence: Secondary | ICD-10-CM | POA: Insufficient documentation

## 2021-10-05 DIAGNOSIS — I10 Essential (primary) hypertension: Secondary | ICD-10-CM | POA: Diagnosis not present

## 2021-10-05 DIAGNOSIS — E119 Type 2 diabetes mellitus without complications: Secondary | ICD-10-CM

## 2021-10-05 DIAGNOSIS — Z7984 Long term (current) use of oral hypoglycemic drugs: Secondary | ICD-10-CM | POA: Insufficient documentation

## 2021-10-05 DIAGNOSIS — Z79899 Other long term (current) drug therapy: Secondary | ICD-10-CM | POA: Insufficient documentation

## 2021-10-05 DIAGNOSIS — E785 Hyperlipidemia, unspecified: Secondary | ICD-10-CM | POA: Diagnosis present

## 2021-10-05 DIAGNOSIS — Z20822 Contact with and (suspected) exposure to covid-19: Secondary | ICD-10-CM | POA: Insufficient documentation

## 2021-10-05 DIAGNOSIS — E1165 Type 2 diabetes mellitus with hyperglycemia: Principal | ICD-10-CM | POA: Insufficient documentation

## 2021-10-05 DIAGNOSIS — D75A Glucose-6-phosphate dehydrogenase (G6PD) deficiency without anemia: Secondary | ICD-10-CM | POA: Diagnosis present

## 2021-10-05 DIAGNOSIS — R739 Hyperglycemia, unspecified: Secondary | ICD-10-CM | POA: Diagnosis not present

## 2021-10-05 DIAGNOSIS — H5789 Other specified disorders of eye and adnexa: Secondary | ICD-10-CM | POA: Diagnosis present

## 2021-10-05 DIAGNOSIS — N179 Acute kidney failure, unspecified: Secondary | ICD-10-CM | POA: Diagnosis not present

## 2021-10-05 LAB — CBC
HCT: 39.4 % (ref 36.0–46.0)
Hemoglobin: 14.1 g/dL (ref 12.0–15.0)
MCH: 32.3 pg (ref 26.0–34.0)
MCHC: 35.8 g/dL (ref 30.0–36.0)
MCV: 90.2 fL (ref 80.0–100.0)
Platelets: 181 10*3/uL (ref 150–400)
RBC: 4.37 MIL/uL (ref 3.87–5.11)
RDW: 11.1 % — ABNORMAL LOW (ref 11.5–15.5)
WBC: 10.9 10*3/uL — ABNORMAL HIGH (ref 4.0–10.5)
nRBC: 0 % (ref 0.0–0.2)

## 2021-10-05 LAB — CBC WITH DIFFERENTIAL/PLATELET
Abs Immature Granulocytes: 0.02 10*3/uL (ref 0.00–0.07)
Basophils Absolute: 0 10*3/uL (ref 0.0–0.1)
Basophils Relative: 0 %
Eosinophils Absolute: 0.2 10*3/uL (ref 0.0–0.5)
Eosinophils Relative: 2 %
HCT: 43.9 % (ref 36.0–46.0)
Hemoglobin: 15.6 g/dL — ABNORMAL HIGH (ref 12.0–15.0)
Immature Granulocytes: 0 %
Lymphocytes Relative: 32 %
Lymphs Abs: 3 10*3/uL (ref 0.7–4.0)
MCH: 32.6 pg (ref 26.0–34.0)
MCHC: 35.5 g/dL (ref 30.0–36.0)
MCV: 91.8 fL (ref 80.0–100.0)
Monocytes Absolute: 0.7 10*3/uL (ref 0.1–1.0)
Monocytes Relative: 8 %
Neutro Abs: 5.4 10*3/uL (ref 1.7–7.7)
Neutrophils Relative %: 58 %
Platelets: 205 10*3/uL (ref 150–400)
RBC: 4.78 MIL/uL (ref 3.87–5.11)
RDW: 11 % — ABNORMAL LOW (ref 11.5–15.5)
WBC: 9.4 10*3/uL (ref 4.0–10.5)
nRBC: 0 % (ref 0.0–0.2)

## 2021-10-05 LAB — CBG MONITORING, ED
Glucose-Capillary: 360 mg/dL — ABNORMAL HIGH (ref 70–99)
Glucose-Capillary: 351 mg/dL — ABNORMAL HIGH (ref 70–99)
Glucose-Capillary: 295 mg/dL — ABNORMAL HIGH (ref 70–99)

## 2021-10-05 LAB — URINALYSIS, ROUTINE W REFLEX MICROSCOPIC
Bilirubin Urine: NEGATIVE
Glucose, UA: >=500 mg/dL — AB
Hgb urine dipstick: NEGATIVE
Ketones, ur: 20 mg/dL — AB
Leukocytes,Ua: NEGATIVE
Nitrite: NEGATIVE
Protein, ur: NEGATIVE mg/dL
Specific Gravity, Urine: 1.011 (ref 1.005–1.030)
pH: 5 (ref 5.0–8.0)

## 2021-10-05 LAB — RESP PANEL BY RT-PCR (FLU A&B, COVID) ARPGX2
Influenza A by PCR: NEGATIVE
Influenza B by PCR: NEGATIVE
SARS Coronavirus 2 by RT PCR: NEGATIVE

## 2021-10-05 LAB — BASIC METABOLIC PANEL
Anion gap: 12 (ref 5–15)
BUN: 15 mg/dL (ref 8–23)
CO2: 19 mmol/L — ABNORMAL LOW (ref 22–32)
Calcium: 9.6 mg/dL (ref 8.9–10.3)
Chloride: 98 mmol/L (ref 98–111)
Creatinine, Ser: 0.83 mg/dL (ref 0.44–1.00)
GFR, Estimated: >60 mL/min (ref >60)
Glucose, Bld: 392 mg/dL — ABNORMAL HIGH (ref 70–99)
Potassium: 3.9 mmol/L (ref 3.5–5.1)
Sodium: 129 mmol/L — ABNORMAL LOW (ref 135–145)

## 2021-10-05 LAB — CREATININE, SERUM
Creatinine, Ser: 1.21 mg/dL — ABNORMAL HIGH (ref 0.44–1.00)
GFR, Estimated: 48 mL/min — ABNORMAL LOW (ref >60)

## 2021-10-05 LAB — GLUCOSE, CAPILLARY: Glucose-Capillary: 220 mg/dL — ABNORMAL HIGH (ref 70–99)

## 2021-10-05 MED ORDER — INSULIN GLARGINE-YFGN 100 UNIT/ML ~~LOC~~ SOLN
5.0000 [IU] | Freq: Every day | SUBCUTANEOUS | Status: DC
  Administered 2021-10-05: 22:00:00 5 [IU] via SUBCUTANEOUS
  Filled 2021-10-05 (×2): qty 0.05

## 2021-10-05 MED ORDER — ONDANSETRON 4 MG PO TBDP
4.0000 mg | ORAL_TABLET | Freq: Once | ORAL | Status: AC
  Administered 2021-10-05: 10:00:00 4 mg via ORAL
  Filled 2021-10-05: qty 1

## 2021-10-05 MED ORDER — LATANOPROST 0.005 % OP SOLN
1.0000 [drp] | Freq: Every day | OPHTHALMIC | Status: DC
  Administered 2021-10-05: 22:00:00 1 [drp] via OPHTHALMIC
  Filled 2021-10-05: qty 2.5

## 2021-10-05 MED ORDER — ONDANSETRON HCL 4 MG PO TABS: 4.0000 mg | ORAL_TABLET | Freq: Four times a day (QID) | ORAL | Status: DC | PRN

## 2021-10-05 MED ORDER — ONDANSETRON HCL 4 MG/2ML IJ SOLN: 4.0000 mg | Freq: Four times a day (QID) | INTRAMUSCULAR | Status: DC | PRN

## 2021-10-05 MED ORDER — PROPRANOLOL HCL ER 60 MG PO CP24
120.0000 mg | ORAL_CAPSULE | Freq: Every day | ORAL | Status: DC
  Administered 2021-10-05: 22:00:00 120 mg via ORAL
  Filled 2021-10-05: qty 2

## 2021-10-05 MED ORDER — TRAZODONE HCL 50 MG PO TABS: 50.0000 mg | ORAL_TABLET | Freq: Every evening | ORAL | Status: DC | PRN

## 2021-10-05 MED ORDER — ACETAMINOPHEN 325 MG PO TABS
650.0000 mg | ORAL_TABLET | Freq: Four times a day (QID) | ORAL | Status: DC | PRN
  Administered 2021-10-05 – 2021-10-06 (×2): 650 mg via ORAL
  Filled 2021-10-05 (×2): qty 2

## 2021-10-05 MED ORDER — INSULIN ASPART 100 UNIT/ML IJ SOLN
10.0000 [IU] | Freq: Once | INTRAMUSCULAR | Status: AC
  Administered 2021-10-05: 16:00:00 10 [IU] via SUBCUTANEOUS
  Filled 2021-10-05: qty 0.1

## 2021-10-05 MED ORDER — BUSPIRONE HCL 5 MG PO TABS
7.5000 mg | ORAL_TABLET | Freq: Every day | ORAL | Status: DC
  Administered 2021-10-06: 7.5 mg via ORAL
  Filled 2021-10-05: qty 2

## 2021-10-05 MED ORDER — POLYETHYLENE GLYCOL 3350 17 G PO PACK: 17.0000 g | PACK | Freq: Every day | ORAL | Status: DC | PRN

## 2021-10-05 MED ORDER — INSULIN ASPART 100 UNIT/ML IJ SOLN
0.0000 [IU] | Freq: Three times a day (TID) | INTRAMUSCULAR | Status: DC
  Administered 2021-10-05: 18:00:00 11 [IU] via SUBCUTANEOUS
  Administered 2021-10-06: 20 [IU] via SUBCUTANEOUS
  Administered 2021-10-06: 11 [IU] via SUBCUTANEOUS
  Filled 2021-10-05: qty 0.2

## 2021-10-05 MED ORDER — ATORVASTATIN CALCIUM 40 MG PO TABS
40.0000 mg | ORAL_TABLET | Freq: Every day | ORAL | Status: DC
  Administered 2021-10-06: 40 mg via ORAL
  Filled 2021-10-05: qty 1

## 2021-10-05 MED ORDER — SODIUM CHLORIDE 0.9 % IV BOLUS
1000.0000 mL | Freq: Once | INTRAVENOUS | Status: AC
  Administered 2021-10-05: 17:00:00 1000 mL via INTRAVENOUS

## 2021-10-05 MED ORDER — INSULIN ASPART 100 UNIT/ML IJ SOLN
0.0000 [IU] | Freq: Every day | INTRAMUSCULAR | Status: DC
  Administered 2021-10-05: 22:00:00 2 [IU] via SUBCUTANEOUS
  Filled 2021-10-05: qty 0.05

## 2021-10-05 MED ORDER — ADULT MULTIVITAMIN W/MINERALS CH
1.0000 | ORAL_TABLET | Freq: Every day | ORAL | Status: DC
  Administered 2021-10-06: 1 via ORAL
  Filled 2021-10-05: qty 1

## 2021-10-05 MED ORDER — INSULIN GLARGINE-YFGN 100 UNIT/ML ~~LOC~~ SOLN
5.0000 [IU] | Freq: Every day | SUBCUTANEOUS | Status: DC
  Filled 2021-10-05: qty 0.05

## 2021-10-05 MED ORDER — ENOXAPARIN SODIUM 60 MG/0.6ML IJ SOSY
60.0000 mg | PREFILLED_SYRINGE | INTRAMUSCULAR | Status: DC
  Administered 2021-10-05: 22:00:00 60 mg via SUBCUTANEOUS
  Filled 2021-10-05: qty 0.6

## 2021-10-05 MED ORDER — NAPHAZOLINE-GLYCERIN 0.012-0.25 % OP SOLN
1.0000 [drp] | Freq: Four times a day (QID) | OPHTHALMIC | Status: DC | PRN
  Filled 2021-10-05: qty 15

## 2021-10-05 MED ORDER — LOSARTAN POTASSIUM 50 MG PO TABS
100.0000 mg | ORAL_TABLET | Freq: Every day | ORAL | Status: DC
  Administered 2021-10-06: 100 mg via ORAL
  Filled 2021-10-05: qty 2

## 2021-10-05 MED ORDER — TIMOLOL MALEATE 0.5 % OP SOLN
1.0000 [drp] | Freq: Every morning | OPHTHALMIC | Status: DC
  Administered 2021-10-06: 1 [drp] via OPHTHALMIC
  Filled 2021-10-05 (×3): qty 5

## 2021-10-05 MED ORDER — ACETAMINOPHEN 650 MG RE SUPP: 650.0000 mg | Freq: Four times a day (QID) | RECTAL | Status: DC | PRN

## 2021-10-05 NOTE — Assessment & Plan Note (Signed)
Meets criteria for BMI greater than 35+ comorbidities of hypertension and diabetes

## 2021-10-05 NOTE — Assessment & Plan Note (Signed)
Continue statin. 

## 2021-10-05 NOTE — ED Triage Notes (Signed)
Per pt, states she has been having issues with here sugar being elevated-was seen on Monday for the same-states her doctor increased metformin to 1000 mg-states she is having loose stool, decreased appetite-states her PCP told her to come back to ED sue to symptoms

## 2021-10-05 NOTE — Plan of Care (Signed)
°  Problem: Education: Goal: Knowledge of General Education information will improve Description: Including pain rating scale, medication(s)/side effects and non-pharmacologic comfort measures Outcome: Progressing   Problem: Health Behavior/Discharge Planning: Goal: Ability to manage health-related needs will improve Outcome: Progressing   Problem: Clinical Measurements: Goal: Will remain free from infection Outcome: Progressing Goal: Respiratory complications will improve Outcome: Progressing Goal: Cardiovascular complication will be avoided Outcome: Progressing   Problem: Activity: Goal: Risk for activity intolerance will decrease Outcome: Progressing

## 2021-10-05 NOTE — Assessment & Plan Note (Signed)
Blood pressures stable/elevated.  Continue home medications although will hold diuretic

## 2021-10-05 NOTE — ED Notes (Signed)
Unsuccessful IV attempt in R AC 22g.

## 2021-10-05 NOTE — Assessment & Plan Note (Addendum)
Patient diagnosed with diabetes about 5 years ago.  Initially was on insulin, but then transitioned soon after to metformin.  She states that her blood sugars have been quite well controlled with an A1c around 6.  According to her, her PCP had told her earlier this week that he could not explain why her A1c had jumped up to 14.  It is possible this could be from her oral prednisone/steroid injections, with her last dose of oral prednisone a few weeks ago.  A1c at 10.7 here.  Received 5 units of Lantus on evening of 1/25.  Will give another 5 after lunch and start patient on Lantus 18 units nightly. (Will instruct the patient to take 15 units tonight only).  Also being discharged with a libre monitor for close monitoring of her CBGs.  She will follow-up with her PCP next week.

## 2021-10-05 NOTE — Assessment & Plan Note (Addendum)
Saline eyedrops, redness resolved

## 2021-10-05 NOTE — H&P (Signed)
History and Physical    Patient: Wendy Obrien UQJ:[id number] DOB: 07-23-1951 DOA: 10/05/2021 DOS: the patient was seen and examined on 10/05/2021 PCP: Roselee Nova, MD  Patient coming from: Home  Chief Complaint: High blood sugars  HPI: [name] is a 71 year old female with past medical history of morbid obesity, diabetes mellitus (that is actually been well controlled on metformin) and hypertension who for the past month or so has been having issues with her wrist receiving steroid injections and then oral prednisone with plans for surgery soon.  Patient's diabetes has been well controlled on 500 mg of metformin daily.  Apparently, she has been having some high blood sugars and went to see her PCP a few days ago.  At that time, an A1c was checked and found to be markedly elevated at 14.4. (We do not have those records, but an A1c done last summer noted to be 5.9.)  Patient sent back to the emergency room today and at this time, labs otherwise unremarkable.  Not in DKA.  Patient given insulin.  And given that this is her second visit with persistently elevated blood sugars for unclear reason, hospitalist called for further evaluation.  Patient complains of feeling a little off, slightly lightheaded as well as some frequent urination.  Review of Systems: As mentioned in the history of present illness. All other systems reviewed and are negative.  Patient also noted to complain of some right eye irritation and redness.  This only began in the last 12 hours Past Medical History:  Diagnosis Date   DM (diabetes mellitus) ([place])    Essential tremor    head   G6PD deficiency    HTN (hypertension)    Hyperlipidemia    Past Surgical History:  Procedure Laterality Date   ABDOMINAL HYSTERECTOMY     ANKLE SURGERY Right    x 2   BREAST LUMPECTOMY Left    benign   EYE SURGERY Left    age 74 - L eye removed   REDUCTION MAMMAPLASTY Bilateral    1998   Social History:  reports that she  quit smoking about 25 years ago. Her smoking use included cigarettes. She has a 6.00 pack-year smoking history. She has never used smokeless tobacco. She reports that she does not currently use alcohol. She reports that she does not use drugs.  Ambulates without assistance.  Lives at home with her husband.  Allergies  Allergen Reactions   Bee Venom Anaphylaxis   Beeswax Anaphylaxis    bees   Fish Allergy Anaphylaxis   Shellfish Allergy Anaphylaxis    And all seafood   Aspirin Other (See Comments)    G6P Deficiency   Eggs Or Egg-Derived Products Hives   Nsaids Other (See Comments)    G6P Deficiency   Sulfa Antibiotics Other (See Comments)    G6P Deficiency     Family History  Problem Relation Age of Onset   Lung cancer Mother    Hypertension Mother    Hypertension Father    Colon cancer Father    HIV Brother    HIV Brother     Prior to Admission medications   Medication Sig Start Date End Date Taking? Authorizing Provider  acetaminophen (TYLENOL) 500 MG tablet Take 500-1,000 mg by mouth daily as needed for headache (pain).    [provider]  atorvastatin (LIPITOR) 40 MG tablet Take 40 mg by mouth daily with breakfast. 06/11/20   [provider]  BETA CAROTENE PO Take 1  tablet by mouth daily with breakfast.    [provider]  busPIRone (BUSPAR) 7.5 MG tablet Take 7.5 mg by mouth daily with breakfast. 06/11/20   [provider]  hydrochlorothiazide (HYDRODIURIL) 25 MG tablet Take 25 mg by mouth daily with breakfast. 12/27/20   [provider]  latanoprost (XALATAN) 0.005 % ophthalmic solution Place 1 drop into the right eye at bedtime.    [provider]  losartan (COZAAR) 100 MG tablet Take 100 mg by mouth daily with breakfast.    [provider]  metFORMIN (GLUCOPHAGE) 500 MG tablet Take 500 mg by mouth daily with breakfast.    [provider]  Misc Natural Products (NEURIVA) CAPS Take 1 capsule by mouth  daily with breakfast.    [provider]  Multiple Vitamin (MULTIVITAMIN WITH MINERALS) TABS tablet Take 1 tablet by mouth daily with breakfast.    [provider]  Multiple Vitamins-Minerals (HAIR/SKIN/NAILS/BIOTIN) TABS Take 1 tablet by mouth daily with breakfast.    [provider]  Omega-3 Fatty Acids (FISH OIL) 1200 MG CAPS Take 1,200 mg by mouth daily with breakfast.    [provider]  propranolol ER (INDERAL LA) 120 MG 24 hr capsule Take 120 mg by mouth at bedtime.    [provider]  SODIUM FLUORIDE, DENTAL RINSE, 0.2 % SOLN Place 1 application onto teeth daily as needed (to strenghten teeth). 07/13/20   [provider]  timolol (TIMOPTIC) 0.5 % ophthalmic solution Place 1 drop into the right eye every morning. 02/25/21   [provider]  traZODone (DESYREL) 50 MG tablet Take 50 mg by mouth at bedtime as needed for sleep.    [provider]    Physical Exam: Vitals:   10/05/21 0922 10/05/21 1107 10/05/21 1347 10/05/21 1807  BP: (!) 166/98 116/74 (!) 143/77   Pulse: 65 64 (!) 55   Resp: 18 16 16    Temp: 97.7 F (36.5 C)   97.9 F (36.6 C)  TempSrc: Oral   Oral  SpO2: 94% 95% 99%   Weight: 117.9 kg     Height: 5\' 8"  (1.727 m)      General: Alert and oriented x3, no acute distress HEENT: Normocephalic and atraumatic, mucous membranes are slightly dry.  She has a left prosthetic eye.  Right eye appears to be slightly injected and irritated.  Neck is supple, no JVD Cardiovascular: Regular rate and rhythm, S1-S2 Lungs: Clear to auscultation bilaterally Abdomen: Soft, nontender, nondistended, positive bowel sounds Extremities: No clubbing or cyanosis or edema Skin: No skin breaks, tears or lesions Neuro: No focal deficits Psychiatry: Patient is appropriate, no evidence of psychoses  Data Reviewed:  Lab work reviewed noteworthy for elevated CBGs in the 300s to 400s.  Assessment/Plan DM (diabetes mellitus)  ([place]) Patient diagnosed with diabetes about 5 years ago.  Initially was on insulin, but then transitioned soon after to metformin.  She states that her blood sugars have been quite well controlled with an A1c around 6.  According to her, her PCP had told her earlier this week that he could not explain why her A1c had jumped up to 14.  It is possible this could be from her oral prednisone/steroid injections, with her last dose of oral prednisone a few weeks ago.  In the meantime, have started Lantus plus sliding scale.  Checking A1c.  She likely will go home on insulin.  Essential tremor- (present on admission) Continue propranolol  Hyperlipidemia- (present on admission) Continue statin  Morbid obesity ([place])- (present on admission) Meets criteria for BMI greater than 35+ comorbidities of hypertension and diabetes  HTN (hypertension)- (present on admission) Blood pressures stable/elevated.  Continue home medications although will hold diuretic  Right eye irritation: I have ordered some saline  Advance Care Planning:   Code Status: Prior full code  Consults: None  Family Communication: Left message for husband  Severity of Illness: The appropriate patient status for this patient is OBSERVATION. Observation status is judged to be reasonable and necessary in order to provide the required intensity of service to ensure the patient's safety. The patient's presenting symptoms, physical exam findings, and initial radiographic and laboratory data in the context of their medical condition is felt to place them at decreased risk for further clinical deterioration. Furthermore, it is anticipated that the patient will be medically stable for discharge from the hospital within 2 midnights of admission.   Author: Annita Brod, MD 10/05/2021 6:33 PM  For on call review [email or web address].

## 2021-10-05 NOTE — Assessment & Plan Note (Signed)
-  Continue propranolol 

## 2021-10-05 NOTE — Hospital Course (Addendum)
71 year old female with past medical history of morbid obesity, diabetes mellitus (that is actually been well controlled on metformin) and hypertension who for the past month or so has been having issues with her wrist receiving steroid injections and then oral prednisone with plans for surgery soon.  Patient's diabetes has been well controlled on 500 mg of metformin daily.  Apparently, she has been having some high blood sugars and went to see her PCP a few days ago.  At that time, an A1c was checked and found to be markedly elevated at 14.4. (We do not have those records, but an A1c done last summer noted to be 5.9.)  Patient sent back to the emergency room today and at this time, labs otherwise unremarkable.  Not in DKA.  Patient given insulin.  And given that this is her second visit with persistently elevated blood sugars for unclear reason, hospitalist called for further evaluation.  Following admission, CBG still stayed elevated to 300s.  A1c came back at 10.7.  Patient seen by diabetes coordinator.  Plan will be to start patient on Lantus only at 18 units nightly.  She will have a libre monitor to make sure her sugars are followed closely and she will follow-up with her PCP in 1 week.

## 2021-10-05 NOTE — ED Provider Triage Note (Signed)
Emergency Medicine Provider Triage Evaluation Note  Wendy Obrien , a 71 y.o. female  was evaluated in triage.  Pt complains of nausea and vomiting and increased blood sugars.  Patient was here on 10/03/2021 for hyperglycemia.  She was given a total of 36 units of insulin to bring her sugars down, but she did not warrant admission at that time.  Her metformin dose was doubled to maintain her sugars at home.  Since then she has been having significant nausea and vomiting, unable to keep down any food or water.  Additionally, she has had multiple blood sugars above 300 and was told to return to the ED if she had to greater than 250.  She has been compliant on her metformin.  She also endorses some epigastric abdominal pain.  Denies diarrhea constipation.  Review of Systems  Positive: Epigastric pain, nausea, vomiting, hyperglycemia Negative: Diarrhea, constipation  Physical Exam  BP (!) 166/98 (BP Location: Left Arm)    Pulse 65    Temp 97.7 F (36.5 C) (Oral)    Resp 18    Ht 5\' 8"  (1.727 m)    Wt 117.9 kg    SpO2 94%    BMI 39.53 kg/m  Gen:   Awake, no distress   Resp:  Normal effort  MSK:   Moves extremities without difficulty  Other:    Medical Decision Making  Medically screening exam initiated at 10:02 AM.  Appropriate orders placed.  Wendy Obrien was informed that the remainder of the evaluation will be completed by another provider, this initial triage assessment does not replace that evaluation, and the importance of remaining in the ED until their evaluation is complete.     [name], [place] 10/05/21 1003

## 2021-10-05 NOTE — ED Provider Notes (Signed)
[facility] DEPT Provider Note   CSN: [id number] Arrival date & time: 10/05/21  9741     History  Chief Complaint  Patient presents with   Hyperglycemia    Wendy Obrien is a 71 y.o. female.  Patient presents ER chief complaint of persistent high blood sugar, dehydration, increased urination and loose stools.  She was seen here a few days ago with similar complaints and discharged home with instructions to increase her metformin from 500 once daily to 1000 twice daily.  She called her primary care doctor when her blood sugars continue to be high and she was advised to go back to the ER.  Otherwise no reports of fevers or cough no vomiting.      Home Medications Prior to Admission medications   Medication Sig Start Date End Date Taking? Authorizing Provider  acetaminophen (TYLENOL) 500 MG tablet Take 500-1,000 mg by mouth daily as needed for headache (pain).    [provider]  atorvastatin (LIPITOR) 40 MG tablet Take 40 mg by mouth daily with breakfast. 06/11/20   [provider]  BETA CAROTENE PO Take 1 tablet by mouth daily with breakfast.    [provider]  busPIRone (BUSPAR) 7.5 MG tablet Take 7.5 mg by mouth daily with breakfast. 06/11/20   [provider]  cefdinir (OMNICEF) 300 MG capsule Take 300 mg by mouth 2 (two) times daily. Patient not taking: Reported on 10/03/2021 09/27/21   [provider]  EPINEPHrine 0.3 mg/0.3 mL IJ SOAJ injection Inject into the muscle as directed. Patient not taking: Reported on 10/03/2021 03/23/21   [provider]  furosemide (LASIX) 20 MG tablet Take 20 mg by mouth daily as needed (feet swelling). Patient not taking: Reported on 10/03/2021 12/02/20   [provider]  hydrochlorothiazide (HYDRODIURIL) 25 MG tablet Take 25 mg by mouth daily with breakfast. 12/27/20   [provider]  latanoprost (XALATAN) 0.005 % ophthalmic solution Place 1 drop  into the right eye at bedtime.    [provider]  losartan (COZAAR) 100 MG tablet Take 100 mg by mouth daily with breakfast.    [provider]  metFORMIN (GLUCOPHAGE) 500 MG tablet Take 500 mg by mouth daily with breakfast.    [provider]  Misc Natural Products (NEURIVA) CAPS Take 1 capsule by mouth daily with breakfast.    [provider]  Multiple Vitamin (MULTIVITAMIN WITH MINERALS) TABS tablet Take 1 tablet by mouth daily with breakfast.    [provider]  Multiple Vitamins-Minerals (HAIR/SKIN/NAILS/BIOTIN) TABS Take 1 tablet by mouth daily with breakfast.    [provider]  Omega-3 Fatty Acids (FISH OIL) 1200 MG CAPS Take 1,200 mg by mouth daily with breakfast.    [provider]  propranolol ER (INDERAL LA) 120 MG 24 hr capsule Take 120 mg by mouth at bedtime.    [provider]  SODIUM FLUORIDE, DENTAL RINSE, 0.2 % SOLN Place 1 application onto teeth daily as needed (to strenghten teeth). 07/13/20   [provider]  timolol (TIMOPTIC) 0.5 % ophthalmic solution Place 1 drop into the right eye every morning. 02/25/21   [provider]  traZODone (DESYREL) 50 MG tablet Take 50 mg by mouth at bedtime as needed for sleep.    [provider]      Allergies    Bee venom, Beeswax, Fish allergy, Shellfish allergy, Aspirin, Eggs or egg-derived products, Nsaids, and Sulfa antibiotics    Review of Systems  Review of Systems  Constitutional:  Negative for fever.  HENT:  Negative for ear pain.   Eyes:  Negative for pain.  Respiratory:  Negative for cough.   Cardiovascular:  Negative for chest pain.  Gastrointestinal:  Negative for abdominal pain.  Genitourinary:  Negative for flank pain.  Musculoskeletal:  Negative for back pain.  Skin:  Negative for rash.  Neurological:  Negative for headaches.   Physical Exam Updated Vital Signs BP (!) 143/77    Pulse (!) 55    Temp 97.7 F (36.5 C)  (Oral)    Resp 16    Ht 5\' 8"  (1.727 m)    Wt 117.9 kg    SpO2 99%    BMI 39.53 kg/m  Physical Exam Constitutional:      General: She is not in acute distress.    Appearance: Normal appearance.  HENT:     Head: Normocephalic.     Nose: Nose normal.  Eyes:     Extraocular Movements: Extraocular movements intact.  Cardiovascular:     Rate and Rhythm: Normal rate.  Pulmonary:     Effort: Pulmonary effort is normal.  Musculoskeletal:        General: Normal range of motion.     Cervical back: Normal range of motion.  Neurological:     General: No focal deficit present.     Mental Status: She is alert. Mental status is at baseline.    ED Results / Procedures / Treatments   Labs (all labs ordered are listed, but only abnormal results are displayed) Labs Reviewed  BASIC METABOLIC PANEL - Abnormal; Notable for the following components:      Result Value   Sodium 129 (*)    CO2 19 (*)    Glucose, Bld 392 (*)    All other components within normal limits  CBC WITH DIFFERENTIAL/PLATELET - Abnormal; Notable for the following components:   Hemoglobin 15.6 (*)    RDW 11.0 (*)    All other components within normal limits  CBG MONITORING, ED - Abnormal; Notable for the following components:   Glucose-Capillary 360 (*)    All other components within normal limits  CBG MONITORING, ED - Abnormal; Notable for the following components:   Glucose-Capillary 351 (*)    All other components within normal limits  RESP PANEL BY RT-PCR (FLU A&B, COVID) ARPGX2  URINALYSIS, ROUTINE W REFLEX MICROSCOPIC  CBG MONITORING, ED    EKG None  Radiology No results found.  Procedures Procedures    Medications Ordered in ED Medications  sodium chloride 0.9 % bolus 1,000 mL (has no administration in time range)  insulin aspart (novoLOG) injection 10 Units (has no administration in time range)  ondansetron (ZOFRAN-ODT) disintegrating tablet 4 mg (4 mg Oral Given 10/05/21 0954)    ED Course/  Medical Decision Making/ A&P                           Medical Decision Making Amount and/or Complexity of Data Reviewed Independent Historian: spouse Labs: ordered. Decision-making details documented in ED Course.  Risk Prescription drug management. Decision regarding hospitalization. Risk Details: Patient appears to have persistent hyperglycemia.  She will likely need insulin or other blood sugar management plan.  Consult Hospitalist team for persistent hyperglycemia and dehydration.   Repeat labs show persistent hyperglycemia.  Bicarb is also low at 19.        Final Clinical Impression(s) / ED Diagnoses Final diagnoses:  None  Rx / DC Orders ED Discharge Orders     None         Luna Fuse, MD 10/05/21 [phone]

## 2021-10-06 DIAGNOSIS — R739 Hyperglycemia, unspecified: Secondary | ICD-10-CM | POA: Diagnosis not present

## 2021-10-06 DIAGNOSIS — N179 Acute kidney failure, unspecified: Secondary | ICD-10-CM

## 2021-10-06 DIAGNOSIS — E1165 Type 2 diabetes mellitus with hyperglycemia: Secondary | ICD-10-CM | POA: Diagnosis not present

## 2021-10-06 LAB — BASIC METABOLIC PANEL
Anion gap: 11 (ref 5–15)
BUN: 20 mg/dL (ref 8–23)
CO2: 20 mmol/L — ABNORMAL LOW (ref 22–32)
Calcium: 8.9 mg/dL (ref 8.9–10.3)
Chloride: 102 mmol/L (ref 98–111)
Creatinine, Ser: 1.16 mg/dL — ABNORMAL HIGH (ref 0.44–1.00)
GFR, Estimated: 50 mL/min — ABNORMAL LOW (ref >60)
Glucose, Bld: 246 mg/dL — ABNORMAL HIGH (ref 70–99)
Potassium: 3.6 mmol/L (ref 3.5–5.1)
Sodium: 133 mmol/L — ABNORMAL LOW (ref 135–145)

## 2021-10-06 LAB — CBC
HCT: 38.4 % (ref 36.0–46.0)
Hemoglobin: 13.3 g/dL (ref 12.0–15.0)
MCH: 32 pg (ref 26.0–34.0)
MCHC: 34.6 g/dL (ref 30.0–36.0)
MCV: 92.3 fL (ref 80.0–100.0)
Platelets: 156 10*3/uL (ref 150–400)
RBC: 4.16 MIL/uL (ref 3.87–5.11)
RDW: 11.2 % — ABNORMAL LOW (ref 11.5–15.5)
WBC: 8.4 10*3/uL (ref 4.0–10.5)
nRBC: 0 % (ref 0.0–0.2)

## 2021-10-06 LAB — GLUCOSE, CAPILLARY
Glucose-Capillary: 292 mg/dL — ABNORMAL HIGH (ref 70–99)
Glucose-Capillary: 377 mg/dL — ABNORMAL HIGH (ref 70–99)

## 2021-10-06 LAB — HEMOGLOBIN A1C
Hgb A1c MFr Bld: 10.7 % — ABNORMAL HIGH (ref 4.8–5.6)
Mean Plasma Glucose: 260 mg/dL

## 2021-10-06 MED ORDER — INSULIN GLARGINE 100 UNIT/ML SOLOSTAR PEN
18.0000 [IU] | PEN_INJECTOR | Freq: Every day | SUBCUTANEOUS | 11 refills | Status: DC
Start: 2021-10-06 — End: 2021-11-01

## 2021-10-06 MED ORDER — LIVING WELL WITH DIABETES BOOK
Freq: Once | Status: DC
  Filled 2021-10-06 (×2): qty 1

## 2021-10-06 MED ORDER — INSULIN GLARGINE-YFGN 100 UNIT/ML ~~LOC~~ SOLN
5.0000 [IU] | SUBCUTANEOUS | Status: AC
  Administered 2021-10-06: 5 [IU] via SUBCUTANEOUS
  Filled 2021-10-06: qty 0.05

## 2021-10-06 MED ORDER — SODIUM CHLORIDE 0.9 % IV SOLN: INTRAVENOUS | Status: DC

## 2021-10-06 MED ORDER — IBUPROFEN 100 MG/5ML PO SUSP
100.0000 mg | Freq: Three times a day (TID) | ORAL | Status: AC | PRN
  Administered 2021-10-06: 100 mg via ORAL
  Filled 2021-10-06: qty 5

## 2021-10-06 NOTE — Discharge Summary (Addendum)
Physician Discharge Summary   Patient: Wendy Obrien MRN: [id number] DOB: Sep 05, 1951  Admit date:     10/05/2021  Discharge date: 10/06/21  Discharge Physician: Annita Brod   PCP: Roselee Nova, MD   Recommendations at discharge:   Medication change: Metformin discontinued New medication: Lantus 15 units 1/26 night only, followed by 18 units nightly starting 1/27 night Addendum: Following discharge, advised patient that should she have issues to call the floor and they will page me.  She had so day after discharge informing me she continued to have elevated blood sugars in the 300s.  Advised adjustment in her Lantus and to call the following day with blood sugars.  We have spoken for several days in a row after her discharge noting elevated blood sugars and so we have been titrating up her Lantus until she is able to see her primary care physician on Wednesday, 2/1.  By the night of 1/31, we have titrated up patient's Lantus to 25 units subcu twice daily.  Suspect she may benefit from some metformin to decrease fat cell resistance to insulin, but can defer this to her primary care physician.  Discharge Diagnoses Principal Problem:   Hyperglycemia Active Problems:   DM (diabetes mellitus) (HCC)   Essential tremor   Hyperlipidemia   HTN (hypertension)   Morbid obesity (HCC)   G6PD deficiency  Resolved Problems:   Redness of right eye   AKI (acute kidney injury) [facility])   Hospital Course   71 year old female with past medical history of morbid obesity, diabetes mellitus (that is actually been well controlled on metformin) and hypertension who for the past month or so has been having issues with her wrist receiving steroid injections and then oral prednisone with plans for surgery soon.  Patient's diabetes has been well controlled on 500 mg of metformin daily.  Apparently, she has been having some high blood sugars and went to see her PCP a few days ago.  At that time, an A1c  was checked and found to be markedly elevated at 14.4. (We do not have those records, but an A1c done last summer noted to be 5.9.)  Patient sent back to the emergency room today and at this time, labs otherwise unremarkable.  Not in DKA.  Patient given insulin.  And given that this is her second visit with persistently elevated blood sugars for unclear reason, hospitalist called for further evaluation.  Following admission, CBG still stayed elevated to 300s.  A1c came back at 10.7.  Patient seen by diabetes coordinator.  Plan will be to start patient on Lantus only at 18 units nightly.  She will have a libre monitor to make sure her sugars are followed closely and she will follow-up with her PCP in 1 week.  DM (diabetes mellitus) ([place]) Patient diagnosed with diabetes about 5 years ago.  Initially was on insulin, but then transitioned soon after to metformin.  She states that her blood sugars have been quite well controlled with an A1c around 6.  According to her, her PCP had told her earlier this week that he could not explain why her A1c had jumped up to 14.  It is possible this could be from her oral prednisone/steroid injections, with her last dose of oral prednisone a few weeks ago.  A1c at 10.7 here.  Received 5 units of Lantus on evening of 1/25.  Will give another 5 after lunch and start patient on Lantus 18 units nightly. (Will instruct the patient  to take 15 units tonight only).  Also being discharged with a libre monitor for close monitoring of her CBGs.  She will follow-up with her PCP next week.  Essential tremor- (present on admission) Continue propranolol  Hyperlipidemia- (present on admission) Continue statin  Morbid obesity ([place])- (present on admission) Meets criteria for BMI greater than 35+ comorbidities of hypertension and diabetes  HTN (hypertension)- (present on admission) Blood pressures stable/elevated.  Continue home medications although will hold diuretic  AKI (acute  kidney injury) (HCC)-resolved as of 10/06/2021 Creatinine normal on admission.  Bumped to 1.21 and down to 1.16 x 1/26 morning.  May been secondary to diuresis from hyperglycemia.  Patient received IV fluids.  Redness of right eye-resolved as of 10/06/2021, (present on admission) Saline eyedrops, redness resolved       Consultants: None Procedures performed: None Disposition: Home Diet recommendation: Carb modified diet  DISCHARGE MEDICATION: Allergies as of 10/06/2021       Reactions   Bee Venom Anaphylaxis   Beeswax Anaphylaxis   bees   Fish Allergy Anaphylaxis   Shellfish Allergy Anaphylaxis   And all seafood   Aspirin Other (See Comments)   G6P Deficiency   Eggs Or Egg-derived Products Hives   Nsaids Other (See Comments)   G6P Deficiency   Sulfa Antibiotics Other (See Comments)   G6P Deficiency         Medication List     STOP taking these medications    metFORMIN 500 MG tablet Commonly known as: GLUCOPHAGE       TAKE these medications    acetaminophen 500 MG tablet Commonly known as: TYLENOL Take 500-1,000 mg by mouth daily as needed for headache (pain).   atorvastatin 40 MG tablet Commonly known as: LIPITOR Take 40 mg by mouth daily with breakfast.   BETA CAROTENE PO Take 1 tablet by mouth daily with breakfast.   busPIRone 7.5 MG tablet Commonly known as: BUSPAR Take 7.5 mg by mouth daily with breakfast.   Fish Oil 1200 MG Caps Take 1,200 mg by mouth daily with breakfast.   Hair/Skin/Nails/Biotin Tabs Take 1 tablet by mouth daily with breakfast.   hydrochlorothiazide 25 MG tablet Commonly known as: HYDRODIURIL Take 25 mg by mouth daily with breakfast.   insulin glargine 100 UNIT/ML Solostar Pen Commonly known as: LANTUS Inject 18 Units into the skin at bedtime.   latanoprost 0.005 % ophthalmic solution Commonly known as: XALATAN Place 1 drop into the right eye at bedtime.   losartan 100 MG tablet Commonly known as: COZAAR Take 100  mg by mouth daily with breakfast.   multivitamin with minerals Tabs tablet Take 1 tablet by mouth daily with breakfast.   Neuriva Caps Take 1 capsule by mouth daily with breakfast.   propranolol ER 120 MG 24 hr capsule Commonly known as: INDERAL LA Take 120 mg by mouth at bedtime.   SODIUM FLUORIDE (DENTAL RINSE) 0.2 % Soln Place 1 application onto teeth daily as needed (to strenghten teeth).   timolol 0.5 % ophthalmic solution Commonly known as: TIMOPTIC Place 1 drop into the right eye every morning.   traZODone 50 MG tablet Commonly known as: DESYREL Take 50 mg by mouth at bedtime as needed for sleep.        Follow-up Information     Roselee Nova, MD Follow up in 1 week(s).   Specialty: Family Medicine Contact information: [place] [address] [phone]         Nahser, Wonda Cheng,  MD .   Specialty: Cardiology Contact information: [address] [place] [address] [phone]                 Discharge Exam: Danley Danker Weights   10/05/21 7741  Weight: 117.9 kg   General: Alert and oriented x3, no acute distress Cardiovascular: Regular rate and rhythm, S1-S2 Lungs: Clear to auscultation bilaterally  Condition at discharge: good  The results of significant diagnostics from this hospitalization (including imaging, microbiology, ancillary and laboratory) are listed below for reference.   Imaging Studies: No results found.  Microbiology: Results for orders placed or performed during the hospital encounter of 10/05/21  Resp Panel by RT-PCR (Flu A&B, Covid) Nasopharyngeal Swab     Status: None   Collection Time: 10/05/21  5:17 PM   Specimen: Nasopharyngeal Swab; Nasopharyngeal(NP) swabs in vial transport medium  Result Value Ref Range Status   SARS Coronavirus 2 by RT PCR NEGATIVE NEGATIVE Final    Comment: (NOTE) SARS-CoV-2 target nucleic acids are NOT DETECTED.  The SARS-CoV-2 RNA is generally detectable in  upper respiratory specimens during the acute phase of infection. The lowest concentration of SARS-CoV-2 viral copies this assay can detect is 138 copies/mL. A negative result does not preclude SARS-Cov-2 infection and should not be used as the sole basis for treatment or other patient management decisions. A negative result may occur with  improper specimen collection/handling, submission of specimen other than nasopharyngeal swab, presence of viral mutation(s) within the areas targeted by this assay, and inadequate number of viral copies(<138 copies/mL). A negative result must be combined with clinical observations, patient history, and epidemiological information. The expected result is Negative.  Fact Sheet for Patients:  EntrepreneurPulse.com.au  Fact Sheet for Healthcare Providers:  IncredibleEmployment.be  This test is no t yet approved or cleared by the [place] FDA and  has been authorized for detection and/or diagnosis of SARS-CoV-2 by FDA under an Emergency Use Authorization (EUA). This EUA will remain  in effect (meaning this test can be used) for the duration of the COVID-19 declaration under Section 564(b)(1) of the Act, 21 U.S.C.section 360bbb-3(b)(1), unless the authorization is terminated  or revoked sooner.       Influenza A by PCR NEGATIVE NEGATIVE Final   Influenza B by PCR NEGATIVE NEGATIVE Final    Comment: (NOTE) The Xpert Xpress SARS-CoV-2/FLU/RSV plus assay is intended as an aid in the diagnosis of influenza from Nasopharyngeal swab specimens and should not be used as a sole basis for treatment. Nasal washings and aspirates are unacceptable for Xpert Xpress SARS-CoV-2/FLU/RSV testing.  Fact Sheet for Patients: EntrepreneurPulse.com.au  Fact Sheet for Healthcare Providers: IncredibleEmployment.be  This test is not yet approved or cleared by the [place] FDA and has been  authorized for detection and/or diagnosis of SARS-CoV-2 by FDA under an Emergency Use Authorization (EUA). This EUA will remain in effect (meaning this test can be used) for the duration of the COVID-19 declaration under Section 564(b)(1) of the Act, 21 U.S.C. section 360bbb-3(b)(1), unless the authorization is terminated or revoked.  Performed at [facility], [place] [address], [place], [place] [address]     Labs: CBC: Recent Labs  Lab 10/03/21 1648 10/05/21 1002 10/05/21 2041 10/06/21 0413  WBC 10.3 9.4 10.9* 8.4  NEUTROABS  --  5.4  --   --   HGB 15.4* 15.6* 14.1 13.3  HCT 44.4 43.9 39.4 38.4  MCV 92.9 91.8 90.2 92.3  PLT 193 205 181 156  Basic Metabolic Panel: Recent Labs  Lab 10/03/21 1648 10/05/21 1002 10/05/21 2041 10/06/21 0413  NA 129* 129*  --  133*  K 4.3 3.9  --  3.6  CL 98 98  --  102  CO2 19* 19*  --  20*  GLUCOSE 489* 392*  --  246*  BUN 15 15  --  20  CREATININE 0.95 0.83 1.21* 1.16*  CALCIUM 9.8 9.6  --  8.9   Liver Function Tests: Recent Labs  Lab 10/03/21 1648  AST 44*  ALT 38  ALKPHOS 94  BILITOT 1.4*  PROT 7.6  ALBUMIN 4.3   CBG: Recent Labs  Lab 10/05/21 1307 10/05/21 1756 10/05/21 2148 10/06/21 0747 10/06/21 1134  GLUCAP 351* 295* 220* 292* 377*    Discharge time spent: less than 30 minutes.  Signed: Annita Brod, MD [facility] 10/06/2021

## 2021-10-06 NOTE — Progress Notes (Signed)
°  Transition of Care (TOC) Screening Note   Patient Details  Name: Wendy Obrien Date of Birth: [birth year]   Transition of Care [facility]) CM/SW Contact:    Lennart Pall, LCSW Phone Number: 10/06/2021, 12:32 PM    Transition of Care Department [facility]) has reviewed patient and no TOC needs have been identified at this time. We will continue to monitor patient advancement through interdisciplinary progression rounds. If new patient transition needs arise, please place a TOC consult.

## 2021-10-06 NOTE — Progress Notes (Signed)
Discharge instructions discussed with patient and family, verbalized agreement and understanding 

## 2021-10-06 NOTE — Discharge Instructions (Signed)
Tonight (1/26 only), take 15 units of Lantus.  (Then starting 1/27 night, take 18 units nightly)

## 2021-10-06 NOTE — Assessment & Plan Note (Addendum)
Creatinine normal on admission.  Bumped to 1.21 and down to 1.16 x 1/26 morning.  May been secondary to diuresis from hyperglycemia.  Patient received IV fluids.

## 2021-10-06 NOTE — Consult Note (Addendum)
Inpatient Diabetes Program Recommendations  AACE/ADA: New Consensus Statement on Inpatient Glycemic Control (2015)  Target Ranges:  Prepandial:   less than 140 mg/dL      Peak postprandial:   less than 180 mg/dL (1-2 hours)      Critically ill patients:  140 - 180 mg/dL   Lab Results  Component Value Date   GLUCAP 377 (H) 10/06/2021   HGBA1C 10.7 (H) 10/05/2021    Review of Glycemic Control  Latest Reference Range & Units 10/05/21 09:25 10/05/21 13:07 10/05/21 17:56 10/05/21 21:48 10/06/21 07:47 10/06/21 11:34  Glucose-Capillary 70 - 99 mg/dL 360 (H) 351 (H) 295 (H) 220 (H) 292 (H) 377 (H)  (H): Data is abnormally high  Diabetes history: DM2 Outpatient Diabetes medications: Metformin 1000 mg BID Current orders for Inpatient glycemic control: Semglee 5 units QD, Novolog 0-20 units TID and 0-5 QHS  Inpatient Diabetes Program Recommendations:    Semglee 18 units QD (0.15 units/kg) Novolog 0-15 units TID  Novolog 2 units TID with meals  Spoke with patient and husband at bedside.  Reviewed patient's current A1c of 10.7% (average blood sugar of 260 mg/dL).  Explained what a A1c is and what it measures. Also reviewed goal A1c with patient, importance of good glucose control @ home, and blood sugar goals.  She states she has had 2 steroid injection in the last couple of months and was on a prednisone taper about 3 weeks ago.  She did not finish the taper.  She has been having increased thirst, polyuria, and a 10 lb weight loss .  She has been on basal/bolus in the past when she was first diagnosed with DM2.  She was able to come off of her insulin after 3 mos.  She know how to use the insulin pen and sliding scale.  We discussed hypoglycemia, signs, symptoms and treatment.  Discusse short and long term complications of uncontrolled glucose.    Ordered LWWD, insulin pen starter kit and added education to AVS.    She is interested in the [employer].  Will ask MD for order on day of  discharge.  Addendum @ 1246:  VO received to place [employer].  Patient likely to DC today Per MD.  Would recommend basal insulin at DC, monitor CGM glucose closely.  Alarms set for low-69 mg/dL, and high for 240 mg/dL.  Addendum @ 1441:  Placed Freestyle on back of left arm.  Educated her on how to apply, use and remove.  Provided her with an extra sensor.  She will make an appointment with her PCP for next week.  She will record her sensor readings and log food.  She will call her PCP if her glucose is consistently less than 100 mg/dL or greater than 200 mg/dL.  She is aware how to treat hypoglycemia.  We reviewed how to administer insulin using an insulin pen.    Will continue to follow while inpatient.  Thank you, Reche Dixon, MSN, RN Diabetes Coordinator Inpatient Diabetes Program [phone] (team pager from 8a-5p)

## 2021-10-11 ENCOUNTER — Other Ambulatory Visit: Payer: Self-pay

## 2021-10-11 ENCOUNTER — Ambulatory Visit (INDEPENDENT_AMBULATORY_CARE_PROVIDER_SITE_OTHER): Payer: Medicare Other | Admitting: Cardiovascular Disease

## 2021-10-11 ENCOUNTER — Encounter: Payer: Self-pay | Admitting: Cardiovascular Disease

## 2021-10-11 DIAGNOSIS — I5032 Chronic diastolic (congestive) heart failure: Secondary | ICD-10-CM | POA: Diagnosis not present

## 2021-10-11 NOTE — Patient Instructions (Signed)
Medication Instructions:  Your physician recommends that you continue on your current medications as directed. Please refer to the Current Medication list given to you today.  *If you need a refill on your cardiac medications before your next appointment, please call your pharmacy*   Lab Work: None ordered  If you have labs (blood work) drawn today and your tests are completely normal, you will receive your results only by: [place] (if you have MyChart) OR A paper copy in the mail If you have any lab test that is abnormal or we need to change your treatment, we will call you to review the results.   Testing/Procedures: None ordered   Follow-Up: At [facility], you and your health needs are our priority.  As part of our continuing mission to provide you with exceptional heart care, we have created designated Provider Care Teams.  These Care Teams include your primary Cardiologist (physician) and Advanced Practice Providers (APPs -  Physician Assistants and Nurse Practitioners) who all work together to provide you with the care you need, when you need it.  We recommend signing up for the patient portal called "MyChart".  Sign up information is provided on this After Visit Summary.  MyChart is used to connect with patients for Virtual Visits (Telemedicine).  Patients are able to view lab/test results, encounter notes, upcoming appointments, etc.  Non-urgent messages can be sent to your provider as well.   To learn more about what you can do with MyChart, go to [email or web address].    Your next appointment:   6 month(s)  The format for your next appointment:   In Person  Provider:   Mertie Moores, MD  or Robbie Lis, PA-C, Christen Bame, NP, or Richardson Dopp, PA-C       If primary card or EP is not listed click here to update    :1}    Other Instructions

## 2021-10-11 NOTE — Progress Notes (Signed)
Cardiology Office Note:    Date:  10/11/2021   ID:  Wendy Obrien, DOB 1950-09-13, MRN [id number]  PCP:  Roselee Nova, MD   [place] Providers Cardiologist:  Mertie Moores, MD     Referring MD: Roselee Nova, MD   Chief Complaint  Patient presents with   Congestive Heart Failure    Oct. 3, 2022   Wendy Obrien is a 71 y.o. female with a hx of HTN. She was admitted to the hospital with chest pain in July. Leane Call from March 20, 2021 showed no ischemia and was normal .  She seems to be ok now Is very active - tend to her grandchildren Gets fatigued by 3 PM in the afternoon .   HR is slow - is on inderal LA for essential tremor   October 11, 2021:  Wendy Obrien  is seen today for follow-up of her hypertension, congestive heart failure. Lexiscan Myoview study from July, 2022 was normal without any evidence of ischemia or previous infarction. Echocardiogram showed normal left ventricular systolic function with EF of 60 to 65%.  She has grade 1 diastolic dysfunction.  She had mild mitral regurgitation, trivial aortic insufficiency  She was in the hospital last week for hyperglycemia   Past Medical History:  Diagnosis Date   DM (diabetes mellitus) ([place])    Essential tremor    head   G6PD deficiency    HTN (hypertension)    Hyperlipidemia     Past Surgical History:  Procedure Laterality Date   ABDOMINAL HYSTERECTOMY     ANKLE SURGERY Right    x 2   BREAST LUMPECTOMY Left    benign   EYE SURGERY Left    age 86 - L eye removed   REDUCTION MAMMAPLASTY Bilateral    1998    Current Medications: Current Meds  Medication Sig   acetaminophen (TYLENOL) 500 MG tablet Take 500-1,000 mg by mouth daily as needed for headache (pain).   atorvastatin (LIPITOR) 40 MG tablet Take 40 mg by mouth daily with breakfast.   BETA CAROTENE PO Take 1 tablet by mouth daily with breakfast.   busPIRone (BUSPAR) 7.5 MG tablet Take 7.5 mg by mouth daily with breakfast.    hydrochlorothiazide (HYDRODIURIL) 25 MG tablet Take 25 mg by mouth daily with breakfast.   insulin glargine (LANTUS) 100 UNIT/ML Solostar Pen Inject 18 Units into the skin at bedtime. (Patient taking differently: Inject 22 Units into the skin at bedtime.)   latanoprost (XALATAN) 0.005 % ophthalmic solution Place 1 drop into the right eye at bedtime.   losartan (COZAAR) 100 MG tablet Take 100 mg by mouth daily with breakfast.   Misc Natural Products (NEURIVA) CAPS Take 1 capsule by mouth daily with breakfast.   Multiple Vitamin (MULTIVITAMIN WITH MINERALS) TABS tablet Take 1 tablet by mouth daily with breakfast.   Multiple Vitamins-Minerals (HAIR/SKIN/NAILS/BIOTIN) TABS Take 1 tablet by mouth daily with breakfast.   Omega-3 Fatty Acids (FISH OIL) 1200 MG CAPS Take 1,200 mg by mouth daily with breakfast.   propranolol ER (INDERAL LA) 120 MG 24 hr capsule Take 120 mg by mouth at bedtime.   SODIUM FLUORIDE, DENTAL RINSE, 0.2 % SOLN Place 1 application onto teeth daily as needed (to strenghten teeth).   timolol (TIMOPTIC) 0.5 % ophthalmic solution Place 1 drop into the right eye every morning.   traZODone (DESYREL) 50 MG tablet Take 50 mg by mouth at bedtime as needed for sleep.     Allergies:  Bee venom, Beeswax, Fish allergy, Shellfish allergy, Aspirin, Eggs or egg-derived products, Nsaids, and Sulfa antibiotics   Social History   Socioeconomic History   Marital status: Married    Spouse name: Not on file   Number of children: Not on file   Years of education: Not on file   Highest education level: Not on file  Occupational History   Not on file  Tobacco Use   Smoking status: Former    Packs/day: 0.20    Years: 30.00    Pack years: 6.00    Types: Cigarettes    Quit date: 59    Years since quitting: 25.0   Smokeless tobacco: Never  Vaping Use   Vaping Use: Never used  Substance and Sexual Activity   Alcohol use: Not Currently   Drug use: Never   Sexual activity: Yes  Other  Topics Concern   Not on file  Social History Narrative   ** Merged History Encounter **       Social Determinants of Health   Financial Resource Strain: Not on file  Food Insecurity: Not on file  Transportation Needs: Not on file  Physical Activity: Not on file  Stress: Not on file  Social Connections: Not on file     Family History: The patient's family history includes Colon cancer in her father; HIV in her brother and brother; Hypertension in her father and mother; Lung cancer in her mother.  ROS:   Please see the history of present illness.     All other systems reviewed and are negative.  EKGs/Labs/Other Studies Reviewed:    The following studies were reviewed today:   EKG:   Recent Labs: 06/13/2021: TSH 2.130 10/03/2021: ALT 38 10/06/2021: BUN 20; Creatinine, Ser 1.16; Hemoglobin 13.3; Platelets 156; Potassium 3.6; Sodium 133  Recent Lipid Panel    Component Value Date/Time   CHOL 99 03/20/2021 0020   TRIG 228 (H) 03/20/2021 0020   HDL 36 (L) 03/20/2021 0020   CHOLHDL 2.8 03/20/2021 0020   VLDL 46 (H) 03/20/2021 0020   LDLCALC 17 03/20/2021 0020     Risk Assessment/Calculations:           Physical Exam:     Physical Exam: Blood pressure 140/76, pulse 62, height 5\' 8"  (1.727 m), weight 265 lb 3.2 oz (120.3 kg), SpO2 98 %.  GEN:    elderly , moderately obese female  in no acute distress HEENT: Normal NECK: No JVD; No carotid bruits LYMPHATICS: No lymphadenopathy CARDIAC: RRR , no murmurs, rubs, gallops RESPIRATORY:  Clear to auscultation without rales, wheezing or rhonchi  ABDOMEN: Soft, non-tender, non-distended MUSCULOSKELETAL:  No edema; No deformity  SKIN: Warm and dry NEUROLOGIC:  Alert and oriented x 3   ASSESSMENT:    No diagnosis found.  PLAN:       Chronic diastolic congestive heart failure:   Seems to be doing well No further issues with dyspnea or leg edema   She will ask Dr. Manuella Ghazi  about starting Vania Rea or Wilder Glade   2.   Hypertension:   Still eating some salty foods on a regular basis.  I suspect that her blood pressure will improve if she avoids salt and continues to exercise.       Medication Adjustments/Labs and Tests Ordered: Current medicines are reviewed at length with the patient today.  Concerns regarding medicines are outlined above.  No orders of the defined types were placed in this encounter.   No orders of the defined types  were placed in this encounter.        Signed, Mertie Moores, MD  10/11/2021 11:32 AM    [place]

## 2021-10-14 ENCOUNTER — Other Ambulatory Visit: Payer: Self-pay | Admitting: Family Medicine

## 2021-10-14 DIAGNOSIS — Z1231 Encounter for screening mammogram for malignant neoplasm of breast: Secondary | ICD-10-CM

## 2021-11-01 ENCOUNTER — Other Ambulatory Visit: Payer: Self-pay

## 2021-11-01 ENCOUNTER — Ambulatory Visit (INDEPENDENT_AMBULATORY_CARE_PROVIDER_SITE_OTHER): Payer: Medicare Other | Admitting: Family

## 2021-11-01 ENCOUNTER — Encounter: Payer: Self-pay | Admitting: Family

## 2021-11-01 VITALS — BP 138/82 | HR 55 | Temp 96.1°F | Resp 18 | Ht 68.0 in | Wt 269.8 lb

## 2021-11-01 DIAGNOSIS — E782 Mixed hyperlipidemia: Secondary | ICD-10-CM

## 2021-11-01 DIAGNOSIS — D75A Glucose-6-phosphate dehydrogenase (G6PD) deficiency without anemia: Secondary | ICD-10-CM

## 2021-11-01 DIAGNOSIS — Z6841 Body Mass Index (BMI) 40.0 and over, adult: Secondary | ICD-10-CM

## 2021-11-01 DIAGNOSIS — I10 Essential (primary) hypertension: Secondary | ICD-10-CM

## 2021-11-01 DIAGNOSIS — E1165 Type 2 diabetes mellitus with hyperglycemia: Secondary | ICD-10-CM | POA: Diagnosis not present

## 2021-11-01 DIAGNOSIS — I5032 Chronic diastolic (congestive) heart failure: Secondary | ICD-10-CM | POA: Diagnosis not present

## 2021-11-01 NOTE — Progress Notes (Signed)
Provider: Marlowe Sax FNP-C   Onita Pfluger, Nelda Bucks, NP  Patient Care Team: Rasheen Schewe, Nelda Bucks, NP as PCP - General (Family Medicine) Nahser, Wendy Cheng, MD as PCP - Cardiology (Cardiology) Tat, Eustace Quail, DO as Consulting Physician (Neurology)  Extended Emergency Contact Information Primary Emergency Contact: Obrien,Wendy Address: [address] [place], [place] [address] [name] of [place] Phone: [phone] Mobile Phone: [phone] Relation: Spouse Secondary Emergency Contact: [place] Mobile Phone: [phone] Relation: Other  Code Status:  Full code  Goals of care: Advanced Directive information Advanced Directives 11/01/2021  Does Patient Have a Medical Advance Directive? No  Would patient like information on creating a medical advance directive? No - Patient declined     Chief Complaint  Patient presents with   Establish Care    New Patient.    HPI:  Pt is a 71 y.o. female seen today establish care here at [facility] for medical management of chronic diseases.Has a medical history of Hypertension,Hyperlipidemia,type 2 Diabetes mellitus,Congestive Heart Failure EF 60 - 65 %,G 6 Pd deficiency,Glaucoma,Essential tremor,Morbid Obesity among others.    Grade 1 diastolic dysfunction with mild mitral regurgitation and trivial insufficiency.     Type 2 DM - status post hospital admission 10/05/2021 - 10/06/2021 for Hyperglycemia.CBG was 532 and Hgb A 1 C 14.4  in the hospital.states had wrist pain and was receiving steroid injection and then oral prednisone with plans for surgery.Her CBG was well controlled before on Metformin which was discontinued during Hospitalization.she was started on Lantus.states CBG at home now runs in the 180's -200's. Her previous Hg A 1 C was 5.9 .  Tries to eat few carbo's and does not deep fry food.Also includes vegetables in her diet.  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 no peripheral neuropathy.   Essential tremors - saw a Neurologist in [place] was started on Inderal which has been effective.Most head tremors and some on hands.   Has not had Influenza vaccine due to allergies to eggs. She due for PNA  vaccine.States has had COVID-19 vaccine will call with administration dates.  States had Zoster vaccine in [place].she will call back to office with dates.   Has a history of smoking 1/2 pack for 15 years quit in 1998  She does not drink alcohol.   Past Medical History:  Diagnosis Date   DM (diabetes mellitus) ([place])    Essential tremor    head   G6PD deficiency    Glaucoma    Per new patient form   High cholesterol    Per new patient form   HTN (hypertension)    Hyperlipidemia    Past Surgical History:  Procedure Laterality Date   ABDOMINAL HYSTERECTOMY     Dr. Owens Shark / Per new patient form   ANKLE SURGERY Right    x 2   BREAST LUMPECTOMY Left    benign   DG  BONE DENSITY ([place] HX)     Per new patient form   DIAGNOSTIC MAMMOGRAM     Per new patient form   EYE SURGERY Left    age 31 - L eye removed, Dr. Drenda Freeze / Per new patient form   REDUCTION MAMMAPLASTY Bilateral    1998    Allergies  Allergen Reactions   Bee Venom Anaphylaxis   Beeswax Anaphylaxis    bees  Fish Allergy Anaphylaxis   Shellfish Allergy Anaphylaxis    And all seafood   Aspirin Other (See Comments)    G6P Deficiency   Eggs Or Egg-Derived Products Hives   Nsaids Other (See Comments)    G6P Deficiency   Sulfa Antibiotics Other (See Comments)    G6P Deficiency     Allergies as of 11/01/2021       Reactions   Bee Venom Anaphylaxis   Beeswax Anaphylaxis   bees   Fish Allergy Anaphylaxis   Shellfish Allergy Anaphylaxis   And all seafood   Aspirin Other (See Comments)   G6P Deficiency   Eggs Or Egg-derived Products Hives   Nsaids Other (See Comments)   G6P Deficiency   Sulfa Antibiotics Other (See Comments)   G6P  Deficiency         Medication List        Accurate as of November 01, 2021 10:01 AM. If you have any questions, ask your nurse or doctor.          acetaminophen 500 MG tablet Commonly known as: TYLENOL Take 500-1,000 mg by mouth daily as needed for headache (pain).   atorvastatin 40 MG tablet Commonly known as: LIPITOR Take 40 mg by mouth daily with breakfast.   BETA CAROTENE PO Take 1 tablet by mouth daily with breakfast.   BIOTIN PO Take by mouth daily.   busPIRone 7.5 MG tablet Commonly known as: BUSPAR Take 7.5 mg by mouth daily with breakfast.   Fish Oil 1200 MG Caps Take 1,200 mg by mouth daily with breakfast.   Hair/Skin/Nails/Biotin Tabs Take 1 tablet by mouth daily with breakfast.   hydrochlorothiazide 25 MG tablet Commonly known as: HYDRODIURIL Take 25 mg by mouth daily with breakfast.   insulin glargine 100 unit/mL Sopn Commonly known as: LANTUS Inject 27 Units into the skin in the morning and at bedtime. AM &PM What changed: Another medication with the same name was removed. Continue taking this medication, and follow the directions you see here. Changed by: Sandrea Hughs, NP   latanoprost 0.005 % ophthalmic solution Commonly known as: XALATAN Place 1 drop into the right eye at bedtime.   losartan 100 MG tablet Commonly known as: COZAAR Take 100 mg by mouth daily with breakfast.   multivitamin with minerals Tabs tablet Take 1 tablet by mouth daily with breakfast.   Neuriva Caps Take 1 capsule by mouth daily with breakfast.   propranolol ER 120 MG 24 hr capsule Commonly known as: INDERAL LA Take 120 mg by mouth at bedtime.   SODIUM FLUORIDE (DENTAL RINSE) 0.2 % Soln Place 1 application onto teeth daily as needed (to strenghten teeth).   timolol 0.5 % ophthalmic solution Commonly known as: TIMOPTIC Place 1 drop into the right eye every morning.   traZODone 50 MG tablet Commonly known as: DESYREL Take 50 mg by mouth at bedtime as  needed for sleep.        Review of Systems  Constitutional:  Negative for appetite change, chills, fatigue, fever and unexpected weight change.  HENT:  Negative for congestion, dental problem, ear discharge, ear pain, facial swelling, hearing loss, nosebleeds, postnasal drip, rhinorrhea, sinus pressure, sinus pain, sneezing, sore throat, tinnitus and trouble swallowing.   Eyes:  Positive for visual disturbance. Negative for pain, discharge, redness and itching.       Glaucoma follows up Dr.Groat   Respiratory:  Negative for cough, chest tightness, shortness of breath and wheezing.   Cardiovascular:  Negative  for chest pain, palpitations and leg swelling.  Gastrointestinal:  Positive for constipation. Negative for abdominal distention, abdominal pain, blood in stool, diarrhea, nausea and vomiting.       Hx hemorrhoids sometimes.   Endocrine: Negative for cold intolerance, heat intolerance, polydipsia, polyphagia and polyuria.  Genitourinary:  Negative for difficulty urinating, dysuria, flank pain, frequency and urgency.  Musculoskeletal:  Negative for arthralgias, back pain, gait problem, joint swelling, myalgias, neck pain and neck stiffness.  Skin:  Negative for color change, pallor, rash and wound.  Neurological:  Negative for dizziness, syncope, speech difficulty, weakness, light-headedness, numbness and headaches.  Hematological:  Does not bruise/bleed easily.  Psychiatric/Behavioral:  Negative for agitation, behavioral problems, confusion, hallucinations, self-injury, sleep disturbance and suicidal ideas. The patient is not nervous/anxious.        Sometimes feels depressed cannot do what she used to feels upset.Feels stressed out with 8 children. Does bible study,prays and talks to a friend.   Has had some memory loss tends to forget small things.   Immunization History  Administered Date(s) Administered   PFIZER(Purple Top)SARS-COV-2 Vaccination 09/14/2019, 10/22/2019, 06/26/2020,  01/19/2021   Pneumococcal Polysaccharide-23 06/11/2020   Pertinent  Health Maintenance Due  Topic Date Due   FOOT EXAM  Never done   OPHTHALMOLOGY EXAM  Never done   COLONOSCOPY (Pts 45-24yr Insurance coverage will need to be confirmed)  Never done   INFLUENZA VACCINE  Never done   HEMOGLOBIN A1C  04/04/2022   MAMMOGRAM  11/23/2022   DEXA SCAN  Completed   Fall Risk 10/03/2021 10/05/2021 10/05/2021 10/06/2021 11/01/2021  Falls in the past year? - - - - 0  Was there an injury with Fall? - - - - 0  Fall Risk Category Calculator - - - - 0  Fall Risk Category - - - - Low  Patient Fall Risk Level Low fall risk Low fall risk Low fall risk Low fall risk Low fall risk  Patient at Risk for Falls Due to - - - - No Fall Risks  Fall risk Follow up - - - - Falls evaluation completed   Functional Status Survey:    Vitals:   11/01/21 0927  BP: 138/82  Pulse: (!) 55  Resp: 18  Temp: (!) 96.1 F (35.6 C)  SpO2: 96%  Weight: 269 lb 12.8 oz (122.4 kg)  Height: 5' 8"  (1.727 m)   Body mass index is 41.02 kg/m. Physical Exam Vitals reviewed.  Constitutional:      General: She is not in acute distress.    Appearance: Normal appearance. She is obese. She is not ill-appearing or diaphoretic.  HENT:     Head: Normocephalic.     Right Ear: Tympanic membrane, ear canal and external ear normal. There is no impacted cerumen.     Left Ear: Ear canal and external ear normal. There is no impacted cerumen. Tympanic membrane is perforated.     Nose: Nose normal. No congestion or rhinorrhea.     Mouth/Throat:     Mouth: Mucous membranes are moist.     Pharynx: Oropharynx is clear. No oropharyngeal exudate or posterior oropharyngeal erythema.  Eyes:     General: No scleral icterus.       Right eye: No discharge.        Left eye: No discharge.     Extraocular Movements: Extraocular movements intact.     Conjunctiva/sclera: Conjunctivae normal.     Pupils: Pupils are equal, round, and reactive to  light.  Neck:  Vascular: No carotid bruit.  Cardiovascular:     Rate and Rhythm: Normal rate and regular rhythm.     Pulses: Normal pulses.     Heart sounds: Normal heart sounds. No murmur heard.   No friction rub. No gallop.  Pulmonary:     Effort: Pulmonary effort is normal. No respiratory distress.     Breath sounds: Normal breath sounds. No wheezing, rhonchi or rales.  Chest:     Chest wall: No tenderness.  Abdominal:     General: Bowel sounds are normal. There is no distension.     Palpations: Abdomen is soft. There is no mass.     Tenderness: There is no abdominal tenderness. There is no right CVA tenderness, left CVA tenderness, guarding or rebound.  Musculoskeletal:        General: No swelling or tenderness. Normal range of motion.     Cervical back: Normal range of motion. No rigidity or tenderness.     Right lower leg: No edema.     Left lower leg: No edema.  Lymphadenopathy:     Cervical: No cervical adenopathy.  Skin:    General: Skin is warm and dry.     Coloration: Skin is not pale.     Findings: No bruising, erythema, lesion or rash.  Neurological:     Mental Status: She is alert and oriented to person, place, and time.     Cranial Nerves: No cranial nerve deficit.     Sensory: No sensory deficit.     Motor: No weakness.     Coordination: Coordination normal.     Gait: Gait normal.  Psychiatric:        Mood and Affect: Mood normal.        Speech: Speech normal.        Behavior: Behavior normal.        Thought Content: Thought content normal.        Judgment: Judgment normal.    Labs reviewed: Recent Labs    10/03/21 1648 10/05/21 1002 10/05/21 2041 10/06/21 0413  NA 129* 129*  --  133*  K 4.3 3.9  --  3.6  CL 98 98  --  102  CO2 19* 19*  --  20*  GLUCOSE 489* 392*  --  246*  BUN 15 15  --  20  CREATININE 0.95 0.83 1.21* 1.16*  CALCIUM 9.8 9.6  --  8.9   Recent Labs    08/10/21 1537 10/03/21 1648  AST 37 44*  ALT 26 38  ALKPHOS 69 94   BILITOT 1.4* 1.4*  PROT 7.0 7.6  ALBUMIN 4.0 4.3   Recent Labs    08/10/21 1845 10/03/21 1648 10/05/21 1002 10/05/21 2041 10/06/21 0413  WBC 8.2   < > 9.4 10.9* 8.4  NEUTROABS 4.7  --  5.4  --   --   HGB 13.7   < > 15.6* 14.1 13.3  HCT 40.3   < > 43.9 39.4 38.4  MCV 98.1   < > 91.8 90.2 92.3  PLT 182   < > 205 181 156   < > = values in this interval not displayed.   Lab Results  Component Value Date   TSH 2.130 06/13/2021   Lab Results  Component Value Date   HGBA1C 10.7 (H) 10/05/2021   Lab Results  Component Value Date   CHOL 99 03/20/2021   HDL 36 (L) 03/20/2021   LDLCALC 17 03/20/2021   TRIG 228 (H) 03/20/2021  CHOLHDL 2.8 03/20/2021    Significant Diagnostic Results in last 30 days:  No results found.  Assessment/Plan  1. Primary hypertension B/p well controlled  - continue to avoid salty foods. - continue on HCZT and Losartan  - CBC with Differential/Platelet; Future - CMP with eGFR([facility]); Future - TSH; Future  2. Chronic diastolic CHF (congestive heart failure) (HCC) No signs of fluid overload  Continue to monitor  - continue to follow up with Cardiologist Dr.Nasar Doren Custard last seen 10/11/2021   3. Type 2 diabetes mellitus with hyperglycemia, without long-term current use of insulin (HCC) Lab Results  Component Value Date   HGBA1C 10.7 (H) 10/05/2021  - continue on Lantus  - - Dietary modification and exercise at least 3 times per week for 30 minutes advised. - Hemoglobin A1c; Future - Ambulatory referral to Podiatry  4. Mixed hyperlipidemia LDL at goal but latest TRG were high 228  - dietary and life style modification as above  - Lipid panel; Future  5. G6PD deficiency Asymptomatic  Hgb stable   6. Morbid obesity ([place]) - Dietary modification and exercise at least 3 times per week for 30 minutes advised. - consider referral to weight management if diet and exercise not effective.  7. BMI 40.0-44.9, adult (HCC) BMI 41.02  Dietary  modification and exercise as above  Family/ staff Communication: Reviewed plan of care with patient verbalized understanding  Labs/tests ordered:  - CBC with Differential/Platelet - CMP with eGFR([facility]) - TSH - Hgb A1C - Lipid panel  Next Appointment : 3 months for medical management of chronic issues with Fasting Labs.   Sandrea Hughs, NP

## 2021-11-14 ENCOUNTER — Ambulatory Visit: Payer: Medicare Other | Admitting: Cardiovascular Disease

## 2021-11-23 ENCOUNTER — Other Ambulatory Visit: Payer: Self-pay

## 2021-11-23 ENCOUNTER — Ambulatory Visit: Payer: Medicare Other

## 2021-11-23 ENCOUNTER — Ambulatory Visit
Admission: RE | Admit: 2021-11-23 | Discharge: 2021-11-23 | Disposition: A | Discharge status: Home / Self Care | Payer: Medicare Other | Source: Ambulatory Visit | Attending: Family Medicine | Admitting: Family Medicine

## 2021-11-23 DIAGNOSIS — Z1231 Encounter for screening mammogram for malignant neoplasm of breast: Secondary | ICD-10-CM | POA: Diagnosis not present

## 2021-11-24 ENCOUNTER — Other Ambulatory Visit: Payer: Self-pay | Admitting: Family Medicine

## 2021-11-24 DIAGNOSIS — E1169 Type 2 diabetes mellitus with other specified complication: Secondary | ICD-10-CM | POA: Diagnosis not present

## 2021-11-29 ENCOUNTER — Other Ambulatory Visit: Payer: Self-pay | Admitting: Family Medicine

## 2021-11-29 DIAGNOSIS — R928 Other abnormal and inconclusive findings on diagnostic imaging of breast: Secondary | ICD-10-CM

## 2021-11-30 ENCOUNTER — Ambulatory Visit (INDEPENDENT_AMBULATORY_CARE_PROVIDER_SITE_OTHER): Payer: Medicare Other | Admitting: Podiatry

## 2021-11-30 ENCOUNTER — Encounter: Payer: Self-pay | Admitting: Podiatry

## 2021-11-30 ENCOUNTER — Other Ambulatory Visit: Payer: Self-pay

## 2021-11-30 DIAGNOSIS — M79675 Pain in left toe(s): Secondary | ICD-10-CM

## 2021-11-30 DIAGNOSIS — E1165 Type 2 diabetes mellitus with hyperglycemia: Secondary | ICD-10-CM

## 2021-11-30 DIAGNOSIS — M2142 Flat foot [pes planus] (acquired), left foot: Secondary | ICD-10-CM | POA: Diagnosis not present

## 2021-11-30 DIAGNOSIS — L600 Ingrowing nail: Secondary | ICD-10-CM

## 2021-11-30 DIAGNOSIS — M79674 Pain in right toe(s): Secondary | ICD-10-CM | POA: Diagnosis not present

## 2021-11-30 DIAGNOSIS — M2141 Flat foot [pes planus] (acquired), right foot: Secondary | ICD-10-CM

## 2021-11-30 DIAGNOSIS — B351 Tinea unguium: Secondary | ICD-10-CM | POA: Diagnosis not present

## 2021-11-30 DIAGNOSIS — E119 Type 2 diabetes mellitus without complications: Secondary | ICD-10-CM | POA: Diagnosis not present

## 2021-12-02 ENCOUNTER — Telehealth: Payer: Self-pay | Admitting: Adult Health

## 2021-12-02 ENCOUNTER — Ambulatory Visit (INDEPENDENT_AMBULATORY_CARE_PROVIDER_SITE_OTHER): Payer: Medicare Other | Admitting: Family

## 2021-12-02 ENCOUNTER — Other Ambulatory Visit: Payer: Self-pay

## 2021-12-02 ENCOUNTER — Encounter: Payer: Self-pay | Admitting: Family

## 2021-12-02 VITALS — BP 110/70 | HR 94 | Temp 96.5°F | Resp 18 | Ht 68.0 in | Wt 268.6 lb

## 2021-12-02 DIAGNOSIS — E1165 Type 2 diabetes mellitus with hyperglycemia: Secondary | ICD-10-CM

## 2021-12-02 DIAGNOSIS — R197 Diarrhea, unspecified: Secondary | ICD-10-CM | POA: Diagnosis not present

## 2021-12-02 LAB — GLUCOSE, POCT (MANUAL RESULT ENTRY): POC Glucose: 152 mg/dl — AB (ref 70–99)

## 2021-12-02 NOTE — Telephone Encounter (Signed)
Pt called to report diarrhea for the past 24 hrs. She does not have a fever or abd pain. Her blood sugar was 66 in the middle of the night. She ate and drank and repeat CBG 150s.  She took one dose of oral atropine for diarrhea which helped. She is ambulatory and not weak. Not dizzy upon standing. Advised her to call when the office opens at 8 to make an apt.  ?

## 2021-12-02 NOTE — Patient Instructions (Addendum)
-   continue Lomotil as needed for diarrhea  ?Bland Diet ?A bland diet consists of foods that are often soft and do not have a lot of fat, fiber, or extra seasonings. Foods without fat, fiber, or seasoning are easier for the body to digest. They are also less likely to irritate your mouth, throat, stomach, and other parts of your digestive system. A bland diet is sometimes called a BRAT diet. ?What is my plan? ?Your health care provider or food and nutrition specialist (dietitian) may recommend specific changes to your diet to prevent symptoms or to treat your symptoms. These changes may include: ?Eating small meals often. ?Cooking food until it is soft enough to chew easily. ?Chewing your food well. ?Drinking fluids slowly. ?Not eating foods that are very spicy, sour, or fatty. ?Not eating citrus fruits, such as oranges and grapefruit. ?What do I need to know about this diet? ?Eat a variety of foods from the bland diet food list. ?Do not follow a bland diet longer than needed. ?Ask your health care provider whether you should take vitamins or supplements. ?What foods can I eat? ?Grains ?Hot cereals, such as cream of wheat. Rice. Bread, crackers, or tortillas made from refined white flour. ?Vegetables ?Canned or cooked vegetables. Mashed or boiled potatoes. ?Fruits ?Bananas. Applesauce. Other types of cooked or canned fruit with the skin and seeds removed, such as canned peaches or pears. ?Meats and other proteins ?Scrambled eggs. Creamy peanut butter or other nut butters. Lean, well-cooked meats, such as chicken or fish. Tofu. Soups or broths. ?Dairy ?Low-fat dairy products, such as milk, cottage cheese, or yogurt. ?Beverages ?Water. Herbal tea. Apple juice. ?Fats and oils ?Mild salad dressings. [place] or olive oil. ?Sweets and desserts ?Pudding. Custard. Fruit gelatin. Ice cream. ?The items listed above may not be a complete list of recommended foods and beverages. Contact a dietitian for more options. ?What foods  are not recommended? ?Grains ?Whole grain breads and cereals. ?Vegetables ?Raw vegetables. ?Fruits ?Raw fruits, especially citrus, berries, or dried fruits. ?Dairy ?Whole fat dairy foods. ?Beverages ?Caffeinated drinks. Alcohol. ?Seasonings and condiments ?Strongly flavored seasonings or condiments. Hot sauce. Salsa. ?Other foods ?Spicy foods. Fried foods. Sour foods, such as pickled or fermented foods. Foods with high sugar content. Foods high in fiber. ?The items listed above may not be a complete list of foods and beverages to avoid. Contact a dietitian for more information. ?Summary ?A bland diet consists of foods that are often soft and do not have a lot of fat, fiber, or extra seasonings. ?Foods without fat, fiber, or seasoning are easier for the body to digest. ?Check with your health care provider to see how long you should follow this diet plan. It is not meant to be followed for long periods. ?This information is not intended to replace advice given to you by your health care provider. Make sure you discuss any questions you have with your health care provider. ?Document Revised: 09/26/2017 Document Reviewed: 09/26/2017 ?Elsevier Patient Education ? [employer]. ? ?

## 2021-12-02 NOTE — Progress Notes (Signed)
? ?Provider: Marlowe Sax FNP-C ? ?Safire Gordin, Nelda Bucks, NP ? ?Patient Care Team: ?Briah Nary, Nelda Bucks, NP as PCP - General (Family Medicine) ?Nahser, Wonda Cheng, MD as PCP - Cardiology (Cardiology) ?Ludwig Clarks, DO as Consulting Physician (Neurology) ? ?Extended Emergency Contact Information ?Primary Emergency Contact: Hou,[name] ?Address: [address] ?         [place], [place] [address] [place] ?Home Phone: [phone] ?Mobile Phone: [phone] ?Relation: Spouse ?Secondary Emergency Contact: Durning,John ?Mobile Phone: [phone] ?Relation: Other ? ?Code Status:  Full Code  ?Goals of care: Advanced Directive information ? ?  12/02/2021  ? 10:03 AM  ?Advanced Directives  ?Does Patient Have a Medical Advance Directive? No  ?Would patient like information on creating a medical advance directive? No - Patient declined  ? ? ? ?Chief Complaint  ?Patient presents with  ? Acute Visit  ?  Patient complains of blood sugar at 66 and having diarrhea. Patient states that symptoms started on Wednesday night 11/30/2021.  ? ? ?HPI:  ?Pt is a 71 y.o. female seen today for an acute visit for evaluation of diarrhea and low blood sugar x 2 days.Had diarrhea whole day on Thursday.Blood sugar was low but had two cookie and blood sugar went up. CBG ranging in the 130 -160's. ?CBG here today is 152  ?Has had no diarrhea today.Had a little abdominal pain this morning but went away. ?Has been stressed tends to have diarrhea when stressed. ? ? ?Past Medical History:  ?Diagnosis Date  ? DM (diabetes mellitus) ([place])   ? Essential tremor   ? head  ? G6PD deficiency   ? Glaucoma   ? Per new patient form  ? High cholesterol   ? Per new patient form  ? HTN (hypertension)   ? Hyperlipidemia   ? ?Past Surgical History:  ?Procedure Laterality Date  ? ABDOMINAL HYSTERECTOMY    ? Dr. Owens Shark / Per new patient form  ? ANKLE SURGERY Right   ? x 2  ? BREAST LUMPECTOMY Left   ? benign  ? DG  BONE DENSITY ([place] HX)    ? Per new  patient form  ? DIAGNOSTIC MAMMOGRAM    ? Per new patient form  ? EYE SURGERY Left   ? age 24 - L eye removed, Dr. Drenda Freeze / Per new patient form  ? REDUCTION MAMMAPLASTY Bilateral   ? 1998  ? ? ?Allergies  ?Allergen Reactions  ? Bee Venom Anaphylaxis  ? Beeswax Anaphylaxis  ?  bees  ? Fish Allergy Anaphylaxis  ? Shellfish Allergy Anaphylaxis  ?  And all seafood  ? Aspirin Other (See Comments)  ?  G6P Deficiency  ? Eggs Or Egg-Derived Products Hives  ? Nsaids Other (See Comments)  ?  G6P Deficiency  ? Sulfa Antibiotics Other (See Comments)  ?  G6P Deficiency   ? ? ?Outpatient Encounter Medications as of 12/02/2021  ?Medication Sig  ? acetaminophen (TYLENOL) 500 MG tablet Take 500-1,000 mg by mouth daily as needed for headache (pain).  ? atorvastatin (LIPITOR) 40 MG tablet Take 40 mg by mouth daily with breakfast.  ? B-D ULTRAFINE III SHORT PEN 31G X 8 MM MISC SMARTSIG:injection Twice Daily  ? BETA CAROTENE PO Take 1 tablet by mouth daily with breakfast.  ? BIOTIN PO Take by mouth daily.  ? busPIRone (BUSPAR) 7.5 MG tablet Take 7.5 mg by mouth daily with breakfast.  ? hydrochlorothiazide (HYDRODIURIL) 25 MG tablet Take 25 mg by mouth  daily with breakfast.  ? ibuprofen (ADVIL) 600 MG tablet Take 600 mg by mouth 3 (three) times daily as needed.  ? insulin glargine (LANTUS) 100 unit/mL SOPN Inject 27 Units into the skin in the morning and at bedtime. AM &PM  ? latanoprost (XALATAN) 0.005 % ophthalmic solution Place 1 drop into the right eye at bedtime.  ? losartan (COZAAR) 100 MG tablet Take 100 mg by mouth daily with breakfast.  ? Misc Natural Products (NEURIVA) CAPS Take 1 capsule by mouth daily with breakfast.  ? Multiple Vitamin (MULTIVITAMIN WITH MINERALS) TABS tablet Take 1 tablet by mouth daily with breakfast.  ? Multiple Vitamins-Minerals (HAIR/SKIN/NAILS/BIOTIN) TABS Take 1 tablet by mouth daily with breakfast.  ? Omega-3 Fatty Acids (FISH OIL) 1200 MG CAPS Take 1,200 mg by mouth daily with breakfast.  ?  propranolol ER (INDERAL LA) 120 MG 24 hr capsule Take 120 mg by mouth at bedtime.  ? SODIUM FLUORIDE, DENTAL RINSE, 0.2 % SOLN Place 1 application onto teeth daily as needed (to strenghten teeth).  ? timolol (TIMOPTIC) 0.5 % ophthalmic solution Place 1 drop into the right eye every morning.  ? traZODone (DESYREL) 50 MG tablet Take 50 mg by mouth at bedtime as needed for sleep.  ? ?No facility-administered encounter medications on file as of 12/02/2021.  ? ? ?Review of Systems  ?Constitutional:  Negative for appetite change, chills, fatigue, fever and unexpected weight change.  ?HENT:  Negative for congestion, dental problem, ear discharge, ear pain, facial swelling, hearing loss, nosebleeds, postnasal drip, rhinorrhea, sinus pressure, sinus pain, sneezing and sore throat.   ?Respiratory:  Negative for cough, chest tightness, shortness of breath and wheezing.   ?Cardiovascular:  Negative for chest pain, palpitations and leg swelling.  ?Gastrointestinal:  Negative for abdominal distention, abdominal pain, blood in stool, constipation, nausea and vomiting.  ?     Diarrhea per HPI   ?Endocrine: Negative for polydipsia, polyphagia and polyuria.  ?Genitourinary:  Negative for difficulty urinating, dysuria, flank pain, frequency and urgency.  ?Musculoskeletal:  Negative for arthralgias, back pain, gait problem, joint swelling, myalgias, neck pain and neck stiffness.  ?Skin:  Negative for color change, pallor, rash and wound.  ?Neurological:  Negative for dizziness, syncope, speech difficulty, weakness, light-headedness, numbness and headaches.  ?Psychiatric/Behavioral:  Negative for agitation, behavioral problems, confusion, hallucinations, self-injury, sleep disturbance and suicidal ideas. The patient is not nervous/anxious.   ?     Increase stress level  ? ?Immunization History  ?Administered Date(s) Administered  ? PFIZER(Purple Top)SARS-COV-2 Vaccination 09/14/2019, 10/22/2019, 11/24/2019, 12/15/2019, 06/26/2020,  01/19/2021, 06/22/2021  ? Pneumococcal Polysaccharide-23 06/11/2020  ? Zoster Recombinat (Shingrix) 09/14/2017, 12/03/2017  ? ?Pertinent  Health Maintenance Due  ?Topic Date Due  ? FOOT EXAM  Never done  ? OPHTHALMOLOGY EXAM  Never done  ? COLONOSCOPY (Pts 45-13yr Insurance coverage will need to be confirmed)  Never done  ? INFLUENZA VACCINE  Never done  ? HEMOGLOBIN A1C  04/04/2022  ? MAMMOGRAM  11/24/2023  ? DEXA SCAN  Completed  ? ? ?  10/05/2021  ?  9:37 AM 10/05/2021  ?  8:00 PM 10/06/2021  ?  8:30 AM 11/01/2021  ?  9:34 AM 12/02/2021  ? 10:03 AM  ?Fall Risk  ?Falls in the past year?    0 0  ?Was there an injury with Fall?    0 0  ?Fall Risk Category Calculator    0 0  ?Fall Risk Category    Low Low  ?Patient Fall Risk Level  Low fall risk Low fall risk Low fall risk Low fall risk Low fall risk  ?Patient at Risk for Falls Due to    No Fall Risks No Fall Risks  ?Fall risk Follow up    Falls evaluation completed Falls evaluation completed  ? ?Functional Status Survey: ?  ? ?Vitals:  ? 12/02/21 1013  ?BP: 110/70  ?Pulse: 94  ?Resp: 18  ?Temp: (!) 96.5 ?F (35.8 ?C)  ?SpO2: 98%  ?Weight: 268 lb 9.6 oz (121.8 kg)  ?Height: '5\' 8"'$  (1.727 m)  ? ?Body mass index is 40.84 kg/m?Marland Kitchen ?Physical Exam ?Vitals reviewed.  ?Constitutional:   ?   General: She is not in acute distress. ?   Appearance: Normal appearance. She is obese. She is not ill-appearing or diaphoretic.  ?HENT:  ?   Head: Normocephalic.  ?   Right Ear: Tympanic membrane, ear canal and external ear normal. There is no impacted cerumen.  ?   Left Ear: Tympanic membrane, ear canal and external ear normal. There is no impacted cerumen.  ?   Nose: Nose normal. No congestion or rhinorrhea.  ?   Mouth/Throat:  ?   Mouth: Mucous membranes are moist.  ?   Pharynx: Oropharynx is clear. No oropharyngeal exudate or posterior oropharyngeal erythema.  ?Eyes:  ?   General: No scleral icterus.    ?   Right eye: No discharge.     ?   Left eye: No discharge.  ?   Extraocular Movements:  Extraocular movements intact.  ?   Conjunctiva/sclera: Conjunctivae normal.  ?   Pupils: Pupils are equal, round, and reactive to light.  ?Neck:  ?   Vascular: No carotid bruit.  ?Cardiovascular:  ?   Rate and Rhy

## 2021-12-05 ENCOUNTER — Telehealth: Payer: Self-pay | Admitting: *Deleted

## 2021-12-05 NOTE — Telephone Encounter (Signed)
Check blood sugar manually twice daily for now until you get the sensor.Notify provider if blood sugar is less than 75 or > 200   ?

## 2021-12-05 NOTE — Telephone Encounter (Signed)
Patient wants to know what dosage of Insulin do you want her on. Please Advise.  ?

## 2021-12-05 NOTE — Telephone Encounter (Signed)
Patient called and stated that she saw you on 3/24. Stated that over the weekend she did a trial with her Sensor blood sugar machine vs. Sticking her finger with her Accuchek.  ? ?Stated that her sensor machine was faulty. Stated that she would check with sensor and it was 54 and then she would stick her finger at same time and it was 121.  ? ?Stated that she called the sensor company and they also went behind her and checked and they came to the conclusion also that the sensor was "faulty" and is sending her a new one.  ? ?Patient is wanting to know now, after finding this out, if she should stay on the same insulin or continue to Decrease.  ?Stated that her blood sugars, by finger stick, are staying 99% of the time in the NORMAL range.  ? ?Please Advise.  ?

## 2021-12-05 NOTE — Telephone Encounter (Signed)
Continue on current Lantus 27 units and check blood sugar manually at least for one week.Notify provider if 75 < or > 200  ?

## 2021-12-06 NOTE — Telephone Encounter (Signed)
Patient notified and agreed.  

## 2021-12-06 NOTE — Telephone Encounter (Signed)
LMOM to return call.

## 2021-12-07 ENCOUNTER — Other Ambulatory Visit: Payer: Self-pay | Admitting: Family

## 2021-12-07 DIAGNOSIS — R928 Other abnormal and inconclusive findings on diagnostic imaging of breast: Secondary | ICD-10-CM

## 2021-12-08 NOTE — Progress Notes (Signed)
ANNUAL DIABETIC FOOT EXAM ? ?Subjective: ?Wendy Obrien presents today for annual diabetic foot examination. ? ?Patient relates 5 year h/o diabetes. ? ?Patient denies any h/o foot wounds. ? ?Patient admits symptoms of ffeet stinging on occasion. ? ?Patient's blood sugar was 152 mg/dl today. Last HgA1c was 14.0%  on last hospital admission per patient recollection. ? ?Risk factors: diabetes, HTN, CHF, hyperlipidemia. ? ?Ngetich, Nelda Bucks, NP is patient's PCP.  Has upcoming appointment on December 02, 2021. ? ?Past Medical History:  ?Diagnosis Date  ? DM (diabetes mellitus) ([place])   ? Essential tremor   ? head  ? G6PD deficiency   ? Glaucoma   ? Per new patient form  ? High cholesterol   ? Per new patient form  ? HTN (hypertension)   ? Hyperlipidemia   ? ?Patient Active Problem List  ? Diagnosis Date Noted  ? Chronic diastolic CHF (congestive heart failure) ([place]) 10/11/2021  ? Hyperglycemia 10/05/2021  ? DM (diabetes mellitus) ([place])   ? Essential tremor   ? HTN (hypertension)   ? Morbid obesity ([place])   ? G6PD deficiency   ? Positive ANA (antinuclear antibody) 06/14/2021  ? Pain in left wrist 06/14/2021  ? Primary hypertension   ? Hyperlipidemia   ? Chest pain 03/19/2021  ? Chronic pain of both ankles 11/04/2019  ? ?Past Surgical History:  ?Procedure Laterality Date  ? ABDOMINAL HYSTERECTOMY    ? Dr. Owens Shark / Per new patient form  ? ANKLE SURGERY Right   ? x 2  ? BREAST LUMPECTOMY Left   ? benign  ? DG  BONE DENSITY ([place] HX)    ? Per new patient form  ? DIAGNOSTIC MAMMOGRAM    ? Per new patient form  ? EYE SURGERY Left   ? age 69 - L eye removed, Dr. Drenda Freeze / Per new patient form  ? REDUCTION MAMMAPLASTY Bilateral   ? 1998  ? ?Current Outpatient Medications on File Prior to Visit  ?Medication Sig Dispense Refill  ? acetaminophen (TYLENOL) 500 MG tablet Take 500-1,000 mg by mouth daily as needed for headache (pain).    ? atorvastatin (LIPITOR) 40 MG tablet Take 40 mg by mouth daily with breakfast.    ? B-D ULTRAFINE III  SHORT PEN 31G X 8 MM MISC SMARTSIG:injection Twice Daily    ? BETA CAROTENE PO Take 1 tablet by mouth daily with breakfast.    ? BIOTIN PO Take by mouth daily.    ? busPIRone (BUSPAR) 7.5 MG tablet Take 7.5 mg by mouth daily with breakfast.    ? hydrochlorothiazide (HYDRODIURIL) 25 MG tablet Take 25 mg by mouth daily with breakfast.    ? ibuprofen (ADVIL) 600 MG tablet Take 600 mg by mouth 3 (three) times daily as needed.    ? insulin glargine (LANTUS) 100 unit/mL SOPN Inject 27 Units into the skin in the morning and at bedtime. AM &PM    ? latanoprost (XALATAN) 0.005 % ophthalmic solution Place 1 drop into the right eye at bedtime.    ? losartan (COZAAR) 100 MG tablet Take 100 mg by mouth daily with breakfast.    ? Misc Natural Products (NEURIVA) CAPS Take 1 capsule by mouth daily with breakfast.    ? Multiple Vitamin (MULTIVITAMIN WITH MINERALS) TABS tablet Take 1 tablet by mouth daily with breakfast.    ? Multiple Vitamins-Minerals (HAIR/SKIN/NAILS/BIOTIN) TABS Take 1 tablet by mouth daily with breakfast.    ? Omega-3 Fatty Acids (FISH OIL) 1200 MG CAPS  Take 1,200 mg by mouth daily with breakfast.    ? propranolol ER (INDERAL LA) 120 MG 24 hr capsule Take 120 mg by mouth at bedtime.    ? SODIUM FLUORIDE, DENTAL RINSE, 0.2 % SOLN Place 1 application onto teeth daily as needed (to strenghten teeth).    ? timolol (TIMOPTIC) 0.5 % ophthalmic solution Place 1 drop into the right eye every morning.    ? traZODone (DESYREL) 50 MG tablet Take 50 mg by mouth at bedtime as needed for sleep.    ? ?No current facility-administered medications on file prior to visit.  ?  ?Allergies  ?Allergen Reactions  ? Bee Venom Anaphylaxis  ? Beeswax Anaphylaxis  ?  bees  ? Fish Allergy Anaphylaxis  ? Shellfish Allergy Anaphylaxis  ?  And all seafood  ? Aspirin Other (See Comments)  ?  G6P Deficiency  ? Eggs Or Egg-Derived Products Hives  ? Nsaids Other (See Comments)  ?  G6P Deficiency  ? Sulfa Antibiotics Other (See Comments)  ?  G6P  Deficiency   ? ?Social History  ? ?Occupational History  ? Not on file  ?Tobacco Use  ? Smoking status: Former  ?  Packs/day: 0.20  ?  Years: 30.00  ?  Pack years: 6.00  ?  Types: Cigarettes  ?  Quit date: 1998  ?  Years since quitting: 25.2  ? Smokeless tobacco: Never  ?Vaping Use  ? Vaping Use: Never used  ?Substance and Sexual Activity  ? Alcohol use: Not Currently  ? Drug use: Never  ? Sexual activity: Yes  ? ?Family History  ?Problem Relation Age of Onset  ? Lung cancer Mother   ? Hypertension Mother   ? Diabetes Mother   ? Hypertension Father   ? Colon cancer Father   ? Sudden Cardiac Death Father   ? HIV Brother   ? HIV Brother   ? Arthritis Maternal Grandmother   ? Heart attack Maternal Grandfather   ? ?Immunization History  ?Administered Date(s) Administered  ? PFIZER(Purple Top)SARS-COV-2 Vaccination 09/14/2019, 10/22/2019, 11/24/2019, 12/15/2019, 06/26/2020, 01/19/2021, 06/22/2021  ? Pneumococcal Polysaccharide-23 06/11/2020  ? Zoster Recombinat (Shingrix) 09/14/2017, 12/03/2017  ?  ? ?Review of Systems: Negative except as noted in the HPI.  ? ?Objective: ?There were no vitals filed for this visit. ? ?Wendy Obrien is a pleasant 71 y.o. female in NAD. AAO X 3. ? ?Vascular Examination: ?CFT <3 seconds b/l LE. Palpable DP pulse(s) b/l LE. Palpable PT pulse(s) b/l LE. Pedal hair sparse. No pain with calf compression b/l. Lower extremity skin temperature gradient within normal limits. No edema noted b/l LE. No ischemia or gangrene noted b/l LE. No cyanosis or clubbing noted b/l LE. ? ?Dermatological Examination: ?No open wounds b/l LE. No interdigital macerations noted b/l LE. Incurvated nailplate medial border left hallux, lateral border left hallux, and medial border right hallux.  Nail border hypertrophy absent. There is tenderness to palpation. Sign(s) of infection: no clinical signs of infection noted on examination today.. No hyperkeratotic nor porokeratotic lesions present on today's  visit. ? ?Neurological Examination: ?Protective sensation intact 5/5 intact bilaterally with 10g monofilament b/l. Vibratory sensation intact b/l. ? ?Musculoskeletal Examination: ?Muscle strength 5/5 to all lower extremity muscle groups bilaterally. Pes planus deformity noted bilateral LE. Patient ambulates independent of any assistive aids. ? ?Footwear Assessment: ?Does the patient wear appropriate shoes? Yes. ?Does the patient need inserts/orthotics? No. ? ? ?  Latest Ref Rng & Units 10/05/2021  ? 10:02 AM 03/20/2021  ?  12:20 AM  ?Hemoglobin A1C  ?Hemoglobin-A1c 4.8 - 5.6 % 10.7   5.9    ? ?Assessment: ?1. Pain due to onychomycosis of toenails of both feet   ?2. Ingrown nail   ?3. Pes planus of both feet   ?4. Type 2 diabetes mellitus with hyperglycemia, without long-term current use of insulin ([place])   ?5. Encounter for diabetic foot exam ([place])   ?  ?ADA Risk Categorization: ?Low Risk :  ?Patient has all of the following: ?Intact protective sensation ?No prior foot ulcer  ?No severe deformity ?Pedal pulses present ? ?Plan: ?-Patient was evaluated and treated. All patient's and/or POA's questions/concerns answered on today's visit. ?-Toenails bilateral great toes debrided in length and girth without iatrogenic bleeding with sterile nail nipper and dremel.  ?-Offending nail border debrided and curretaged bilateral great toes utilizing sterile nail nipper and currette. Border cleansed with alcohol and triple antibiotic applied. No further treatment required by patient/caregiver. ?-Patient/POA to call should there be question/concern in the interim. ?Return in about 3 months (around 03/02/2022). ? ?Marzetta Board, DPM ?

## 2021-12-22 ENCOUNTER — Ambulatory Visit
Admission: RE | Admit: 2021-12-22 | Discharge: 2021-12-22 | Disposition: A | Discharge status: Home / Self Care | Payer: Medicare Other | Source: Ambulatory Visit | Attending: Family | Admitting: Family

## 2021-12-22 DIAGNOSIS — N6489 Other specified disorders of breast: Secondary | ICD-10-CM | POA: Diagnosis not present

## 2021-12-22 DIAGNOSIS — R928 Other abnormal and inconclusive findings on diagnostic imaging of breast: Secondary | ICD-10-CM

## 2021-12-25 DIAGNOSIS — E1169 Type 2 diabetes mellitus with other specified complication: Secondary | ICD-10-CM | POA: Diagnosis not present

## 2021-12-29 NOTE — Telephone Encounter (Signed)
According to the records your COVID-19 vaccine are up to date.Once the next dose is approved you can get vaccine at your pharmacy.  ?

## 2022-01-19 ENCOUNTER — Other Ambulatory Visit: Payer: Self-pay | Admitting: *Deleted

## 2022-01-19 MED ORDER — PROPRANOLOL HCL ER 120 MG PO CP24: 120.0000 mg | ORAL_CAPSULE | Freq: Every day | ORAL | 1 refills | Status: DC

## 2022-01-19 NOTE — Telephone Encounter (Signed)
Patient requested refill ?Pended Rx and sent to Amy for approval ([facility] out of office) ?

## 2022-01-24 DIAGNOSIS — E1169 Type 2 diabetes mellitus with other specified complication: Secondary | ICD-10-CM | POA: Diagnosis not present

## 2022-01-31 ENCOUNTER — Ambulatory Visit (INDEPENDENT_AMBULATORY_CARE_PROVIDER_SITE_OTHER): Payer: Medicare Other | Admitting: Family

## 2022-01-31 ENCOUNTER — Encounter: Payer: Self-pay | Admitting: Family

## 2022-01-31 DIAGNOSIS — Z23 Encounter for immunization: Secondary | ICD-10-CM | POA: Diagnosis not present

## 2022-01-31 DIAGNOSIS — E782 Mixed hyperlipidemia: Secondary | ICD-10-CM

## 2022-01-31 DIAGNOSIS — E1165 Type 2 diabetes mellitus with hyperglycemia: Secondary | ICD-10-CM | POA: Diagnosis not present

## 2022-01-31 DIAGNOSIS — D75A Glucose-6-phosphate dehydrogenase (G6PD) deficiency without anemia: Secondary | ICD-10-CM | POA: Diagnosis not present

## 2022-01-31 DIAGNOSIS — I10 Essential (primary) hypertension: Secondary | ICD-10-CM

## 2022-01-31 DIAGNOSIS — I5032 Chronic diastolic (congestive) heart failure: Secondary | ICD-10-CM | POA: Diagnosis not present

## 2022-01-31 DIAGNOSIS — G250 Essential tremor: Secondary | ICD-10-CM

## 2022-01-31 MED ORDER — TETANUS-DIPHTH-ACELL PERTUSSIS 5-2.5-18.5 LF-MCG/0.5 IM SUSP: 0.5000 mL | Freq: Once | INTRAMUSCULAR | 0 refills | Status: AC

## 2022-01-31 MED ORDER — PROPRANOLOL HCL ER 120 MG PO CP24: 120.0000 mg | ORAL_CAPSULE | Freq: Every day | ORAL | 1 refills | Status: DC
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
## 2022-02-01 LAB — COMPLETE METABOLIC PANEL WITH GFR
AG Ratio: 1.6 (calc) (ref 1.0–2.5)
ALT: 20 U/L (ref 6–29)
AST: 22 U/L (ref 10–35)
Albumin: 4.2 g/dL (ref 3.6–5.1)
Alkaline phosphatase (APISO): 73 U/L (ref 37–153)
BUN: 13 mg/dL (ref 7–25)
CO2: 30 mmol/L (ref 20–32)
Calcium: 9.8 mg/dL (ref 8.6–10.4)
Chloride: 105 mmol/L (ref 98–110)
Creat: 0.77 mg/dL (ref 0.60–1.00)
Globulin: 2.7 g/dL (calc) (ref 1.9–3.7)
Glucose, Bld: 131 mg/dL — ABNORMAL HIGH (ref 65–99)
Potassium: 4.3 mmol/L (ref 3.5–5.3)
Sodium: 141 mmol/L (ref 135–146)
Total Bilirubin: 0.8 mg/dL (ref 0.2–1.2)
Total Protein: 6.9 g/dL (ref 6.1–8.1)
eGFR: 82 mL/min/{1.73_m2} (ref >=60)

## 2022-02-01 LAB — CBC WITH DIFFERENTIAL/PLATELET
Absolute Monocytes: 585 cells/uL (ref 200–950)
Basophils Absolute: 41 cells/uL (ref 0–200)
Basophils Relative: 0.6 %
Eosinophils Absolute: 231 cells/uL (ref 15–500)
Eosinophils Relative: 3.4 %
HCT: 42.4 % (ref 35.0–45.0)
Hemoglobin: 14 g/dL (ref 11.7–15.5)
Lymphs Abs: 2638 cells/uL (ref 850–3900)
MCH: 32 pg (ref 27.0–33.0)
MCHC: 33 g/dL (ref 32.0–36.0)
MCV: 97 fL (ref 80.0–100.0)
MPV: 11 fL (ref 7.5–12.5)
Monocytes Relative: 8.6 %
Neutro Abs: 3305 cells/uL (ref 1500–7800)
Neutrophils Relative %: 48.6 %
Platelets: 197 10*3/uL (ref 140–400)
RBC: 4.37 10*6/uL (ref 3.80–5.10)
RDW: 11.1 % (ref 11.0–15.0)
Total Lymphocyte: 38.8 %
WBC: 6.8 10*3/uL (ref 3.8–10.8)

## 2022-02-01 LAB — LIPID PANEL
Cholesterol: 126 mg/dL (ref <200)
HDL: 46 mg/dL — ABNORMAL LOW (ref >=50)
LDL Cholesterol (Calc): 56 mg/dL (calc)
Non-HDL Cholesterol (Calc): 80 mg/dL (calc) (ref <130)
Total CHOL/HDL Ratio: 2.7 (calc) (ref <5.0)
Triglycerides: 161 mg/dL — ABNORMAL HIGH (ref <150)

## 2022-02-01 LAB — HEMOGLOBIN A1C
Hgb A1c MFr Bld: 5.8 % of total Hgb — ABNORMAL HIGH (ref <5.7)
Mean Plasma Glucose: 120 mg/dL
eAG (mmol/L): 6.6 mmol/L

## 2022-02-01 LAB — TSH: TSH: 4.39 mIU/L (ref 0.40–4.50)

## 2022-02-03 NOTE — Telephone Encounter (Signed)
Reduce Lantus to 25 units subcu daily.  Monitor blood sugars closely notify provider if less than 75 or greater than 200. We will continue to reduce if blood sugars are stable Continue to cut down on carbohydrates, starchy foods sugary foods or drinks.  And exercise at least 3 times per week for 30 minutes.

## 2022-02-24 DIAGNOSIS — E1169 Type 2 diabetes mellitus with other specified complication: Secondary | ICD-10-CM | POA: Diagnosis not present

## 2022-03-02 DIAGNOSIS — Q111 Other anophthalmos: Secondary | ICD-10-CM | POA: Diagnosis not present

## 2022-03-08 ENCOUNTER — Ambulatory Visit (INDEPENDENT_AMBULATORY_CARE_PROVIDER_SITE_OTHER): Payer: Medicare Other | Admitting: Podiatry

## 2022-03-08 ENCOUNTER — Encounter: Payer: Self-pay | Admitting: Podiatry

## 2022-03-08 DIAGNOSIS — E1165 Type 2 diabetes mellitus with hyperglycemia: Secondary | ICD-10-CM | POA: Diagnosis not present

## 2022-03-08 DIAGNOSIS — M79674 Pain in right toe(s): Secondary | ICD-10-CM

## 2022-03-08 DIAGNOSIS — M79675 Pain in left toe(s): Secondary | ICD-10-CM

## 2022-03-08 DIAGNOSIS — B351 Tinea unguium: Secondary | ICD-10-CM

## 2022-03-08 NOTE — Telephone Encounter (Signed)
Please schedule an in office appointment to evaluate swelling on your legs.

## 2022-03-15 NOTE — Progress Notes (Signed)
  Subjective:  Patient ID: Wendy Obrien, female    DOB: 04-22-1951,  MRN: [id number]  Wendy Obrien presents to clinic today for preventative diabetic foot care and ingrown toenail to the both borders of left hallux and lateral border right hallux  Patient states blood glucose was 121 mg/dl  last night .  Last A1c was 5.7%.  She is s/p matrixectomy lateral border right great toe.  PCP is Ngetich, Nelda Bucks, NP , and last visit was Jan 31, 2022.  Allergies  Allergen Reactions   Bee Venom Anaphylaxis   Beeswax Anaphylaxis    bees   Fish Allergy Anaphylaxis   Shellfish Allergy Anaphylaxis    And all seafood   Aspirin Other (See Comments)    G6P Deficiency   Eggs Or Egg-Derived Products Hives   Nsaids Other (See Comments)    G6P Deficiency   Sulfa Antibiotics Other (See Comments)    G6P Deficiency     Review of Systems: Negative except as noted in the HPI.  Objective: No changes noted in today's physical examination. Wendy Obrien is a pleasant 71 y.o. female in NAD. AAO X 3.  Vascular Examination: CFT <3 seconds b/l LE. Palpable DP pulse(s) b/l LE. Palpable PT pulse(s) b/l LE. Pedal hair sparse. No pain with calf compression b/l. Lower extremity skin temperature gradient within normal limits. No edema noted b/l LE. No ischemia or gangrene noted b/l LE. No cyanosis or clubbing noted b/l LE.  Dermatological Examination: No open wounds b/l LE. No interdigital macerations noted b/l LE. Incurvated nailplate medial border left hallux, lateral border left hallux, and medial border right hallux.  Nail border hypertrophy absent. There is tenderness to palpation. Sign(s) of infection: no clinical signs of infection noted on examination today. No hyperkeratotic nor porokeratotic lesions present on today's visit.  Toenails 1-5 bilaterally with white, chalky residue overlying all toenails consistent with superficial white onychomycosis.   Neurological Examination: Protective  sensation intact 5/5 intact bilaterally with 10g monofilament b/l. Vibratory sensation intact b/l.  Musculoskeletal Examination: Muscle strength 5/5 to all lower extremity muscle groups bilaterally. Pes planus deformity noted bilateral LE. Patient ambulates independent of any assistive aids.     Latest Ref Rng & Units 01/31/2022    9:18 AM 10/05/2021   10:02 AM 03/20/2021   12:20 AM  Hemoglobin A1C  Hemoglobin-A1c <5.7 % of total Hgb 5.8  10.7  5.9    Assessment/Plan: 1. Pain due to onychomycosis of toenails of both feet   2. Type 2 diabetes mellitus with hyperglycemia, without long-term current use of insulin (HCC)      -Examined patient. -For SWO, patient instructed to apply Vick's Vapor Rub to nailplates with cotton swab once daily. -Toenails 1-5 b/l were debrided in length and girth with sterile nail nippers and dremel without iatrogenic bleeding.  -Offending nail border debrided and curretaged both borders of left hallux and lateral border right hallux utilizing sterile nail nipper and currette. Border(s) cleansed with alcohol and TAO applied. Patient/POA/Caregiver/Facility instructed to apply triple antibiotic ointment  to bilateral great toes once daily for 7 days. Call office if there are any concerns. -Patient/POA to call should there be question/concern in the interim.   Return in about 3 months (around 06/08/2022).  Marzetta Board, DPM

## 2022-03-26 DIAGNOSIS — E1169 Type 2 diabetes mellitus with other specified complication: Secondary | ICD-10-CM | POA: Diagnosis not present

## 2022-04-03 DIAGNOSIS — M654 Radial styloid tenosynovitis [de Quervain]: Secondary | ICD-10-CM | POA: Diagnosis not present

## 2022-04-03 DIAGNOSIS — R2232 Localized swelling, mass and lump, left upper limb: Secondary | ICD-10-CM | POA: Diagnosis not present

## 2022-04-04 DIAGNOSIS — H25811 Combined forms of age-related cataract, right eye: Secondary | ICD-10-CM | POA: Diagnosis not present

## 2022-04-04 DIAGNOSIS — E119 Type 2 diabetes mellitus without complications: Secondary | ICD-10-CM | POA: Diagnosis not present

## 2022-04-04 DIAGNOSIS — H40021 Open angle with borderline findings, high risk, right eye: Secondary | ICD-10-CM | POA: Diagnosis not present

## 2022-04-17 DIAGNOSIS — Z882 Allergy status to sulfonamides status: Secondary | ICD-10-CM | POA: Diagnosis not present

## 2022-04-17 DIAGNOSIS — Z886 Allergy status to analgesic agent status: Secondary | ICD-10-CM | POA: Diagnosis not present

## 2022-04-17 DIAGNOSIS — Z91030 Bee allergy status: Secondary | ICD-10-CM | POA: Diagnosis not present

## 2022-04-17 DIAGNOSIS — I509 Heart failure, unspecified: Secondary | ICD-10-CM | POA: Diagnosis not present

## 2022-04-17 DIAGNOSIS — Z794 Long term (current) use of insulin: Secondary | ICD-10-CM | POA: Diagnosis not present

## 2022-04-17 DIAGNOSIS — E119 Type 2 diabetes mellitus without complications: Secondary | ICD-10-CM | POA: Diagnosis not present

## 2022-04-17 DIAGNOSIS — I110 Hypertensive heart disease with heart failure: Secondary | ICD-10-CM | POA: Diagnosis not present

## 2022-04-17 DIAGNOSIS — Z79899 Other long term (current) drug therapy: Secondary | ICD-10-CM | POA: Diagnosis not present

## 2022-04-17 DIAGNOSIS — H05332 Deformity of left orbit due to trauma or surgery: Secondary | ICD-10-CM | POA: Diagnosis not present

## 2022-04-17 DIAGNOSIS — Q111 Other anophthalmos: Secondary | ICD-10-CM | POA: Diagnosis not present

## 2022-04-17 DIAGNOSIS — H16212 Exposure keratoconjunctivitis, left eye: Secondary | ICD-10-CM | POA: Diagnosis not present

## 2022-04-26 ENCOUNTER — Encounter (HOSPITAL_BASED_OUTPATIENT_CLINIC_OR_DEPARTMENT_OTHER): Payer: Self-pay | Admitting: Orthopedic Surgery

## 2022-04-26 DIAGNOSIS — E1169 Type 2 diabetes mellitus with other specified complication: Secondary | ICD-10-CM | POA: Diagnosis not present
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
## 2022-05-08 NOTE — Anesthesia Preprocedure Evaluation (Signed)
Anesthesia Evaluation  Patient identified by MRN, date of birth, ID band Patient awake    Reviewed: Allergy & Precautions, NPO status , Patient's Chart, lab work & pertinent test results  Airway Mallampati: II  TM Distance: >3 FB Neck ROM: Full    Dental  (+) Dental Advisory Given, Teeth Intact   Pulmonary neg pulmonary ROS, former smoker,    Pulmonary exam normal breath sounds clear to auscultation       Cardiovascular hypertension, +CHF  Normal cardiovascular exam+ Valvular Problems/Murmurs MR  Rhythm:Regular Rate:Normal  Echo  1. Left ventricular ejection fraction, by estimation, is 60 to 65%. The left ventricle has normal function. The left ventricle has no  regional wall motion abnormalities. Left ventricular diastolic parameters are consistent with Grade I diastolic dysfunction (impaired relaxation).  2. Right ventricular systolic function is normal. The right ventricular size is normal. There is normal pulmonary artery systolic pressure.  3. The mitral valve is normal in structure. Mild mitral valve  regurgitation. No evidence of mitral stenosis.  4. The aortic valve is normal in structure. Aortic valve regurgitation is trivial. No aortic stenosis is present.  5. The inferior vena cava is normal in size with greater than 50% respiratory variability, suggesting right atrial pressure of 3 mmHg.    Neuro/Psych negative neurological ROS     GI/Hepatic negative GI ROS, Neg liver ROS,   Endo/Other  diabetesMorbid obesity  Renal/GU negative Renal ROS     Musculoskeletal  (+) Arthritis ,   Abdominal (+) + obese,   Peds  Hematology negative hematology ROS (+)   Anesthesia Other Findings   Reproductive/Obstetrics                            Anesthesia Physical Anesthesia Plan  ASA: 2  Anesthesia Plan: MAC and Regional   Post-op Pain Management: Tylenol PO (pre-op)*   Induction:  Intravenous  PONV Risk Score and Plan: Ondansetron and Dexamethasone  Airway Management Planned:   Additional Equipment:   Intra-op Plan:   Post-operative Plan:   Informed Consent: I have reviewed the patients History and Physical, chart, labs and discussed the procedure including the risks, benefits and alternatives for the proposed anesthesia with the patient or authorized representative who has indicated his/her understanding and acceptance.     Dental advisory given  Plan Discussed with: CRNA  Anesthesia Plan Comments:        Anesthesia Quick Evaluation

## 2022-05-09 ENCOUNTER — Other Ambulatory Visit: Payer: Self-pay

## 2022-05-09 ENCOUNTER — Ambulatory Visit (HOSPITAL_BASED_OUTPATIENT_CLINIC_OR_DEPARTMENT_OTHER)
Admission: RE | Admit: 2022-05-09 | Discharge: 2022-05-09 | Disposition: A | Discharge status: Home / Self Care | Payer: Medicare Other | Attending: Orthopedic Surgery | Admitting: Orthopedic Surgery

## 2022-05-09 ENCOUNTER — Ambulatory Visit (HOSPITAL_BASED_OUTPATIENT_CLINIC_OR_DEPARTMENT_OTHER): Payer: Medicare Other | Admitting: Anesthesiology

## 2022-05-09 ENCOUNTER — Encounter (HOSPITAL_BASED_OUTPATIENT_CLINIC_OR_DEPARTMENT_OTHER)
Admission: RE | Disposition: A | Discharge status: Home / Self Care | Payer: Self-pay | Source: Home / Self Care | Attending: Orthopedic Surgery

## 2022-05-09 ENCOUNTER — Encounter (HOSPITAL_BASED_OUTPATIENT_CLINIC_OR_DEPARTMENT_OTHER): Payer: Self-pay | Admitting: Orthopedic Surgery

## 2022-05-09 ENCOUNTER — Other Ambulatory Visit (HOSPITAL_COMMUNITY): Payer: Self-pay

## 2022-05-09 DIAGNOSIS — M654 Radial styloid tenosynovitis [de Quervain]: Secondary | ICD-10-CM | POA: Insufficient documentation

## 2022-05-09 DIAGNOSIS — E119 Type 2 diabetes mellitus without complications: Secondary | ICD-10-CM | POA: Insufficient documentation

## 2022-05-09 DIAGNOSIS — R2232 Localized swelling, mass and lump, left upper limb: Secondary | ICD-10-CM | POA: Diagnosis not present

## 2022-05-09 DIAGNOSIS — I110 Hypertensive heart disease with heart failure: Secondary | ICD-10-CM | POA: Diagnosis not present

## 2022-05-09 DIAGNOSIS — Z87891 Personal history of nicotine dependence: Secondary | ICD-10-CM | POA: Insufficient documentation

## 2022-05-09 DIAGNOSIS — I509 Heart failure, unspecified: Secondary | ICD-10-CM | POA: Insufficient documentation

## 2022-05-09 DIAGNOSIS — D1722 Benign lipomatous neoplasm of skin and subcutaneous tissue of left arm: Secondary | ICD-10-CM | POA: Diagnosis not present

## 2022-05-09 DIAGNOSIS — I10 Essential (primary) hypertension: Secondary | ICD-10-CM

## 2022-05-09 HISTORY — PX: MINOR RELEASE DORSAL COMPARTMENT (DEQUERVAINS): SHX5980

## 2022-05-09 HISTORY — DX: Heart failure, unspecified: I50.9

## 2022-05-09 HISTORY — DX: Unspecified osteoarthritis, unspecified site: M19.90

## 2022-05-09 LAB — POCT I-STAT, CHEM 8
BUN: 12 mg/dL (ref 8–23)
Calcium, Ion: 1.27 mmol/L (ref 1.15–1.40)
Chloride: 103 mmol/L (ref 98–111)
Creatinine, Ser: 0.7 mg/dL (ref 0.44–1.00)
Glucose, Bld: 139 mg/dL — ABNORMAL HIGH (ref 70–99)
HCT: 40 % (ref 36.0–46.0)
Hemoglobin: 13.6 g/dL (ref 12.0–15.0)
Potassium: 3.9 mmol/L (ref 3.5–5.1)
Sodium: 144 mmol/L (ref 135–145)
TCO2: 26 mmol/L (ref 22–32)

## 2022-05-09 LAB — GLUCOSE, CAPILLARY: Glucose-Capillary: 143 mg/dL — ABNORMAL HIGH (ref 70–99)

## 2022-05-09 SURGERY — MINOR RELEASE DORSAL COMPARTMENT (DEQUERVAINS)
Anesthesia: Monitor Anesthesia Care | Site: Arm Lower | Laterality: Left

## 2022-05-09 MED ORDER — ONDANSETRON HCL 4 MG/2ML IJ SOLN
INTRAMUSCULAR | Status: DC | PRN
  Administered 2022-05-09: 4 mg via INTRAVENOUS

## 2022-05-09 MED ORDER — EPHEDRINE SULFATE (PRESSORS) 50 MG/ML IJ SOLN
INTRAMUSCULAR | Status: DC | PRN
  Administered 2022-05-09 (×2): 10 mg via INTRAVENOUS

## 2022-05-09 MED ORDER — HYDROCODONE-ACETAMINOPHEN 5-325 MG PO TABS
1.0000 | ORAL_TABLET | Freq: Four times a day (QID) | ORAL | 0 refills | Status: AC | PRN
  Filled 2022-05-09: qty 10, 3d supply, fill #0

## 2022-05-09 MED ORDER — FENTANYL CITRATE (PF) 100 MCG/2ML IJ SOLN
INTRAMUSCULAR | Status: AC
  Filled 2022-05-09: qty 2

## 2022-05-09 MED ORDER — 0.9 % SODIUM CHLORIDE (POUR BTL) OPTIME
TOPICAL | Status: DC | PRN
  Administered 2022-05-09: 500 mL

## 2022-05-09 MED ORDER — LACTATED RINGERS IV SOLN: INTRAVENOUS | Status: DC

## 2022-05-09 MED ORDER — ROPIVACAINE HCL 7.5 MG/ML IJ SOLN
INTRAMUSCULAR | Status: DC | PRN
  Administered 2022-05-09: 10 mL via PERINEURAL

## 2022-05-09 MED ORDER — CEFAZOLIN SODIUM-DEXTROSE 2-4 GM/100ML-% IV SOLN
2.0000 g | INTRAVENOUS | Status: AC
  Administered 2022-05-09: 2 g via INTRAVENOUS

## 2022-05-09 MED ORDER — ACETAMINOPHEN 500 MG PO TABS
1000.0000 mg | ORAL_TABLET | Freq: Once | ORAL | Status: AC
  Administered 2022-05-09: 1000 mg via ORAL

## 2022-05-09 MED ORDER — ROPIVACAINE HCL 5 MG/ML IJ SOLN
INTRAMUSCULAR | Status: DC | PRN
  Administered 2022-05-09: 25 mL via PERINEURAL

## 2022-05-09 MED ORDER — PROPOFOL 10 MG/ML IV BOLUS
INTRAVENOUS | Status: DC | PRN
  Administered 2022-05-09: 10 mg via INTRAVENOUS

## 2022-05-09 MED ORDER — FENTANYL CITRATE (PF) 100 MCG/2ML IJ SOLN
50.0000 ug | Freq: Once | INTRAMUSCULAR | Status: AC
  Administered 2022-05-09: 50 ug via INTRAVENOUS

## 2022-05-09 MED ORDER — DEXAMETHASONE SODIUM PHOSPHATE 4 MG/ML IJ SOLN
INTRAMUSCULAR | Status: DC | PRN
  Administered 2022-05-09: 4 mg via PERINEURAL

## 2022-05-09 MED ORDER — PROPOFOL 500 MG/50ML IV EMUL
INTRAVENOUS | Status: DC | PRN
  Administered 2022-05-09: 75 ug/kg/min via INTRAVENOUS

## 2022-05-09 MED ORDER — ACETAMINOPHEN 500 MG PO TABS
ORAL_TABLET | ORAL | Status: AC
  Filled 2022-05-09: qty 2

## 2022-05-09 MED ORDER — AMISULPRIDE (ANTIEMETIC) 5 MG/2ML IV SOLN: 10.0000 mg | Freq: Once | INTRAVENOUS | Status: DC | PRN

## 2022-05-09 MED ORDER — CEFAZOLIN SODIUM-DEXTROSE 2-4 GM/100ML-% IV SOLN
INTRAVENOUS | Status: AC
  Filled 2022-05-09: qty 100

## 2022-05-09 MED ORDER — FENTANYL CITRATE (PF) 100 MCG/2ML IJ SOLN: 25.0000 ug | INTRAMUSCULAR | Status: DC | PRN

## 2022-05-09 SURGICAL SUPPLY — 54 items
BANDAGE ACE 4X5 VEL STRL LF (GAUZE/BANDAGES/DRESSINGS) IMPLANT
BLADE SURG 15 STRL LF DISP TIS (BLADE) ×1 IMPLANT
BLADE SURG 15 STRL SS (BLADE) ×1
BNDG CMPR 5X4 CHSV STRCH STRL (GAUZE/BANDAGES/DRESSINGS)
BNDG CMPR 9X4 STRL LF SNTH (GAUZE/BANDAGES/DRESSINGS) ×1
BNDG COHESIVE 4X5 TAN STRL LF (GAUZE/BANDAGES/DRESSINGS) ×1 IMPLANT
BNDG ELASTIC 4X5.8 VLCR STR LF (GAUZE/BANDAGES/DRESSINGS) ×1 IMPLANT
BNDG ESMARK 4X9 LF (GAUZE/BANDAGES/DRESSINGS) ×1 IMPLANT
CORD BIPOLAR FORCEPS 12FT (ELECTRODE) ×1 IMPLANT
COVER BACK TABLE 60X90IN (DRAPES) IMPLANT
CUFF TOURN SGL QUICK 18X4 (TOURNIQUET CUFF) IMPLANT
DECANTER SPIKE VIAL GLASS SM (MISCELLANEOUS) IMPLANT
DRAPE EXTREMITY T 121X128X90 (DISPOSABLE) ×1 IMPLANT
DRAPE IMP U-DRAPE 54X76 (DRAPES) ×2 IMPLANT
DRAPE SURG 17X23 STRL (DRAPES) ×1 IMPLANT
DRSG EMULSION OIL 3X3 NADH (GAUZE/BANDAGES/DRESSINGS) ×1 IMPLANT
ELECT REM PT RETURN 9FT ADLT (ELECTROSURGICAL)
ELECTRODE REM PT RTRN 9FT ADLT (ELECTROSURGICAL) ×1 IMPLANT
GAUZE SPONGE 4X4 12PLY STRL (GAUZE/BANDAGES/DRESSINGS) ×1 IMPLANT
GAUZE SPONGE 4X4 12PLY STRL LF (GAUZE/BANDAGES/DRESSINGS) IMPLANT
GLOVE BIOGEL PI IND STRL 7.5 (GLOVE) ×2 IMPLANT
GLOVE BIOGEL PI INDICATOR 7.5 (GLOVE) ×2
GOWN STRL REUS W/ TWL LRG LVL3 (GOWN DISPOSABLE) ×1 IMPLANT
GOWN STRL REUS W/TWL LRG LVL3 (GOWN DISPOSABLE) ×1
HIBICLENS CHG 4% 4OZ BTL (MISCELLANEOUS) ×1 IMPLANT
NEEDLE HYPO 22GX1.5 SAFETY (NEEDLE) IMPLANT
NS IRRIG 1000ML POUR BTL (IV SOLUTION) ×1 IMPLANT
NS IRRIG 500ML POUR BTL (IV SOLUTION) IMPLANT
PACK BASIN DAY SURGERY FS (CUSTOM PROCEDURE TRAY) ×1 IMPLANT
PAD CAST 4YDX4 CTTN HI CHSV (CAST SUPPLIES) ×1 IMPLANT
PADDING CAST COTTON 4X4 STRL (CAST SUPPLIES) ×1
PADDING CAST COTTON 6X4 NS (MISCELLANEOUS) IMPLANT
PENCIL SMOKE EVACUATOR (MISCELLANEOUS) ×1 IMPLANT
SLEEVE SCD COMPRESS KNEE MED (STOCKING) IMPLANT
SLING ARM FOAM STRAP LRG (SOFTGOODS) ×1 IMPLANT
SPLINT FAST PLASTER 5X30 (CAST SUPPLIES)
SPLINT PLASTER CAST FAST 4X15 (CAST SUPPLIES) IMPLANT
SPLINT PLASTER CAST FAST 5X30 (CAST SUPPLIES) ×1 IMPLANT
SPLINT PLASTER CAST XFAST 3X15 (CAST SUPPLIES) ×10 IMPLANT
SPLINT PLASTER FASTSET 4 (CAST SUPPLIES) ×1
SPLINT PLASTER XTRA FASTSET 3X (CAST SUPPLIES)
STOCKINETTE 4X48 STRL (DRAPES) ×1 IMPLANT
STRIP CLOSURE SKIN 1/2X4 (GAUZE/BANDAGES/DRESSINGS) IMPLANT
SUCTION FRAZIER HANDLE 10FR (MISCELLANEOUS) ×1
SUCTION TUBE FRAZIER 10FR DISP (MISCELLANEOUS) ×1 IMPLANT
SUT ETHILON 4 0 PS 2 18 (SUTURE) IMPLANT
SUT MNCRL AB 3-0 PS2 18 (SUTURE) IMPLANT
SUT MNCRL AB 4-0 PS2 18 (SUTURE) IMPLANT
SUT PROLENE 3 0 PS 2 (SUTURE) IMPLANT
SUT VIC AB 4-0 PS2 27 (SUTURE) IMPLANT
SYR BULB EAR ULCER 3OZ GRN STR (SYRINGE) ×1 IMPLANT
TOWEL OR 17X26 10 PK STRL BLUE (TOWEL DISPOSABLE) ×1 IMPLANT
TUBE CONNECTING 12X1/4 (SUCTIONS) ×1 IMPLANT
YANKAUER SUCT BULB TIP NO VENT (SUCTIONS) IMPLANT

## 2022-05-09 NOTE — Discharge Instructions (Addendum)
Orthopaedic Hand Surgery Discharge Instructions  WEIGHT BEARING STATUS: Non weight bearing on operative extremity  DRESSING CARE: Please keep your dressing/splint/cast clean and dry until your follow-up appointment. You may shower by placing a waterproof covering over your dressing/splint/cast. Contact your surgeon if your splint/cast gets wet. It will need to be changed to prevent skin breakdown.  PAIN CONTROL: First line medications for post operative pain control are Tylenol (acetaminophen) and Motrin (ibuprofen) if you are able to take these medications. If you have been prescribed a medication these can be taken as breakthrough pain medications. Please note that some narcotic pain medication has acetaminophen added and you should never consume more than 4,000mg of acetaminophen in 24-hour period. Please note that if you are given Toradol (ketorolac) you should not take similar medications such as ibuprofen or naproxen.  DISCHARGE MEDICATIONS: If you have been prescribed medication it was sent electronically to your pharmacy. No changes have been made to your home medications.  ICE/ELEVATION: Ice and elevate your injured extremity as needed. Avoid direct contact of ice with skin.   BANDAGE FEELS TOO TIGHT: If your bandage feels too tight, first make sure you are elevating your fingers as much as possible. The outer layer of the bandage can be unwrapped and reapplied more loosely. If no improvement, you may carefully cut the inner layer longitudinally until the pressure has resolved and then rewrap the outer layer. If you are not comfortable with these instructions, please call the office and the bandage can be changed for you.   FOLLOW UP: You will be called after surgery with an appointment date and time, however if you have not received a phone call within 3 days, please call during regular office hours at [phone] to schedule a post operative appointment.  Please Seek Medical Attention  if: Call MD for: pain or pressure in chest, jaw, arm, back, neck  Call MD for: temperature greater than 101 F for more than 24 hrs Call MD for: difficulty breathing Call MD for: incision redness, bleeding, drainage  Call MD for: palpitations or feeling that the heart is racing  Call MD for: increased swelling in arm, leg, ankle, or abdomen  Call MD for: lightheadedness, dizziness, fainting Call 911 or go to ER for any medical emergency if you are not able to get in touch with your doctor   J. Reid Spears, MD Orthopaedic Hand Surgeon [facility] Office number: [phone] [address] [place], [place] [address]   Post Anesthesia Home Care Instructions  Activity: Get plenty of rest for the remainder of the day. A responsible individual must stay with you for 24 hours following the procedure.  For the next 24 hours, DO NOT: -Drive a car -Operate machinery -Drink alcoholic beverages -Take any medication unless instructed by your physician -Make any legal decisions or sign important papers.  Meals: Start with liquid foods such as gelatin or soup. Progress to regular foods as tolerated. Avoid greasy, spicy, heavy foods. If nausea and/or vomiting occur, drink only clear liquids until the nausea and/or vomiting subsides. Call your physician if vomiting continues.  Special Instructions/Symptoms: Your throat may feel dry or sore from the anesthesia or the breathing tube placed in your throat during surgery. If this causes discomfort, gargle with warm salt water. The discomfort should disappear within 24 hours.  If you had a scopolamine patch placed behind your ear for the management of post- operative nausea and/or vomiting:  1. The medication in the patch is effective for 72   hours, after which it should be removed.  Wrap patch in a tissue and discard in the trash. Wash hands thoroughly with soap and water. 2. You may remove the patch earlier than 72 hours if you experience  unpleasant side effects which may include dry mouth, dizziness or visual disturbances. 3. Avoid touching the patch. Wash your hands with soap and water after contact with the patch.     

## 2022-05-09 NOTE — Progress Notes (Signed)
Assisted Dr. Germeroth with left, axillary, ultrasound guided block. Side rails up, monitors on throughout procedure. See vital signs in flow sheet. Tolerated Procedure well. 

## 2022-05-09 NOTE — Op Note (Signed)
OPERATIVE NOTE  DATE OF PROCEDURE: 05/09/2022  SURGEONS:  Primary: Orene Desanctis, MD  PREOPERATIVE DIAGNOSIS: Left dequervains tenosynovitis, left dorsal forearm mass  POSTOPERATIVE DIAGNOSIS: Same  NAME OF PROCEDURE:   Left DeQuervains First Dorsal Compartment Release Left dorsal forearm mass excision  ANESTHESIA: MAC + Local  SKIN PREPARATION: Hibiclens  ESTIMATED BLOOD LOSS: Minimal  IMPLANTS: None  INDICATIONS:  Wendy Obrien is a 71 y.o. female who has the above preoperative diagnosis. The patient has decided to proceed with surgical intervention.  Risks, benefits and alternatives of operative management were discussed including, but not limited to, risks of anesthesia complications, infection, pain, persistent symptoms, stiffness, need for future surgery.  The patient understands, agrees and elects to proceed with surgery.    DESCRIPTION OF PROCEDURE: The patient was met in the pre-operative area and their identity was verified.  The operative location and laterality was also verified and marked.  The patient was brought to the OR and was placed supine on the table.  After repeat patient identification with the operative team anesthesia was provided and the patient was prepped and draped in the usual sterile fashion.  A final timeout was performed verifying the correction patient, procedure, location and laterality.  The left upper extremity was elevated and exsanguinated with an Esmarch. The tourniquet was inflated to 250mHg. An oblique incision incision was made over the first dorsal compartment of the wrist. Skin and subcutaneous tissue was divided and care was taken to protect the radial sensory nerve. The first dorsal compartment tendon sheath was identified and clear of all superficial tissue and nerve branches and veins were protected. The sheath was divided and the most dorsal aspect in line with the APL and EPB tendons. Tenosynovium was removed. The APL tendon with multiple slips  was freed from proximal to distal. The EPB tendon had a separate subsheath that was also incised and completely released. The wound was irrigated with normal saline and the skin was closed with 4-0 nylon sutures.   A longitudinal incision was made in the central aspect of the distal third of the dorsal forearm.  Skin and subcutaneous tissues were divided and careful hemostasis was obtained the adipose tissue was identified and there was a circumscribed mass of adipose in this area.  The plane and subcutaneous tissue down to level of fascia was identified care was taken to protect dorsal veins and sensory nerve branches.  The mass was excised en bloc and sent to pathology as a specimen.  The wound was thoroughly irrigated hemostasis was obtained.  The margins of the wound bed were identified and no further mass was present there was normal adipose tissue at this level proximally and distally.  Skin was then closed with 4-0 nylon in standard fashion.   A sterile soft bandage and short arm plaster splint was applied and tourniquet deflated. The fingers were pink and warm and well perfused at the end of the procedure. All counts were correct x 2. The patient tolerated the procedure well and was brought to PACU for recovery in stable condition.    JMatt Holmes MD

## 2022-05-09 NOTE — Anesthesia Postprocedure Evaluation (Signed)
Anesthesia Post Note  Patient: Wendy Obrien  Procedure(s) Performed: Left dequervains release and left dorsal forearm mass excision (Left: Arm Lower)     Patient location during evaluation: PACU Anesthesia Type: Regional Level of consciousness: awake and alert Pain management: pain level controlled Vital Signs Assessment: post-procedure vital signs reviewed and stable Respiratory status: spontaneous breathing Cardiovascular status: stable Anesthetic complications: no   No notable events documented.  Last Vitals:  Vitals:   05/09/22 0900 05/09/22 0930  BP: (!) 148/78 (!) 159/78  Pulse: (!) 47 (!) 50  Resp: 18 19  Temp:  (!) 36.3 C  SpO2: 95% 92%    Last Pain:  Vitals:   05/09/22 0930  TempSrc:   PainSc: 0-No pain                 Nolon Nations

## 2022-05-09 NOTE — Transfer of Care (Signed)
Immediate Anesthesia Transfer of Care Note  Patient: Wendy Obrien  Procedure(s) Performed: Left dequervains release and left dorsal forearm mass excision (Left: Arm Lower)  Patient Location: PACU  Anesthesia Type:MAC  Level of Consciousness: awake, alert , oriented and patient cooperative  Airway & Oxygen Therapy: Patient Spontanous Breathing  Post-op Assessment: Report given to RN and Post -op Vital signs reviewed and stable  Post vital signs: Reviewed and stable  Last Vitals:  Vitals Value Taken Time  BP 127/74 05/09/22 0836  Temp    Pulse 50 05/09/22 0837  Resp 18 05/09/22 0837  SpO2 96 % 05/09/22 0837  Vitals shown include unvalidated device data.  Last Pain:  Vitals:   05/09/22 0613  TempSrc: Oral  PainSc: 7       Patients Stated Pain Goal: 4 (20/72/18 2883)  Complications: No notable events documented.

## 2022-05-09 NOTE — H&P (Signed)
Preoperative History & Physical Exam  Surgeon: Matt Holmes, MD  Diagnosis: Left dequervains tenosynovitis, left dorsal forearm mass  Planned Procedure: Procedure(s) (LRB): Left dequervains release and left dorsal forearm mass excision (Left)  History of Present Illness:   Patient is a 71 y.o. female with symptoms consistent with Left dequervains tenosynovitis, left dorsal forearm mass who presents for surgical intervention. The risks, benefits and alternatives of surgical intervention were discussed and informed consent was obtained prior to surgery.  Past Medical History:  Past Medical History:  Diagnosis Date   Arthritis    CHF (congestive heart failure) (HCC)    DM (diabetes mellitus) ([place])    Essential tremor    head   G6PD deficiency    Glaucoma    Per new patient form   High cholesterol    Per new patient form   HTN (hypertension)    Hyperlipidemia     Past Surgical History:  Past Surgical History:  Procedure Laterality Date   ABDOMINAL HYSTERECTOMY     Dr. Owens Shark / Per new patient form   ANKLE SURGERY Right    x 2   BREAST LUMPECTOMY Left    benign   DG  BONE DENSITY ([place] HX)     Per new patient form   DIAGNOSTIC MAMMOGRAM     Per new patient form   EYE SURGERY Left    age 40 - L eye removed, Dr. Drenda Freeze / Per new patient form   REDUCTION MAMMAPLASTY Bilateral    1998    Medications:  Prior to Admission medications   Medication Sig Start Date End Date Taking? Authorizing Provider  acetaminophen (TYLENOL) 500 MG tablet Take 500-1,000 mg by mouth daily as needed for headache (pain).   Yes [provider]  atorvastatin (LIPITOR) 40 MG tablet Take 40 mg by mouth daily with breakfast. 06/11/20  Yes [provider]  B-D ULTRAFINE III SHORT PEN 31G X 8 MM MISC SMARTSIG:injection Twice Daily 11/14/21  Yes [provider]  BETA CAROTENE PO Take 1 tablet by mouth daily with breakfast.   Yes [provider]  BIOTIN PO Take by  mouth daily.   Yes [provider]  busPIRone (BUSPAR) 7.5 MG tablet Take 7.5 mg by mouth daily with breakfast. 06/11/20  Yes [provider]  hydrochlorothiazide (HYDRODIURIL) 25 MG tablet Take 25 mg by mouth daily with breakfast. 12/27/20  Yes [provider]  ibuprofen (ADVIL) 600 MG tablet Take 600 mg by mouth 3 (three) times daily as needed. 11/15/21  Yes [provider]  insulin glargine (LANTUS) 100 unit/mL SOPN Inject 27 Units into the skin in the morning and at bedtime. AM &PM   Yes [provider]  latanoprost (XALATAN) 0.005 % ophthalmic solution Place 1 drop into the right eye at bedtime.   Yes [provider]  losartan (COZAAR) 100 MG tablet Take 100 mg by mouth daily with breakfast.   Yes [provider]  Misc Natural Products (NEURIVA) CAPS Take 1 capsule by mouth daily with breakfast.   Yes [provider]  Multiple Vitamin (MULTIVITAMIN WITH MINERALS) TABS tablet Take 1 tablet by mouth daily with breakfast.   Yes [provider]  Multiple Vitamins-Minerals (HAIR/SKIN/NAILS/BIOTIN) TABS Take 1 tablet by mouth daily with breakfast.   Yes [provider]  Omega-3 Fatty Acids (FISH OIL) 1200 MG CAPS Take 1,200 mg by mouth daily with breakfast.   Yes [provider]  propranolol ER (INDERAL LA) 120 MG  24 hr capsule Take 1 capsule (120 mg total) by mouth at bedtime. 01/31/22  Yes Ngetich, Dinah C, NP  SODIUM FLUORIDE, DENTAL RINSE, 0.2 % SOLN Place 1 application onto teeth daily as needed (to strenghten teeth). 07/13/20  Yes [provider]  timolol (TIMOPTIC) 0.5 % ophthalmic solution Place 1 drop into the right eye every morning. 02/25/21  Yes [provider]  traZODone (DESYREL) 50 MG tablet Take 50 mg by mouth at bedtime as needed for sleep.   Yes [provider]    Allergies:  Bee venom, Beeswax, Fish allergy, Shellfish allergy, Aspirin, Eggs or egg-derived products,  Nsaids, and Sulfa antibiotics  Review of Systems: Negative except per HPI.  Physical Exam: Alert and oriented, NAD Head and neck: no masses, normal alignment CV: pulse intact Pulm: no increased work of breathing, respirations even and unlabored Abdomen: non-distended Extremities: extremities warm and well perfused  LABS: Recent Results (from the past 2160 hour(s))  I-STAT, chem 8     Status: Abnormal   Collection Time: 05/09/22  6:40 AM  Result Value Ref Range   Sodium 144 135 - 145 mmol/L   Potassium 3.9 3.5 - 5.1 mmol/L   Chloride 103 98 - 111 mmol/L   BUN 12 8 - 23 mg/dL   Creatinine, Ser 0.70 0.44 - 1.00 mg/dL   Glucose, Bld 139 (H) 70 - 99 mg/dL    Comment: Glucose reference range applies only to samples taken after fasting for at least 8 hours.   Calcium, Ion 1.27 1.15 - 1.40 mmol/L   TCO2 26 22 - 32 mmol/L   Hemoglobin 13.6 12.0 - 15.0 g/dL   HCT 40.0 36.0 - 46.0 %     Complete History and Physical exam available in the office notes  Orene Desanctis

## 2022-05-10 ENCOUNTER — Encounter (HOSPITAL_BASED_OUTPATIENT_CLINIC_OR_DEPARTMENT_OTHER): Payer: Self-pay | Admitting: Orthopedic Surgery

## 2022-05-10 LAB — SURGICAL PATHOLOGY

## 2022-05-10 NOTE — Addendum Note (Signed)
Addendum  created 05/10/22 0813 by Georgeanne Nim, CRNA   Charge Capture section accepted

## 2022-05-21 ENCOUNTER — Encounter: Payer: Self-pay | Admitting: Cardiovascular Disease

## 2022-05-21 NOTE — Progress Notes (Unsigned)
Cardiology Office Note:    Date:  05/23/2022   ID:  Wendy Obrien, DOB 11-16-1950, MRN [id number]  PCP:  Sandrea Hughs, NP   [facility] Providers Cardiologist:  Mertie Moores, MD     Referring MD: Sandrea Hughs, NP   Chief Complaint  Patient presents with   Obesity    Oct. 3, 2022   Wendy Obrien is a 71 y.o. female with a hx of HTN. She was admitted to the hospital with chest pain in July. Leane Call from March 20, 2021 showed no ischemia and was normal .  She seems to be ok now Is very active - tend to her grandchildren Gets fatigued by 3 PM in the afternoon .   HR is slow - is on inderal LA for essential tremor   October 11, 2021:  Wendy Obrien  is seen today for follow-up of her hypertension, congestive heart failure. Lexiscan Myoview study from July, 2022 was normal without any evidence of ischemia or previous infarction. Echocardiogram showed normal left ventricular systolic function with EF of 60 to 65%.  She has grade 1 diastolic dysfunction.  She had mild mitral regurgitation, trivial aortic insufficiency  She was in the hospital last week for hyperglycemia   Sept. 11, 2023  Wendy Obrien is seen for follow up of her obesity, HTN, diastolic CHF Wt is 564 lbs   Is typically very busy Now is getting fatigued around 6 PM  No clear explanation for the fatigue   Had eye surgery and had L wrist surgery        Past Medical History:  Diagnosis Date   Arthritis    CHF (congestive heart failure) (HCC)    DM (diabetes mellitus) ([place])    Essential tremor    head   G6PD deficiency    Glaucoma    Per new patient form   High cholesterol    Per new patient form   HTN (hypertension)    Hyperlipidemia     Past Surgical History:  Procedure Laterality Date   ABDOMINAL HYSTERECTOMY     Dr. Owens Shark / Per new patient form   ANKLE SURGERY Right    x 2   BREAST LUMPECTOMY Left    benign   DG  BONE DENSITY ([place] HX)     Per new patient form    DIAGNOSTIC MAMMOGRAM     Per new patient form   EYE SURGERY Left    age 27 - L eye removed, Dr. Drenda Freeze / Per new patient form   MINOR RELEASE DORSAL COMPARTMENT (DEQUERVAINS) Left 05/09/2022   Procedure: Left dequervains release and left dorsal forearm mass excision;  Surgeon: Orene Desanctis, MD;  Location: [place];  Service: Orthopedics;  Laterality: Left;  Regional block   REDUCTION MAMMAPLASTY Bilateral    1998    Current Medications: Current Meds  Medication Sig   acetaminophen (TYLENOL) 500 MG tablet Take 2 tablets by mouth every 6 (six) hours as needed.   atorvastatin (LIPITOR) 40 MG tablet Take 40 mg by mouth daily with breakfast.   B-D ULTRAFINE III SHORT PEN 31G X 8 MM MISC SMARTSIG:injection Twice Daily   BETA CAROTENE PO Take 1 tablet by mouth daily with breakfast.   BIOTIN PO Take by mouth daily.   busPIRone (BUSPAR) 7.5 MG tablet Take 7.5 mg by mouth daily with breakfast.   hydrochlorothiazide (HYDRODIURIL) 25 MG tablet Take 25 mg by mouth daily with breakfast.   insulin glargine (LANTUS)  100 unit/mL SOPN Inject 27 Units into the skin in the morning and at bedtime. AM &PM   latanoprost (XALATAN) 0.005 % ophthalmic solution Place 1 drop into the right eye at bedtime.   losartan (COZAAR) 100 MG tablet Take 100 mg by mouth daily with breakfast.   MAXITROL 3.5-10000-0.1 OINT Apply to eye. Apply to eye.   Misc Natural Products (NEURIVA) CAPS Take 1 capsule by mouth daily with breakfast.   Multiple Vitamin (MULTIVITAMIN WITH MINERALS) TABS tablet Take 1 tablet by mouth daily with breakfast.   Multiple Vitamins-Minerals (HAIR/SKIN/NAILS/BIOTIN) TABS Take 1 tablet by mouth daily with breakfast.   Omega-3 Fatty Acids (FISH OIL) 1200 MG CAPS Take 1,200 mg by mouth daily with breakfast.   propranolol ER (INDERAL LA) 120 MG 24 hr capsule Take 1 capsule (120 mg total) by mouth at bedtime.   SODIUM FLUORIDE, DENTAL RINSE, 0.2 % SOLN Place 1 application onto teeth daily  as needed (to strenghten teeth).   timolol (BETIMOL) 0.25 % ophthalmic solution INSTILL 1 DROP INTO LEFT EYE BY OPHTHALMIC ROUTE ONCE DAILY FOR 90 DAYS   timolol (TIMOPTIC) 0.5 % ophthalmic solution Place 1 drop into the right eye every morning.     Allergies:   Bee venom, Beeswax, Fish allergy, Shellfish allergy, Aspirin, Eggs or egg-derived products, Nsaids, and Sulfa antibiotics   Social History   Socioeconomic History   Marital status: Married    Spouse name: Not on file   Number of children: Not on file   Years of education: Not on file   Highest education level: Not on file  Occupational History   Not on file  Tobacco Use   Smoking status: Former    Packs/day: 0.20    Years: 30.00    Total pack years: 6.00    Types: Cigarettes    Quit date: 52    Years since quitting: 25.7   Smokeless tobacco: Never  Vaping Use   Vaping Use: Never used  Substance and Sexual Activity   Alcohol use: Not Currently   Drug use: Never   Sexual activity: Yes  Other Topics Concern   Not on file  Social History Narrative   Diet: Did not answer      Caffeine: Yes      Married, if yes what year: Married, 2010      Do you live in a house, apartment, assisted living, condo, trailer, ect: Condo      Is it one or more stories: Two      How many persons live in your home? 1      Pets: No      Highest level or education completed: Associate degree      Current/Past profession: Did not answer      Exercise:   No               Type and how often:          Living Will: No   DNR: No    POA/HPOA: No      Functional Status:   Do you have difficulty bathing or dressing yourself? No   Do you have difficulty preparing food or eating? No    Do you have difficulty managing your medications? No    Do you have difficulty managing your finances? No    Do you have difficulty affording your medications? No   Social Determinants of Health   Financial Resource Strain: Not on file  Food  Insecurity: Not on file  Transportation Needs: Not on file  Physical Activity: Not on file  Stress: Not on file  Social Connections: Not on file     Family History: The patient's family history includes Arthritis in her maternal grandmother; Colon cancer in her father; Diabetes in her mother; HIV in her brother and brother; Heart attack in her maternal grandfather; Hypertension in her father and mother; Lung cancer in her mother; Sudden Cardiac Death in her father.  ROS:   Please see the history of present illness.     All other systems reviewed and are negative.  EKGs/Labs/Other Studies Reviewed:    The following studies were reviewed today:   EKG:   Recent Labs: 01/31/2022: ALT 20; Platelets 197; TSH 4.39 05/09/2022: BUN 12; Creatinine, Ser 0.70; Hemoglobin 13.6; Potassium 3.9; Sodium 144  Recent Lipid Panel    Component Value Date/Time   CHOL 126 01/31/2022 0918   TRIG 161 (H) 01/31/2022 0918   HDL 46 (L) 01/31/2022 0918   CHOLHDL 2.7 01/31/2022 0918   VLDL 46 (H) 03/20/2021 0020   LDLCALC 56 01/31/2022 0918     Risk Assessment/Calculations:           Physical Exam:     Physical Exam: Blood pressure 136/78, pulse (!) 56, height '5\' 8"'$  (1.727 m), weight 278 lb 9.6 oz (126.4 kg), SpO2 94 %.       GEN:  Well nourished, well developed in no acute distress HEENT: Normal NECK: No JVD; No carotid bruits LYMPHATICS: No lymphadenopathy CARDIAC: RRR , no murmurs, rubs, gallops RESPIRATORY:  Clear to auscultation without rales, wheezing or rhonchi  ABDOMEN: Soft, non-tender, non-distended MUSCULOSKELETAL:  No edema; No deformity  SKIN: Warm and dry NEUROLOGIC:  Alert and oriented x 3    ASSESSMENT:    No diagnosis found.  PLAN:       Chronic diastolic congestive heart failure:   No real symptoms     2.  Hypertension:  BP is well controlled. Cont current meds.     3.  Obesity :  advised her to work on weight loss      Wil see her in 1 year     Medication Adjustments/Labs and Tests Ordered: Current medicines are reviewed at length with the patient today.  Concerns regarding medicines are outlined above.  No orders of the defined types were placed in this encounter.   No orders of the defined types were placed in this encounter.        Signed, Mertie Moores, MD  05/23/2022 7:56 AM    [place]

## 2022-05-22 ENCOUNTER — Ambulatory Visit: Payer: Medicare Other | Attending: Cardiovascular Disease | Admitting: Cardiovascular Disease

## 2022-05-22 ENCOUNTER — Encounter: Payer: Self-pay | Admitting: Cardiovascular Disease

## 2022-05-22 VITALS — BP 136/78 | HR 56 | Ht 68.0 in | Wt 278.6 lb

## 2022-05-22 DIAGNOSIS — I5032 Chronic diastolic (congestive) heart failure: Secondary | ICD-10-CM | POA: Diagnosis not present

## 2022-05-22 DIAGNOSIS — R2232 Localized swelling, mass and lump, left upper limb: Secondary | ICD-10-CM | POA: Diagnosis not present

## 2022-05-22 DIAGNOSIS — I10 Essential (primary) hypertension: Secondary | ICD-10-CM

## 2022-05-22 DIAGNOSIS — M654 Radial styloid tenosynovitis [de Quervain]: Secondary | ICD-10-CM | POA: Diagnosis not present

## 2022-05-22 NOTE — Patient Instructions (Signed)
Medication Instructions:  Your physician recommends that you continue on your current medications as directed. Please refer to the Current Medication list given to you today.  *If you need a refill on your cardiac medications before your next appointment, please call your pharmacy*   Follow-Up: At [facility], you and your health needs are our priority.  As part of our continuing mission to provide you with exceptional heart care, we have created designated Provider Care Teams.  These Care Teams include your primary Cardiologist (physician) and Advanced Practice Providers (APPs -  Physician Assistants and Nurse Practitioners) who all work together to provide you with the care you need, when you need it.   Your next appointment:   1 year(s)  The format for your next appointment:   In Person  Provider:   Mertie Moores, MD

## 2022-05-24 NOTE — Telephone Encounter (Signed)
Yes.I recommend RSV and new COVID-19 booster vaccine.please get both vaccine at your pharmacy.

## 2022-05-27 DIAGNOSIS — E1169 Type 2 diabetes mellitus with other specified complication: Secondary | ICD-10-CM | POA: Diagnosis not present

## 2022-06-06 DIAGNOSIS — Z4420 Encounter for fitting and adjustment of artificial eye, unspecified: Secondary | ICD-10-CM | POA: Diagnosis not present

## 2022-06-13 ENCOUNTER — Ambulatory Visit: Payer: Medicare Other | Admitting: Podiatry

## 2022-06-21 ENCOUNTER — Other Ambulatory Visit: Payer: Self-pay

## 2022-06-21 MED ORDER — INSULIN GLARGINE 100 UNITS/ML SOLOSTAR PEN: 29.0000 [IU] | PEN_INJECTOR | Freq: Two times a day (BID) | SUBCUTANEOUS | 5 refills | Status: DC

## 2022-06-21 NOTE — Telephone Encounter (Signed)
Increase Lantus from 27 units in the morning and at bedtime to 29 units in the morning and at bedtime.   - Please clarify which medication you need refilled to [facility].

## 2022-06-22 ENCOUNTER — Other Ambulatory Visit: Payer: Self-pay | Admitting: *Deleted

## 2022-06-22 MED ORDER — INSULIN GLARGINE 100 UNITS/ML SOLOSTAR PEN
29.0000 [IU] | PEN_INJECTOR | Freq: Two times a day (BID) | SUBCUTANEOUS | 5 refills | Status: DC
Start: 2022-06-22 — End: 2022-11-03

## 2022-06-22 MED ORDER — INSULIN GLARGINE 100 UNITS/ML SOLOSTAR PEN: 29.0000 [IU] | PEN_INJECTOR | Freq: Two times a day (BID) | SUBCUTANEOUS | 5 refills | Status: DC

## 2022-06-22 NOTE — Telephone Encounter (Signed)
[facility] [address] requested refill.    Per Dinah:  Ngetich, Dinah C, NP 22 hours ago (11:20 AM)    Increase Lantus from 27 units in the morning and at bedtime to 29 units in the morning and at bedtime      Called and spoke with pharmacist to insure change.

## 2022-06-26 DIAGNOSIS — E1169 Type 2 diabetes mellitus with other specified complication: Secondary | ICD-10-CM | POA: Diagnosis not present

## 2022-07-09 ENCOUNTER — Other Ambulatory Visit: Payer: Self-pay | Admitting: Family

## 2022-07-12 DIAGNOSIS — M79642 Pain in left hand: Secondary | ICD-10-CM | POA: Diagnosis not present

## 2022-07-17 ENCOUNTER — Other Ambulatory Visit: Payer: Self-pay | Admitting: Family

## 2022-07-17 DIAGNOSIS — G250 Essential tremor: Secondary | ICD-10-CM

## 2022-07-27 DIAGNOSIS — E1169 Type 2 diabetes mellitus with other specified complication: Secondary | ICD-10-CM | POA: Diagnosis not present

## 2022-08-07 ENCOUNTER — Ambulatory Visit (INDEPENDENT_AMBULATORY_CARE_PROVIDER_SITE_OTHER): Payer: Medicare Other | Admitting: Family

## 2022-08-07 ENCOUNTER — Encounter: Payer: Self-pay | Admitting: Family

## 2022-08-07 VITALS — BP 138/78 | HR 56 | Temp 97.9°F | Resp 17 | Ht 68.0 in | Wt 284.2 lb

## 2022-08-07 DIAGNOSIS — F5101 Primary insomnia: Secondary | ICD-10-CM

## 2022-08-07 DIAGNOSIS — E1165 Type 2 diabetes mellitus with hyperglycemia: Secondary | ICD-10-CM | POA: Diagnosis not present

## 2022-08-07 DIAGNOSIS — E782 Mixed hyperlipidemia: Secondary | ICD-10-CM

## 2022-08-07 DIAGNOSIS — I10 Essential (primary) hypertension: Secondary | ICD-10-CM

## 2022-08-07 DIAGNOSIS — Z23 Encounter for immunization: Secondary | ICD-10-CM

## 2022-08-07 DIAGNOSIS — I5032 Chronic diastolic (congestive) heart failure: Secondary | ICD-10-CM

## 2022-08-07 MED ORDER — SEMAGLUTIDE (1 MG/DOSE) 4 MG/3ML ~~LOC~~ SOPN: 1.0000 mg | PEN_INJECTOR | SUBCUTANEOUS | 0 refills | Status: DC

## 2022-08-07 NOTE — Progress Notes (Signed)
Provider: Marlowe Sax FNP-C   Arnold Kester, Nelda Bucks, NP  Patient Care Team: Cicilia Clinger, Nelda Bucks, NP as PCP - General (Family Medicine) Nahser, Wonda Cheng, MD as PCP - Cardiology (Cardiology) Tat, Eustace Quail, DO as Consulting Physician (Neurology)  Extended Emergency Contact Information Primary Emergency Contact: Mcauley,[name] Address: [address] [place], [place] [address] [name] Phone: [phone] Mobile Phone: [phone] Relation: Spouse Secondary Emergency Contact: [place] Mobile Phone: [phone] Relation: Other  Code Status:  Full Code  Goals of care: Advanced Directive information    08/07/2022    8:45 AM  Advanced Directives  Does Patient Have a Medical Advance Directive? No  Would patient like information on creating a medical advance directive? No - Patient declined     Chief Complaint  Patient presents with   Medical Management of Chronic Issues    6 month follow up.   Health Maintenance    Discuss the need for AWV, Urine Microalbumin, Colonoscopy, Eye exam, and Hemoglobin A1C.   Immunizations    Discuss the need for Influneza vaccine, and Covid Booster.    HPI:  Pt is a 71 y.o. female seen today for medical management of chronic diseases.   Had surgery in 05/09/2022 for left dequervains tenosynovitis release and left dorsal forearm mass.  Type 2 DM - has appointment with Dr.Groat for annual eye exam.Also follows up with podiatrist.  CBG 120's -130's except 140's yesterday.denies any signs of hypoglycemia.  Hyperlipidemia -on atorvastatin 40 mg tablet daily Not doing any form exercise. Limited with lower back especially when she stands on concrete area for prolong time.states her ongoing issues with stress level also limiting her exercise sometimes doe not feel like doing anything.does house work.   Insomnia - Takes an hour prior to falling asleep.sleeps few hours then wakes up and unable to sleep  again.  Constipation - has added OTC stool softener takes three times per week or sometimes.   Past Medical History:  Diagnosis Date   Arthritis    CHF (congestive heart failure) (HCC)    DM (diabetes mellitus) ([place])    Essential tremor    head   G6PD deficiency    Glaucoma    Per new patient form   High cholesterol    Per new patient form   HTN (hypertension)    Hyperlipidemia    Past Surgical History:  Procedure Laterality Date   ABDOMINAL HYSTERECTOMY     Dr. Owens Shark / Per new patient form   ANKLE SURGERY Right    x 2   BREAST LUMPECTOMY Left    benign   DG  BONE DENSITY ([place] [facility])     Per new patient form   DIAGNOSTIC MAMMOGRAM     Per new patient form   EYE SURGERY Left    age 34 - L eye removed, Dr. Drenda Freeze / Per new patient form   MINOR RELEASE DORSAL COMPARTMENT (DEQUERVAINS) Left 05/09/2022   Procedure: Left dequervains release and left dorsal forearm mass excision;  Surgeon: Orene Desanctis, MD;  Location: [facility];  Service: Orthopedics;  Laterality: Left;  Regional block   REDUCTION MAMMAPLASTY Bilateral    1998    Allergies  Allergen Reactions   Bee Venom Anaphylaxis   Beeswax Anaphylaxis    bees   Fish Allergy Anaphylaxis   Shellfish Allergy Anaphylaxis    And all seafood   Aspirin Other (See Comments)  G6P Deficiency   Eggs Or Egg-Derived Products Hives   Nsaids Other (See Comments)    G6P Deficiency   Sulfa Antibiotics Other (See Comments)    G6P Deficiency     Allergies as of 08/07/2022       Reactions   Bee Venom Anaphylaxis   Beeswax Anaphylaxis   bees   Fish Allergy Anaphylaxis   Shellfish Allergy Anaphylaxis   And all seafood   Aspirin Other (See Comments)   G6P Deficiency   Eggs Or Egg-derived Products Hives   Nsaids Other (See Comments)   G6P Deficiency   Sulfa Antibiotics Other (See Comments)   G6P Deficiency         Medication List        Accurate as of August 07, 2022  9:01 AM. If you have any  questions, ask your nurse or doctor.          STOP taking these medications    Betimol 0.25 % ophthalmic solution Generic drug: timolol Stopped by: Sandrea Hughs, NP   ibuprofen 600 MG tablet Commonly known as: ADVIL Stopped by: Sandrea Hughs, NP       TAKE these medications    acetaminophen 500 MG tablet Commonly known as: TYLENOL Take 2 tablets by mouth every 6 (six) hours as needed.   atorvastatin 40 MG tablet Commonly known as: LIPITOR TAKE 1 TABLET BY MOUTH EVERYDAY AT BEDTIME   B-D ULTRAFINE III SHORT PEN 31G X 8 MM Misc Generic drug: Insulin Pen Needle SMARTSIG:injection Twice Daily   BETA CAROTENE PO Take 1 tablet by mouth daily with breakfast.   BIOTIN PO Take by mouth daily.   busPIRone 7.5 MG tablet Commonly known as: BUSPAR Take 7.5 mg by mouth daily with breakfast.   Fish Oil 1200 MG Caps Take 1,200 mg by mouth daily with breakfast.   Hair/Skin/Nails/Biotin Tabs Take 1 tablet by mouth daily with breakfast.   hydrochlorothiazide 25 MG tablet Commonly known as: HYDRODIURIL Take 25 mg by mouth daily with breakfast.   insulin glargine 100 unit/mL Sopn Commonly known as: LANTUS Inject 29 Units into the skin in the morning and at bedtime.   latanoprost 0.005 % ophthalmic solution Commonly known as: XALATAN Place 1 drop into the right eye at bedtime.   losartan 100 MG tablet Commonly known as: COZAAR Take 100 mg by mouth daily with breakfast.   Maxitrol 0.1 % Oint Generic drug: neomycin-polymyxin-dexameth Place into the right eye as needed. Apply to eye.   multivitamin with minerals Tabs tablet Take 1 tablet by mouth daily with breakfast.   Neuriva Caps Take 1 capsule by mouth daily with breakfast.   propranolol ER 120 MG 24 hr capsule Commonly known as: INDERAL LA TAKE 1 CAPSULE (120 MG TOTAL) BY MOUTH AT BEDTIME.   SODIUM FLUORIDE (DENTAL RINSE) 0.2 % Soln Place 1 application onto teeth daily as needed (to strenghten  teeth).   timolol 0.5 % ophthalmic solution Commonly known as: TIMOPTIC Place 1 drop into the right eye every morning.   traZODone 50 MG tablet Commonly known as: DESYREL Take 50 mg by mouth at bedtime as needed for sleep.        Review of Systems  Constitutional:  Negative for appetite change, chills, fatigue, fever and unexpected weight change.  HENT:  Negative for congestion, dental problem, ear discharge, ear pain, facial swelling, hearing loss, nosebleeds, postnasal drip, rhinorrhea, sinus pressure, sinus pain, sneezing, sore throat, tinnitus and trouble swallowing.   Eyes:  Negative for pain, discharge, redness, itching and visual disturbance.  Respiratory:  Negative for cough, chest tightness, shortness of breath and wheezing.   Cardiovascular:  Negative for chest pain, palpitations and leg swelling.  Gastrointestinal:  Negative for abdominal distention, abdominal pain, blood in stool, constipation, diarrhea, nausea and vomiting.  Endocrine: Negative for cold intolerance, heat intolerance, polydipsia, polyphagia and polyuria.  Genitourinary:  Negative for difficulty urinating, dysuria, flank pain, frequency and urgency.  Musculoskeletal:  Negative for arthralgias, back pain, gait problem, joint swelling, myalgias, neck pain and neck stiffness.  Skin:  Negative for color change, pallor, rash and wound.  Neurological:  Negative for dizziness, syncope, speech difficulty, weakness, light-headedness, numbness and headaches.  Hematological:  Does not bruise/bleed easily.  Psychiatric/Behavioral:  Positive for sleep disturbance. Negative for agitation, behavioral problems, confusion, hallucinations, self-injury and suicidal ideas. The patient is not nervous/anxious.        Does not sleep through out the night.has a lot problems with her children.     Immunization History  Administered Date(s) Administered   PFIZER(Purple Top)SARS-COV-2 Vaccination 09/14/2019, 10/22/2019, 06/26/2020,  01/19/2021, 06/21/2021, 06/14/2022   PNEUMOCOCCAL CONJUGATE-20 01/31/2022   Pneumococcal Polysaccharide-23 06/11/2020   Respiratory Syncytial Virus Vaccine,Recomb Aduvanted(Arexvy) 06/15/2022   Zoster Recombinat (Shingrix) 09/14/2017, 12/03/2017   Pertinent  Health Maintenance Due  Topic Date Due   COLONOSCOPY (Pts 45-12yr Insurance coverage will need to be confirmed)  Never done   INFLUENZA VACCINE  Never done   OPHTHALMOLOGY EXAM  07/21/2022   HEMOGLOBIN A1C  08/03/2022   FOOT EXAM  12/01/2022   MAMMOGRAM  11/24/2023   DEXA SCAN  Completed      10/06/2021    8:30 AM 11/01/2021    9:34 AM 12/02/2021   10:03 AM 01/31/2022    8:33 AM 08/07/2022    8:45 AM  Fall Risk  Falls in the past year?  0 0 0 0  Was there an injury with Fall?  0 0 0 0  Fall Risk Category Calculator  0 0 0 0  Fall Risk Category  Low Low Low Low  Patient Fall Risk Level _0   Patient at Risk for Falls Due to  No Fall Risks No Fall Risks No Fall Risks No Fall Risks  Fall risk Follow up  Falls evaluation completed Falls evaluation completed Falls evaluation completed Falls evaluation completed   Functional Status Survey:    Vitals:   08/07/22 0842  BP: 138/78  Pulse: (!) 56  Resp: 17  Temp: 97.9 F (36.6 C)  SpO2: 96%  Weight: 284 lb 3.2 oz (128.9 kg)  Height: _1  (1.727 m)   Body mass index is 43.21 kg/m. Physical Exam Vitals reviewed.  Constitutional:      General: She is not in acute distress.    Appearance: Normal appearance. She is obese. She is not ill-appearing or diaphoretic.  HENT:     Head: Normocephalic.     Right Ear: Tympanic membrane, ear canal and external ear normal. There is no impacted cerumen.     Left Ear: Tympanic membrane, ear canal and external ear normal. There is no impacted cerumen.     Nose: Nose normal. No congestion or rhinorrhea.     Mouth/Throat:     Mouth: Mucous membranes are moist.     Pharynx:  Oropharynx is clear. No oropharyngeal exudate or posterior oropharyngeal erythema.  Eyes:     General: No scleral icterus.  Right eye: No discharge.        Left eye: No discharge.     Extraocular Movements: Extraocular movements intact.     Conjunctiva/sclera: Conjunctivae normal.     Pupils: Pupils are equal, round, and reactive to light.  Neck:     Vascular: No carotid bruit.  Cardiovascular:     Rate and Rhythm: Normal rate and regular rhythm.     Pulses: Normal pulses.     Heart sounds: Normal heart sounds. No murmur heard.    No friction rub. No gallop.  Pulmonary:     Effort: Pulmonary effort is normal. No respiratory distress.     Breath sounds: Normal breath sounds. No wheezing, rhonchi or rales.  Chest:     Chest wall: No tenderness.  Abdominal:     General: Bowel sounds are normal. There is no distension.     Palpations: Abdomen is soft. There is no mass.     Tenderness: There is no abdominal tenderness. There is no right CVA tenderness, left CVA tenderness, guarding or rebound.  Musculoskeletal:        General: No swelling or tenderness. Normal range of motion.     Cervical back: Normal range of motion. No rigidity or tenderness.     Right lower leg: No edema.     Left lower leg: No edema.  Lymphadenopathy:     Cervical: No cervical adenopathy.  Skin:    General: Skin is warm and dry.     Coloration: Skin is not pale.     Findings: No bruising, erythema, lesion or rash.  Neurological:     Mental Status: She is alert and oriented to person, place, and time.     Cranial Nerves: No cranial nerve deficit.     Sensory: No sensory deficit.     Motor: No weakness.     Coordination: Coordination normal.     Gait: Gait normal.  Psychiatric:        Mood and Affect: Mood normal.        Speech: Speech normal.        Behavior: Behavior normal.        Thought Content: Thought content normal.        Judgment: Judgment normal.     Labs reviewed: Recent Labs     10/05/21 1002 10/05/21 2041 10/06/21 0413 01/31/22 0918 05/09/22 0640  NA 129*  --  133* 141 144  K 3.9  --  3.6 4.3 3.9  CL 98  --  102 105 103  CO2 19*  --  20* 30  --   GLUCOSE 392*  --  246* 131* 139*  BUN 15  --  _0 CREATININE 0.83   < > 1.16* 0.77 0.70  CALCIUM 9.6  --  8.9 9.8  --    < > = values in this interval not displayed.   Recent Labs    08/10/21 1537 10/03/21 1648 01/31/22 0918  AST 37 44* 22  ALT 26 38 20  ALKPHOS 69 94  --   BILITOT 1.4* 1.4* 0.8  PROT 7.0 7.6 6.9  ALBUMIN 4.0 4.3  --    Recent Labs    08/10/21 1845 10/03/21 1648 10/05/21 1002 10/05/21 2041 10/06/21 0413 01/31/22 0918 05/09/22 0640  WBC 8.2   < > 9.4 10.9* 8.4 6.8  --   NEUTROABS 4.7  --  5.4  --   --  3,305  --   HGB 13.7   < >  15.6* 14.1 13.3 14.0 13.6  HCT 40.3   < > 43.9 39.4 38.4 42.4 40.0  MCV 98.1   < > 91.8 90.2 92.3 97.0  --   PLT 182   < > 205 181 156 197  --    < > = values in this interval not displayed.   Lab Results  Component Value Date   TSH 4.39 01/31/2022   Lab Results  Component Value Date   HGBA1C 5.8 (H) 01/31/2022   Lab Results  Component Value Date   CHOL 126 01/31/2022   HDL 46 (L) 01/31/2022   LDLCALC 56 01/31/2022   TRIG 161 (H) 01/31/2022   CHOLHDL 2.7 01/31/2022    Significant Diagnostic Results in last 30 days:  No results found.  Assessment/Plan 1. Type 2 diabetes mellitus with hyperglycemia, without long-term current use of insulin (HCC) Lab Results  Component Value Date   HGBA1C 5.8 (H) 01/31/2022  Controlled  - Request semaglutide  - Microalbumin/Creatinine Ratio, Urine - Hemoglobin A1c - CBC with Differential/Platelet - CMP with eGFR([facility]) - Semaglutide, 1 MG/DOSE, 4 MG/3ML SOPN; Inject 1 mg as directed once a week.  Dispense: 3 mL; Refill: 0 - TSH  2. Primary hypertension B/p at goal -Continue on losartan and hydrochlorothiazide - CBC with Differential/Platelet - CMP with eGFR([facility])  3. Mixed  hyperlipidemia LDL at goal -Continue on atorvastatin No muscle weaknes or mylagia reported Dietary modification and exercise at least 3 times per week for 30 minutes advised - Lipid Panel  4. Chronic diastolic CHF (congestive heart failure) (HCC) No signs of fluid overload -Continue hydrochlorothiazide -Creatinine at baseline - CBC with Differential/Platelet - CMP with eGFR([facility])  5. Primary insomnia Continue on trazodone as needed Sleep hygiene advised  6. Need for influenza vaccination Afebrile Flut shot administered by CMA no acute reaction reported.  - Flu vaccine, recombinant, trivalent, inj (Flublok, egg free)  Family/ staff Communication: Reviewed plan of care with patient and husband verbalized understanding  Labs/tests ordered:  - Microalbumin/Creatinine Ratio, Urine - Hemoglobin A1c - CBC with Differential/Platelet - CMP with eGFR([facility]) - Lipid Panel - TSH  Next Appointment : Return in about 6 months (around 02/05/2023) for medical mangement of chronic issues.schedule also Annual wellness visit.   Sandrea Hughs, NP

## 2022-08-08 LAB — CBC WITH DIFFERENTIAL/PLATELET
Absolute Monocytes: 638 cells/uL (ref 200–950)
Basophils Absolute: 53 cells/uL (ref 0–200)
Basophils Relative: 0.7 %
Eosinophils Absolute: 251 cells/uL (ref 15–500)
Eosinophils Relative: 3.3 %
HCT: 41.7 % (ref 35.0–45.0)
Hemoglobin: 14 g/dL (ref 11.7–15.5)
Lymphs Abs: 2858 cells/uL (ref 850–3900)
MCH: 32.6 pg (ref 27.0–33.0)
MCHC: 33.6 g/dL (ref 32.0–36.0)
MCV: 97.2 fL (ref 80.0–100.0)
MPV: 11.4 fL (ref 7.5–12.5)
Monocytes Relative: 8.4 %
Neutro Abs: 3800 cells/uL (ref 1500–7800)
Neutrophils Relative %: 50 %
Platelets: 197 10*3/uL (ref 140–400)
RBC: 4.29 10*6/uL (ref 3.80–5.10)
RDW: 10.9 % — ABNORMAL LOW (ref 11.0–15.0)
Total Lymphocyte: 37.6 %
WBC: 7.6 10*3/uL (ref 3.8–10.8)

## 2022-08-08 LAB — LIPID PANEL
Cholesterol: 121 mg/dL (ref <200)
HDL: 46 mg/dL — ABNORMAL LOW (ref >=50)
LDL Cholesterol (Calc): 50 mg/dL (calc)
Non-HDL Cholesterol (Calc): 75 mg/dL (calc) (ref <130)
Total CHOL/HDL Ratio: 2.6 (calc) (ref <5.0)
Triglycerides: 174 mg/dL — ABNORMAL HIGH (ref <150)

## 2022-08-08 LAB — COMPLETE METABOLIC PANEL WITH GFR
AG Ratio: 1.7 (calc) (ref 1.0–2.5)
ALT: 23 U/L (ref 6–29)
AST: 24 U/L (ref 10–35)
Albumin: 4.5 g/dL (ref 3.6–5.1)
Alkaline phosphatase (APISO): 80 U/L (ref 37–153)
BUN: 11 mg/dL (ref 7–25)
CO2: 25 mmol/L (ref 20–32)
Calcium: 9.9 mg/dL (ref 8.6–10.4)
Chloride: 105 mmol/L (ref 98–110)
Creat: 0.71 mg/dL (ref 0.60–1.00)
Globulin: 2.6 g/dL (calc) (ref 1.9–3.7)
Glucose, Bld: 144 mg/dL — ABNORMAL HIGH (ref 65–139)
Potassium: 3.9 mmol/L (ref 3.5–5.3)
Sodium: 140 mmol/L (ref 135–146)
Total Bilirubin: 0.5 mg/dL (ref 0.2–1.2)
Total Protein: 7.1 g/dL (ref 6.1–8.1)
eGFR: 90 mL/min/{1.73_m2} (ref >=60)

## 2022-08-08 LAB — HEMOGLOBIN A1C
Hgb A1c MFr Bld: 6.2 % of total Hgb — ABNORMAL HIGH (ref <5.7)
Mean Plasma Glucose: 131 mg/dL
eAG (mmol/L): 7.3 mmol/L

## 2022-08-08 LAB — TSH: TSH: 5.54 mIU/L — ABNORMAL HIGH (ref 0.40–4.50)

## 2022-08-08 LAB — MICROALBUMIN / CREATININE URINE RATIO
Creatinine, Urine: 116 mg/dL (ref 20–275)
Microalb Creat Ratio: 34 mcg/mg creat — ABNORMAL HIGH (ref <30)
Microalb, Ur: 3.9 mg/dL

## 2022-08-14 ENCOUNTER — Encounter: Payer: Self-pay | Admitting: Family

## 2022-08-14 ENCOUNTER — Telehealth (INDEPENDENT_AMBULATORY_CARE_PROVIDER_SITE_OTHER): Payer: Medicare Other | Admitting: Family

## 2022-08-14 DIAGNOSIS — Z0000 Encounter for general adult medical examination without abnormal findings: Secondary | ICD-10-CM | POA: Diagnosis not present

## 2022-08-14 DIAGNOSIS — Z1211 Encounter for screening for malignant neoplasm of colon: Secondary | ICD-10-CM

## 2022-08-14 NOTE — Progress Notes (Signed)
This service is provided via telemedicine  No vital signs collected/recorded due to the encounter was a telemedicine visit.   Location of patient (ex: home, work):  Home  Patient consents to a telephone visit:  Yes   Location of the provider (ex: office, home):  [employer].   Name of any referring provider:  Aleck Locklin, Nelda Bucks, NP   Names of all persons participating in the telemedicine service and their role in the encounter:  Patient, [name], [place], [place], Webb Silversmith, NP.    Time spent on call:  8 minutes spent on the phone with Medical Assistant.       Subjective:   Wendy Obrien is a 71 y.o. female who presents for Medicare Annual (Subsequent) preventive examination.  Review of Systems     Cardiac Risk Factors include: advanced age (>66mn, >>38women);diabetes mellitus;obesity (BMI >30kg/m2);smoking/ tobacco exposure;hypertension;sedentary lifestyle     Objective:    Today's Vitals   08/14/22 0913  PainSc: 5    There is no height or weight on file to calculate BMI.     08/14/2022    9:01 AM 08/07/2022    8:45 AM 05/09/2022    6:10 AM 01/31/2022    8:33 AM 12/02/2021   10:03 AM 11/01/2021    9:34 AM 10/05/2021    7:47 PM  Advanced Directives  Does Patient Have a Medical Advance Directive? No No No No No No No  Would patient like information on creating a medical advance directive? No - Patient declined No - Patient declined No - Patient declined No - Patient declined No - Patient declined No - Patient declined No - Patient declined    Current Medications (verified) Outpatient Encounter Medications as of 08/14/2022  Medication Sig   acetaminophen (TYLENOL) 500 MG tablet Take 2 tablets by mouth every 6 (six) hours as needed.   atorvastatin (LIPITOR) 40 MG tablet TAKE 1 TABLET BY MOUTH EVERYDAY AT BEDTIME   B-D ULTRAFINE III SHORT PEN 31G X 8 MM MISC SMARTSIG:injection Twice Daily   BETA CAROTENE PO Take 1 tablet by mouth daily with  breakfast.   BIOTIN PO Take by mouth daily.   busPIRone (BUSPAR) 7.5 MG tablet Take 7.5 mg by mouth daily with breakfast.   hydrochlorothiazide (HYDRODIURIL) 25 MG tablet Take 25 mg by mouth daily with breakfast.   insulin glargine (LANTUS) 100 unit/mL SOPN Inject 29 Units into the skin in the morning and at bedtime.   latanoprost (XALATAN) 0.005 % ophthalmic solution Place 1 drop into the right eye at bedtime.   losartan (COZAAR) 100 MG tablet Take 100 mg by mouth daily with breakfast.   MAXITROL 3.5-10000-0.1 OINT Place into the right eye as needed. Apply to eye.   Misc Natural Products (NEURIVA) CAPS Take 1 capsule by mouth daily with breakfast.   Multiple Vitamin (MULTIVITAMIN WITH MINERALS) TABS tablet Take 1 tablet by mouth daily with breakfast.   Multiple Vitamins-Minerals (HAIR/SKIN/NAILS/BIOTIN) TABS Take 1 tablet by mouth daily with breakfast.   Omega-3 Fatty Acids (FISH OIL) 1200 MG CAPS Take 1,200 mg by mouth daily with breakfast.   propranolol ER (INDERAL LA) 120 MG 24 hr capsule TAKE 1 CAPSULE (120 MG TOTAL) BY MOUTH AT BEDTIME.   Semaglutide, 1 MG/DOSE, 4 MG/3ML SOPN Inject 1 mg as directed once a week.   SODIUM FLUORIDE, DENTAL RINSE, 0.2 % SOLN Place 1 application onto teeth daily as needed (to strenghten teeth).   timolol (TIMOPTIC) 0.5 % ophthalmic solution Place  1 drop into the right eye every morning.   traZODone (DESYREL) 50 MG tablet Take 50 mg by mouth at bedtime as needed for sleep.   No facility-administered encounter medications on file as of 08/14/2022.    Allergies (verified) Bee venom, Beeswax, Fish allergy, Shellfish allergy, Aspirin, Eggs or egg-derived products, Nsaids, and Sulfa antibiotics   History: Past Medical History:  Diagnosis Date   Arthritis    CHF (congestive heart failure) (HCC)    DM (diabetes mellitus) ([place])    Essential tremor    head   G6PD deficiency    Glaucoma    Per new patient form   High cholesterol    Per new patient form    HTN (hypertension)    Hyperlipidemia    Past Surgical History:  Procedure Laterality Date   ABDOMINAL HYSTERECTOMY     Dr. Owens Shark / Per new patient form   ANKLE SURGERY Right    x 2   BREAST LUMPECTOMY Left    benign   DG  BONE DENSITY ([place] [facility])     Per new patient form   DIAGNOSTIC MAMMOGRAM     Per new patient form   EYE SURGERY Left    age 31 - L eye removed, Dr. Drenda Freeze / Per new patient form   MINOR RELEASE DORSAL COMPARTMENT (DEQUERVAINS) Left 05/09/2022   Procedure: Left dequervains release and left dorsal forearm mass excision;  Surgeon: Orene Desanctis, MD;  Location: [facility];  Service: Orthopedics;  Laterality: Left;  Regional block   REDUCTION MAMMAPLASTY Bilateral    1998   Family History  Problem Relation Age of Onset   Lung cancer Mother    Hypertension Mother    Diabetes Mother    Hypertension Father    Colon cancer Father    Sudden Cardiac Death Father    HIV Brother    HIV Brother    Arthritis Maternal Grandmother    Heart attack Maternal Grandfather    Social History   Socioeconomic History   Marital status: Married    Spouse name: Not on file   Number of children: Not on file   Years of education: Not on file   Highest education level: Not on file  Occupational History   Not on file  Tobacco Use   Smoking status: Former    Packs/day: 0.20    Years: 30.00    Total pack years: 6.00    Types: Cigarettes    Quit date: 1998    Years since quitting: 25.9   Smokeless tobacco: Never  Vaping Use   Vaping Use: Never used  Substance and Sexual Activity   Alcohol use: Not Currently   Drug use: Never   Sexual activity: Yes  Other Topics Concern   Not on file  Social History Narrative   Diet: Did not answer      Caffeine: Yes      Married, if yes what year: Married, 2010      Do you live in a house, apartment, assisted living, condo, trailer, ect: Condo      Is it one or more stories: Two      How many persons live in your  home? 1      Pets: No      Highest level or education completed: Associate degree      Current/Past profession: Did not answer      Exercise:   No  Type and how often:          Living Will: No   DNR: No    POA/HPOA: No      Functional Status:   Do you have difficulty bathing or dressing yourself? No   Do you have difficulty preparing food or eating? No    Do you have difficulty managing your medications? No    Do you have difficulty managing your finances? No    Do you have difficulty affording your medications? No   Social Determinants of Radio broadcast assistant Strain: Not on file  Food Insecurity: Not on file  Transportation Needs: Not on file  Physical Activity: Not on file  Stress: Not on file  Social Connections: Not on file    Tobacco Counseling Counseling given: Not Answered   Clinical Intake:  Pre-visit preparation completed: No  Pain : 0-10 Pain Score: 5  Pain Type: Chronic pain Pain Location: Ankle (lower back) Pain Orientation: Lower (both ankles) Pain Radiating Towards: No Pain Descriptors / Indicators: Aching Pain Frequency: Intermittent Pain Relieving Factors: rest,Tylenol Effect of Pain on Daily Activities: walking  Pain Relieving Factors: rest,Tylenol  BMI - recorded: 43.21 Nutritional Status: BMI > 30  Obese Nutritional Risks: None Diabetes: Yes CBG done?: Yes (130) CBG resulted in Enter/ Edit results?: Yes (130) Did pt. bring in CBG monitor from home?: Yes (100's - 165) Glucose Meter Downloaded?: No (Telephone)  How often do you need to have someone help you when you read instructions, pamphlets, or other written materials from your doctor or pharmacy?: 1 - Never What is the last grade level you completed in school?: 14 yrs  Diabetic?Yes  Interpreter Needed?: No      Activities of Daily Living    08/14/2022    9:23 AM 05/09/2022    6:17 AM  In your present state of health, do you have any difficulty  performing the following activities:  Hearing? 0 0  Vision? 0 0  Difficulty concentrating or making decisions? 0 0  Walking or climbing stairs? 0 0  Dressing or bathing? 0 0  Doing errands, shopping? 0   Preparing Food and eating ? N   Using the Toilet? Y   Comment constipation   In the past six months, have you accidently leaked urine? Y   Comment sometimes when shopping   Do you have problems with loss of bowel control? N   Managing your Medications? N   Managing your Finances? N   Housekeeping or managing your Housekeeping? N     Patient Care Team: Emanuel Dowson, Nelda Bucks, NP as PCP - General (Family Medicine) Nahser, Wonda Cheng, MD as PCP - Cardiology (Cardiology) Tat, Eustace Quail, DO as Consulting Physician (Neurology)  Indicate any recent Medical Services you may have received from other than [facility] providers in the past year (date may be approximate).     Assessment:   This is a routine wellness examination for [place].  Hearing/Vision screen Hearing Screening - Comments:: No Hearing Concerns.  Vision Screening - Comments:: No vision concerns. Patient last eye exam July 2023. Patient wears prescription glasses.   Dietary issues and exercise activities discussed: Current Exercise Habits: The patient does not participate in regular exercise at present, Exercise limited by: Other - see comments (lower back pain and ankles)   Goals Addressed             This Visit's Progress    Patient Stated       Loss weight  Depression Screen    08/14/2022    8:55 AM  PHQ 2/9 Scores  PHQ - 2 Score 2  PHQ- 9 Score 4  Exception Documentation Other- indicate reason in comment box  Not completed Patient states that she talked about this at last visit and she wants to speak to someone.    Fall Risk    08/14/2022    8:55 AM 08/07/2022    8:45 AM 01/31/2022    8:33 AM 12/02/2021   10:03 AM 11/01/2021    9:34 AM  Fall Risk   Falls in the past year? 0 0 0 0 0  Number falls in past  yr: 0 0 0 0 0  Injury with Fall? 0 0 0 0 0  Risk for fall due to : No Fall Risks No Fall Risks No Fall Risks No Fall Risks No Fall Risks  Follow up Falls evaluation completed Falls evaluation completed Falls evaluation completed Falls evaluation completed Falls evaluation completed    [place]:  Any stairs in or around the home? Yes  If so, are there any without handrails? No  Home free of loose throw rugs in walkways, pet beds, electrical cords, etc? No  Adequate lighting in your home to reduce risk of falls? Yes   ASSISTIVE DEVICES UTILIZED TO PREVENT FALLS:  Life alert? No  Use of a cane, walker or w/c? No  Grab bars in the bathroom? Yes  Shower chair or bench in shower? No  Elevated toilet seat or a handicapped toilet? Yes   TIMED UP AND GO:  Was the test performed? No .  Length of time to ambulate 10 feet: N/A sec.   Gait steady and fast without use of assistive device  Cognitive Function:        08/14/2022    8:59 AM  6CIT Screen  What Year? 0 points  What month? 0 points  What time? 0 points  Count back from 20 0 points  Months in reverse 0 points  Repeat phrase 2 points  Total Score 2 points    Immunizations Immunization History  Administered Date(s) Administered   Influenza,trivalent, recombinat, inj, PF 08/07/2022   PFIZER(Purple Top)SARS-COV-2 Vaccination 09/14/2019, 10/22/2019, 06/26/2020, 01/19/2021, 06/21/2021, 06/14/2022   PNEUMOCOCCAL CONJUGATE-20 01/31/2022   Pneumococcal Polysaccharide-23 06/11/2020   Respiratory Syncytial Virus Vaccine,Recomb Aduvanted(Arexvy) 06/15/2022   Zoster Recombinat (Shingrix) 09/14/2017, 12/03/2017    TDAP status: Due, Education has been provided regarding the importance of this vaccine. Advised may receive this vaccine at local pharmacy or Health Dept. Aware to provide a copy of the vaccination record if obtained from local pharmacy or Health Dept. Verbalized acceptance and  understanding.  Flu Vaccine status: Up to date  Pneumococcal vaccine status: Up to date  Covid-19 vaccine status: Completed vaccines  Qualifies for Shingles Vaccine? Yes   Zostavax completed Yes   Shingrix Completed?: Yes  Screening Tests Health Maintenance  Topic Date Due   DTaP/Tdap/Td (1 - Tdap) Never done   COLONOSCOPY (Pts 45-67yr Insurance coverage will need to be confirmed)  Never done   COVID-19 Vaccine (7 - 2023-24 season) 08/09/2022   FOOT EXAM  12/01/2022   HEMOGLOBIN A1C  02/05/2023   OPHTHALMOLOGY EXAM  03/12/2023   Diabetic kidney evaluation - GFR measurement  08/08/2023   Diabetic kidney evaluation - Urine ACR  08/08/2023   Medicare Annual Wellness (AWV)  08/15/2023   MAMMOGRAM  11/24/2023   Pneumonia Vaccine 72 Years old  Completed  INFLUENZA VACCINE  Completed   DEXA SCAN  Completed   Hepatitis C Screening  Completed   Zoster Vaccines- Shingrix  Completed   HPV VACCINES  Aged Out    Health Maintenance  Health Maintenance Due  Topic Date Due   DTaP/Tdap/Td (1 - Tdap) Never done   COLONOSCOPY (Pts 45-47yr Insurance coverage will need to be confirmed)  Never done   COVID-19 Vaccine (7 - 2023-24 season) 08/09/2022    Colorectal cancer screening: Referral to GI placed 08/14/2022. Pt aware the office will call re: appt.  Mammogram status: Completed 11/23/2021. Repeat every year  Bone Density status: Completed 12/25/2019. Results reflect: Bone density results: OSTEOPENIA. Repeat every 3 years.  Lung Cancer Screening: (Low Dose CT Chest recommended if Age 72-80years, 30 pack-year currently smoking OR have quit w/in 15years.) does qualify.   Lung Cancer Screening Referral: Declined   Additional Screening:  Hepatitis C Screening: does qualify; Completed yes  Vision Screening: Recommended annual ophthalmology exams for early detection of glaucoma and other disorders of the eye. Is the patient up to date with their annual eye exam?  Yes  Who is the  provider or what is the name of the office in which the patient attends annual eye exams? Dr.Groat  If pt is not established with a provider, would they like to be referred to a provider to establish care? No .   Dental Screening: Recommended annual dental exams for proper oral hygiene  Community Resource Referral / Chronic Care Management: CRR required this visit?  No   CCM required this visit?  No      Plan:     I have personally reviewed and noted the following in the patient's chart:   Medical and social history Use of alcohol, tobacco or illicit drugs  Current medications and supplements including opioid prescriptions. Patient is not currently taking opioid prescriptions. Functional ability and status Nutritional status Physical activity Advanced directives List of other physicians Hospitalizations, surgeries, and ER visits in previous 12 months Vitals Screenings to include cognitive, depression, and falls Referrals and appointments  In addition, I have reviewed and discussed with patient certain preventive protocols, quality metrics, and best practice recommendations. A written personalized care plan for preventive services as well as general preventive health recommendations were provided to patient.     DSandrea Hughs NP   08/14/2022   Nurse Notes:  - Due for Tetanus vaccine aware to get vaccine at the pharmacy  - Also due for colonoscopy.Had Cologuard in another practice in the past but would like referral to GI for colonoscopy since her Dad had colon cancer.

## 2022-08-14 NOTE — Patient Instructions (Signed)
Wendy Obrien , Thank you for taking time to come for your Medicare Wellness Visit. I appreciate your ongoing commitment to your health goals. Please review the following plan we discussed and let me know if I can assist you in the future.   Screening recommendations/referrals: Colonoscopy : Due referral order today Mammogram : Up to date  Bone Density : Up to date  Recommended yearly ophthalmology/optometry visit for glaucoma screening and checkup Recommended yearly dental visit for hygiene and checkup  Vaccinations: Influenza vaccine- due annually in September/October Pneumococcal vaccine : Up to date  Tdap vaccine Due please get vaccine at the pharmacy  Shingles vaccine : Up to date     Advanced directives: No   Conditions/risks identified: advanced age (>45mn, >>7women);diabetes mellitus;obesity (BMI >30kg/m2);smoking/ tobacco exposure;hypertension;sedentary lifestyle  Next appointment: 1 year    Preventive Care 667Years and Older, Female Preventive care refers to lifestyle choices and visits with your health care provider that can promote health and wellness. What does preventive care include? A yearly physical exam. This is also called an annual well check. Dental exams once or twice a year. Routine eye exams. Ask your health care provider how often you should have your eyes checked. Personal lifestyle choices, including: Daily care of your teeth and gums. Regular physical activity. Eating a healthy diet. Avoiding tobacco and drug use. Limiting alcohol use. Practicing safe sex. Taking low-dose aspirin every day. Taking vitamin and mineral supplements as recommended by your health care provider. What happens during an annual well check? The services and screenings done by your health care provider during your annual well check will depend on your age, overall health, lifestyle risk factors, and family history of disease. Counseling  Your health care provider may ask you  questions about your: Alcohol use. Tobacco use. Drug use. Emotional well-being. Home and relationship well-being. Sexual activity. Eating habits. History of falls. Memory and ability to understand (cognition). Work and work eStatistician Reproductive health. Screening  You may have the following tests or measurements: Height, weight, and BMI. Blood pressure. Lipid and cholesterol levels. These may be checked every 5 years, or more frequently if you are over 519years old. Skin check. Lung cancer screening. You may have this screening every year starting at age 7576if you have a 30-pack-year history of smoking and currently smoke or have quit within the past 15 years. Fecal occult blood test (FOBT) of the stool. You may have this test every year starting at age 72 Flexible sigmoidoscopy or colonoscopy. You may have a sigmoidoscopy every 5 years or a colonoscopy every 10 years starting at age 72 Hepatitis C blood test. Hepatitis B blood test. Sexually transmitted disease (STD) testing. Diabetes screening. This is done by checking your blood sugar (glucose) after you have not eaten for a while (fasting). You may have this done every 1-3 years. Bone density scan. This is done to screen for osteoporosis. You may have this done starting at age 55103 Mammogram. This may be done every 1-2 years. Talk to your health care provider about how often you should have regular mammograms. Talk with your health care provider about your test results, treatment options, and if necessary, the need for more tests. Vaccines  Your health care provider may recommend certain vaccines, such as: Influenza vaccine. This is recommended every year. Tetanus, diphtheria, and acellular pertussis (Tdap, Td) vaccine. You may need a Td booster every 10 years. Zoster vaccine. You may need this after age 72 Pneumococcal 13-valent conjugate (  PCV13) vaccine. One dose is recommended after age 71. Pneumococcal polysaccharide  (PPSV23) vaccine. One dose is recommended after age 54. Talk to your health care provider about which screenings and vaccines you need and how often you need them. This information is not intended to replace advice given to you by your health care provider. Make sure you discuss any questions you have with your health care provider. Document Released: 09/24/2015 Document Revised: 05/17/2016 Document Reviewed: 06/29/2015 Elsevier Interactive Patient Education  2017 [place] Prevention in the Home Falls can cause injuries. They can happen to people of all ages. There are many things you can do to make your home safe and to help prevent falls. What can I do on the outside of my home? Regularly fix the edges of walkways and driveways and fix any cracks. Remove anything that might make you trip as you walk through a door, such as a raised step or threshold. Trim any bushes or trees on the path to your home. Use bright outdoor lighting. Clear any walking paths of anything that might make someone trip, such as rocks or tools. Regularly check to see if handrails are loose or broken. Make sure that both sides of any steps have handrails. Any raised decks and porches should have guardrails on the edges. Have any leaves, snow, or ice cleared regularly. Use sand or salt on walking paths during winter. Clean up any spills in your garage right away. This includes oil or grease spills. What can I do in the bathroom? Use night lights. Install grab bars by the toilet and in the tub and shower. Do not use towel bars as grab bars. Use non-skid mats or decals in the tub or shower. If you need to sit down in the shower, use a plastic, non-slip stool. Keep the floor dry. Clean up any water that spills on the floor as soon as it happens. Remove soap buildup in the tub or shower regularly. Attach bath mats securely with double-sided non-slip rug tape. Do not have throw rugs and other things on the  floor that can make you trip. What can I do in the bedroom? Use night lights. Make sure that you have a light by your bed that is easy to reach. Do not use any sheets or blankets that are too big for your bed. They should not hang down onto the floor. Have a firm chair that has side arms. You can use this for support while you get dressed. Do not have throw rugs and other things on the floor that can make you trip. What can I do in the kitchen? Clean up any spills right away. Avoid walking on wet floors. Keep items that you use a lot in easy-to-reach places. If you need to reach something above you, use a strong step stool that has a grab bar. Keep electrical cords out of the way. Do not use floor polish or wax that makes floors slippery. If you must use wax, use non-skid floor wax. Do not have throw rugs and other things on the floor that can make you trip. What can I do with my stairs? Do not leave any items on the stairs. Make sure that there are handrails on both sides of the stairs and use them. Fix handrails that are broken or loose. Make sure that handrails are as long as the stairways. Check any carpeting to make sure that it is firmly attached to the stairs. Fix any carpet that is  loose or worn. Avoid having throw rugs at the top or bottom of the stairs. If you do have throw rugs, attach them to the floor with carpet tape. Make sure that you have a light switch at the top of the stairs and the bottom of the stairs. If you do not have them, ask someone to add them for you. What else can I do to help prevent falls? Wear shoes that: Do not have high heels. Have rubber bottoms. Are comfortable and fit you well. Are closed at the toe. Do not wear sandals. If you use a stepladder: Make sure that it is fully opened. Do not climb a closed stepladder. Make sure that both sides of the stepladder are locked into place. Ask someone to hold it for you, if possible. Clearly mark and make  sure that you can see: Any grab bars or handrails. First and last steps. Where the edge of each step is. Use tools that help you move around (mobility aids) if they are needed. These include: Canes. Walkers. Scooters. Crutches. Turn on the lights when you go into a dark area. Replace any light bulbs as soon as they burn out. Set up your furniture so you have a clear path. Avoid moving your furniture around. If any of your floors are uneven, fix them. If there are any pets around you, be aware of where they are. Review your medicines with your doctor. Some medicines can make you feel dizzy. This can increase your chance of falling. Ask your doctor what other things that you can do to help prevent falls. This information is not intended to replace advice given to you by your health care provider. Make sure you discuss any questions you have with your health care provider. Document Released: 06/24/2009 Document Revised: 02/03/2016 Document Reviewed: 10/02/2014 Elsevier Interactive Patient Education  2017 [employer].

## 2022-08-16 ENCOUNTER — Telehealth: Payer: Self-pay

## 2022-08-16 DIAGNOSIS — E1165 Type 2 diabetes mellitus with hyperglycemia: Secondary | ICD-10-CM

## 2022-08-16 DIAGNOSIS — I10 Essential (primary) hypertension: Secondary | ICD-10-CM

## 2022-08-16 MED ORDER — TIRZEPATIDE 2.5 MG/0.5ML ~~LOC~~ SOAJ: 2.5000 mg | SUBCUTANEOUS | 0 refills | Status: DC

## 2022-08-16 MED ORDER — LOSARTAN POTASSIUM 100 MG PO TABS: 100.0000 mg | ORAL_TABLET | Freq: Every day | ORAL | 1 refills | Status: DC

## 2022-08-16 MED ORDER — HYDROCHLOROTHIAZIDE 25 MG PO TABS: 25.0000 mg | ORAL_TABLET | Freq: Every day | ORAL | 1 refills | Status: DC

## 2022-08-16 NOTE — Telephone Encounter (Signed)
1.) Patient called stating she needs a refill on her Losartan and HCTZ, request fulfilled!  2.) Patient also stated that [facility] has a Tree surgeon and she needs an alternative sent in   [place] please advise on the request # 2

## 2022-08-16 NOTE — Telephone Encounter (Signed)
-   discontinue semaglutide as requested due to nationwide shortage.  - start on Mounjaro inject 2.5 mg into skin once a week.  Please schedule follow up appointment to evaluate blood sugars in 1 months then will increase mounjaro dosage if needed.   -Notify [place] office provider for side effects nausea ,vomiting,diarrhea/constipation ,decreased appetite,abdominal pain or swelling on the neck. - monitor blood sugars and notify provider if low blood sugars noted < 75

## 2022-08-16 NOTE — Telephone Encounter (Signed)
Spoke with patient and discussed Dinah's response. Appointment Scheduled for early January to follow-up on blood sugars.

## 2022-08-21 ENCOUNTER — Ambulatory Visit (INDEPENDENT_AMBULATORY_CARE_PROVIDER_SITE_OTHER): Payer: Medicare Other | Admitting: Podiatry

## 2022-08-21 ENCOUNTER — Encounter: Payer: Self-pay | Admitting: Podiatry

## 2022-08-21 VITALS — BP 153/75

## 2022-08-21 DIAGNOSIS — M79674 Pain in right toe(s): Secondary | ICD-10-CM | POA: Diagnosis not present

## 2022-08-21 DIAGNOSIS — E1165 Type 2 diabetes mellitus with hyperglycemia: Secondary | ICD-10-CM | POA: Diagnosis not present

## 2022-08-21 DIAGNOSIS — M79675 Pain in left toe(s): Secondary | ICD-10-CM | POA: Diagnosis not present

## 2022-08-21 DIAGNOSIS — L600 Ingrowing nail: Secondary | ICD-10-CM | POA: Diagnosis not present

## 2022-08-21 DIAGNOSIS — B351 Tinea unguium: Secondary | ICD-10-CM | POA: Diagnosis not present

## 2022-08-21 NOTE — Progress Notes (Signed)
  Subjective:  Patient ID: Wendy Obrien, female    DOB: Jan 01, 1951,  MRN: [id number]  Wendy Obrien presents to clinic today for preventative diabetic foot care and painful elongated mycotic toenails 1-5 bilaterally which are tender when wearing enclosed shoe gear. Pain is relieved with periodic professional debridement.  Chief Complaint  Patient presents with   Nail Problem    DFC BS-125 A1C=6.0 PCP-Ngetich PCP VST- 2 weeks ago   New problem(s): None.   PCP is Ngetich, Nelda Bucks, NP.  Allergies  Allergen Reactions   Bee Venom Anaphylaxis   Beeswax Anaphylaxis    bees   Fish Allergy Anaphylaxis   Shellfish Allergy Anaphylaxis    And all seafood   Aspirin Other (See Comments)    G6P Deficiency   Eggs Or Egg-Derived Products Hives   Nsaids Other (See Comments)    G6P Deficiency   Sulfa Antibiotics Other (See Comments)    G6P Deficiency     Review of Systems: Negative except as noted in the HPI.  Objective: No changes noted in today's physical examination. Vitals:   08/21/22 0838  BP: (!) 153/75   Wendy Obrien is a pleasant 71 y.o. female WD, WN in NAD. AAO x 3. Vascular Examination: CFT <3 seconds b/l LE. Palpable DP pulse(s) b/l LE. Palpable PT pulse(s) b/l LE. Pedal hair sparse. No pain with calf compression b/l. Lower extremity skin temperature gradient within normal limits. No edema noted b/l LE. No ischemia or gangrene noted b/l LE. No cyanosis or clubbing noted b/l LE.  Dermatological Examination: No open wounds b/l LE. No interdigital macerations noted b/l LE.   Incurvated loose nailplate lateral border right hallux.  Nail border hypertrophy absent. There is tenderness to palpation. Sign(s) of infection: no clinical signs of infection noted on examination today.   No hyperkeratotic nor porokeratotic lesions present on today's visit.   Toenails 1-5 bilaterally with white, chalky residue overlying all toenails consistent with superficial white  onychomycosis.  Neurological Examination: Protective sensation intact 5/5 intact bilaterally with 10g monofilament b/l. Vibratory sensation intact b/l.  Musculoskeletal Examination: Muscle strength 5/5 to all lower extremity muscle groups bilaterally. Pes planus deformity noted bilateral LE. Patient ambulates independent of any assistive aids.  Assessment/Plan: 1. Pain due to onychomycosis of toenails of both feet   2. Ingrown toenail without infection   3. Type 2 diabetes mellitus with hyperglycemia, without long-term current use of insulin (HCC)     No orders of the defined types were placed in this encounter.   -Patient was evaluated and treated. All patient's and/or POA's questions/concerns answered on today's visit. -Continue supportive shoe gear daily. -Toenails were debrided in length and girth 1-5 bilaterally with sterile nail nippers and dremel without iatrogenic bleeding. Continue Vick's Vapor Rub to affected toenail(s) once daily. -Loose nailplate lateral border of R hallux gently debrided to level of adherence. Digit cleansed with alcohol. triple antibiotic ointment applied to nailbed followed by light dressing. Patient instructed to apply triple antibiotic ointment to right great toe once daily for 7 days. -Patient/POA to call should there be question/concern in the interim.   Return in about 3 months (around 11/20/2022).  Marzetta Board, DPM

## 2022-08-26 DIAGNOSIS — E1169 Type 2 diabetes mellitus with other specified complication: Secondary | ICD-10-CM | POA: Diagnosis not present

## 2022-09-11 ENCOUNTER — Other Ambulatory Visit: Payer: Self-pay | Admitting: Family

## 2022-09-11 DIAGNOSIS — E1165 Type 2 diabetes mellitus with hyperglycemia: Secondary | ICD-10-CM

## 2022-09-19 ENCOUNTER — Ambulatory Visit: Payer: Medicare Other | Admitting: Family

## 2022-09-26 DIAGNOSIS — E1169 Type 2 diabetes mellitus with other specified complication: Secondary | ICD-10-CM | POA: Diagnosis not present

## 2022-09-29 NOTE — Telephone Encounter (Signed)
Message sent to Ngetich, Nelda Bucks, NP to reply back to patient

## 2022-10-03 ENCOUNTER — Telehealth (INDEPENDENT_AMBULATORY_CARE_PROVIDER_SITE_OTHER): Payer: 59 | Admitting: Family

## 2022-10-03 ENCOUNTER — Encounter: Payer: Self-pay | Admitting: Family

## 2022-10-03 DIAGNOSIS — E1165 Type 2 diabetes mellitus with hyperglycemia: Secondary | ICD-10-CM

## 2022-10-03 NOTE — Progress Notes (Signed)
This service is provided via telemedicine  No vital signs collected/recorded due to the encounter was a telemedicine visit.   Location of patient (ex: home, work):  Home  Patient consents to a telephone visit:  Yes  Location of the provider (ex: office, home):  [employer].  Name of any referring provider:  Jarryd Gratz, Nelda Bucks, NP   Names of all persons participating in the telemedicine service and their role in the encounter:  Patient, Wendy Obrien, [place], [place], Webb Silversmith, NP.    Time spent on call:  8 minutes spent on the phone with Medical Assistant.      Provider: Marlowe Sax FNP-C  Tuyet Bader, Nelda Bucks, NP  Patient Care Team: Sueellen Kayes, Nelda Bucks, NP as PCP - General (Family Medicine) Nahser, Wonda Cheng, MD as PCP - Cardiology (Cardiology) Tat, Eustace Quail, DO as Consulting Physician (Neurology)  Extended Emergency Contact Information Primary Emergency Contact: Wendy Obrien Address: [address] [place], [place] [address] [name] of [place] Phone: [phone] Mobile Phone: [phone] Relation: Spouse Secondary Emergency Contact: [place] Mobile Phone: [phone] Relation: Other  Code Status:  Full Code  Goals of care: Advanced Directive information    10/03/2022    2:25 PM  Advanced Directives  Does Patient Have a Medical Advance Directive? No  Would patient like information on creating a medical advance directive? No - Patient declined     Chief Complaint  Patient presents with   Acute Visit    Patient wants to discuss medication Lantus.     HPI:  Pt is a 72 y.o. female seen today for an acute visit to discuss medication . States bought mounjaro but did not use after reading the side effects.  Her Free style Libre readings indicated CBG in the 60's -90's.Sensor woke her up in the morning around 2 -4 am indicating low blood sugars.states did not have any hypoglycemia symptoms.Just felt fine.Her phone   indicated sensor due to be changed in 3 days but when she checked her sensor it stated due for change now.she applied a new sensor and blood sugars were at her baseline. 75 -168.she reduced her Lantus from 29 units to 25 units daily.    Past Medical History:  Diagnosis Date   Arthritis    CHF (congestive heart failure) (HCC)    DM (diabetes mellitus) ([facility])    Essential tremor    head   G6PD deficiency    Glaucoma    Per new patient form   High cholesterol    Per new patient form   HTN (hypertension)    Hyperlipidemia    Past Surgical History:  Procedure Laterality Date   ABDOMINAL HYSTERECTOMY     Dr. Owens Shark / Per new patient form   ANKLE SURGERY Right    x 2   BREAST LUMPECTOMY Left    benign   DG  BONE DENSITY ([place] [facility]X)     Per new patient form   DIAGNOSTIC MAMMOGRAM     Per new patient form   EYE SURGERY Left    age 69 - L eye removed, Dr. Drenda Freeze / Per new patient form   MINOR RELEASE DORSAL COMPARTMENT (DEQUERVAINS) Left 05/09/2022   Procedure: Left dequervains release and left dorsal forearm mass excision;  Surgeon: Orene Desanctis, MD;  Location: [facility];  Service: Orthopedics;  Laterality: Left;  Regional block   REDUCTION MAMMAPLASTY Bilateral    1998  Allergies  Allergen Reactions   Bee Venom Anaphylaxis   Beeswax Anaphylaxis    bees   Fish Allergy Anaphylaxis   Shellfish Allergy Anaphylaxis    And all seafood   Aspirin Other (See Comments)    G6P Deficiency   Eggs Or Egg-Derived Products Hives   Nsaids Other (See Comments)    G6P Deficiency   Sulfa Antibiotics Other (See Comments)    G6P Deficiency     Outpatient Encounter Medications as of 10/03/2022  Medication Sig   acetaminophen (TYLENOL) 500 MG tablet Take 2 tablets by mouth every 6 (six) hours as needed.   atorvastatin (LIPITOR) 40 MG tablet TAKE 1 TABLET BY MOUTH EVERYDAY AT BEDTIME   B-D ULTRAFINE III SHORT PEN 31G X 8 MM MISC SMARTSIG:injection Twice Daily   BETA  CAROTENE PO Take 1 tablet by mouth daily with breakfast.   BIOTIN PO Take by mouth daily.   busPIRone (BUSPAR) 7.5 MG tablet Take 7.5 mg by mouth daily with breakfast.   hydrochlorothiazide (HYDRODIURIL) 25 MG tablet Take 1 tablet (25 mg total) by mouth daily with breakfast.   latanoprost (XALATAN) 0.005 % ophthalmic solution Place 1 drop into the right eye at bedtime.   losartan (COZAAR) 100 MG tablet Take 1 tablet (100 mg total) by mouth daily with breakfast.   MAXITROL 3.5-10000-0.1 OINT Place into the right eye as needed. Apply to eye.   Misc Natural Products (NEURIVA) CAPS Take 1 capsule by mouth daily with breakfast.   Multiple Vitamin (MULTIVITAMIN WITH MINERALS) TABS tablet Take 1 tablet by mouth daily with breakfast.   Multiple Vitamins-Minerals (HAIR/SKIN/NAILS/BIOTIN) TABS Take 1 tablet by mouth daily with breakfast.   Omega-3 Fatty Acids (FISH OIL) 1200 MG CAPS Take 1,200 mg by mouth daily with breakfast.   propranolol ER (INDERAL LA) 120 MG 24 hr capsule TAKE 1 CAPSULE (120 MG TOTAL) BY MOUTH AT BEDTIME.   SODIUM FLUORIDE, DENTAL RINSE, 0.2 % SOLN Place 1 application onto teeth daily as needed (to strenghten teeth).   timolol (TIMOPTIC) 0.5 % ophthalmic solution Place 1 drop into the right eye every morning.   tirzepatide [facility]) 2.5 MG/0.5ML Pen Inject 2.5 mg into the skin once a week.   traZODone (DESYREL) 50 MG tablet Take 50 mg by mouth at bedtime as needed for sleep.   insulin glargine (LANTUS) 100 unit/mL SOPN Inject 29 Units into the skin in the morning and at bedtime.   No facility-administered encounter medications on file as of 10/03/2022.    Review of Systems  Constitutional:  Negative for appetite change, chills, fatigue, fever and unexpected weight change.  HENT:  Positive for hearing loss.   Eyes:  Negative for pain, discharge, redness, itching and visual disturbance.  Respiratory:  Negative for cough, chest tightness, shortness of breath and wheezing.    Cardiovascular:  Negative for chest pain, palpitations and leg swelling.  Gastrointestinal:  Negative for abdominal distention, abdominal pain, nausea and vomiting.  Endocrine: Negative for cold intolerance, heat intolerance, polydipsia, polyphagia and polyuria.  Neurological:  Negative for dizziness, weakness, light-headedness and headaches.    Immunization History  Administered Date(s) Administered   Influenza,trivalent, recombinat, inj, PF 08/07/2022   PFIZER(Purple Top)SARS-COV-2 Vaccination 09/14/2019, 10/22/2019, 06/26/2020, 01/19/2021, 06/21/2021, 06/14/2022   PNEUMOCOCCAL CONJUGATE-20 01/31/2022   Pneumococcal Polysaccharide-23 06/11/2020   Respiratory Syncytial Virus Vaccine,Recomb Aduvanted(Arexvy) 06/15/2022   Zoster Recombinat (Shingrix) 09/14/2017, 12/03/2017   Pertinent  Health Maintenance Due  Topic Date Due   COLONOSCOPY (Pts 45-70yr Insurance coverage will need to  be confirmed)  Never done   FOOT EXAM  12/01/2022   HEMOGLOBIN A1C  02/05/2023   OPHTHALMOLOGY EXAM  03/12/2023   MAMMOGRAM  11/24/2023   INFLUENZA VACCINE  Completed   DEXA SCAN  Completed      12/02/2021   10:03 AM 01/31/2022    8:33 AM 08/07/2022    8:45 AM 08/14/2022    8:55 AM 10/03/2022    2:25 PM  Fall Risk  Falls in the past year? 0 0 0 0 0  Was there an injury with Fall? 0 0 0 0 0  Fall Risk Category Calculator 0 0 0 0 0  Fall Risk Category (Retired) Low Low Low Low   (RETIRED) Patient Fall Risk Level Low fall risk Low fall risk Low fall risk Low fall risk   Patient at Risk for Falls Due to No Fall Risks No Fall Risks No Fall Risks No Fall Risks No Fall Risks  Fall risk Follow up Falls evaluation completed Falls evaluation completed Falls evaluation completed Falls evaluation completed Falls evaluation completed   Functional Status Survey:    There were no vitals filed for this visit. There is no height or weight on file to calculate BMI. Physical Exam Constitutional:      General:  She is not in acute distress.    Appearance: She is not ill-appearing.  Pulmonary:     Effort: No respiratory distress.  Neurological:     Mental Status: She is alert and oriented to person, place, and time.     Gait: Gait normal.  Psychiatric:        Mood and Affect: Mood normal.        Behavior: Behavior normal.     Labs reviewed: Recent Labs    10/06/21 0413 01/31/22 0918 05/09/22 0640 08/07/22 0937  NA 133* 141 144 140  K 3.6 4.3 3.9 3.9  CL 102 105 103 105  CO2 20* 30  --  25  GLUCOSE 246* 131* 139* 144*  BUN '20 13 12 11  '$ CREATININE 1.16* 0.77 0.70 0.71  CALCIUM 8.9 9.8  --  9.9   Recent Labs    10/03/21 1648 01/31/22 0918 08/07/22 0937  AST 44* 22 24  ALT 38 20 23  ALKPHOS 94  --   --   BILITOT 1.4* 0.8 0.5  PROT 7.6 6.9 7.1  ALBUMIN 4.3  --   --    Recent Labs    10/05/21 1002 10/05/21 2041 10/06/21 0413 01/31/22 0918 05/09/22 0640 08/07/22 0937  WBC 9.4   < > 8.4 6.8  --  7.6  NEUTROABS 5.4  --   --  3,305  --  3,800  HGB 15.6*   < > 13.3 14.0 13.6 14.0  HCT 43.9   < > 38.4 42.4 40.0 41.7  MCV 91.8   < > 92.3 97.0  --  97.2  PLT 205   < > 156 197  --  197   < > = values in this interval not displayed.   Lab Results  Component Value Date   TSH 5.54 (H) 08/07/2022   Lab Results  Component Value Date   HGBA1C 6.2 (H) 08/07/2022   Lab Results  Component Value Date   CHOL 121 08/07/2022   HDL 46 (L) 08/07/2022   LDLCALC 50 08/07/2022   TRIG 174 (H) 08/07/2022   CHOLHDL 2.6 08/07/2022    Significant Diagnostic Results in last 30 days:  No results found.  Assessment/Plan  1.  Type 2 diabetes mellitus with hyperglycemia, without long-term current use of insulin (HCC) Lab Results  Component Value Date   HGBA1C 6.2 (H) 08/07/2022  Her Free style libre showed CBG readings in the 60's -90's and woke her up several times during the early morning hours indicating hypoglycemic.realized sensor was due for change.Blood sugars were at baseline  after changing her sensor.No symptoms of hypo/hyperglycemia. Has reduced Lantus from 29 units to 25 units. - bought Mounjaro but states will not use it after reading side effects.  - advised to continue to monitor CBG and notify provider for any CBG < 75 or > 200  - continue on dietary modification  Family/ staff Communication: Reviewed plan of care with patient verbalized understanding  Labs/tests ordered: None   Next Appointment: Return if symptoms worsen or fail to improve.  I connected with  Wendy Obrien on 10/03/22 by a video enabled telemedicine application and verified that I am speaking with the correct person using two identifiers.   I discussed the limitations of evaluation and management by telemedicine. The patient expressed understanding and agreed to proceed.   Spent 12 minutes of face to face with patient  >50% time spent counseling; reviewing medical record;  labs; and developing future plan of care.   Sandrea Hughs, NP

## 2022-10-06 ENCOUNTER — Other Ambulatory Visit: Payer: Self-pay

## 2022-10-06 MED ORDER — BD PEN NEEDLE SHORT U/F 31G X 8 MM MISC: 1 refills | Status: DC

## 2022-10-10 ENCOUNTER — Other Ambulatory Visit: Payer: Self-pay | Admitting: Family

## 2022-10-10 ENCOUNTER — Telehealth: Payer: Self-pay

## 2022-10-10 DIAGNOSIS — Z91030 Bee allergy status: Secondary | ICD-10-CM

## 2022-10-10 DIAGNOSIS — L989 Disorder of the skin and subcutaneous tissue, unspecified: Secondary | ICD-10-CM

## 2022-10-10 MED ORDER — EPINEPHRINE 0.3 MG/0.3ML IJ SOAJ: 0.3000 mg | INTRAMUSCULAR | 11 refills | Status: DC | PRN

## 2022-10-10 NOTE — Progress Notes (Signed)
Dermatology referral ordered for evaluation of spots on the face.

## 2022-10-10 NOTE — Telephone Encounter (Signed)
Incoming fax received from [facility] requesting a rx for an epi-pen. Epi-pen is not on active medication list, please advise  RX is pending, if provider is in agreement with request

## 2022-10-10 NOTE — Telephone Encounter (Signed)
Okay to fill Epi-pen for bee venom anaphylaxis reaction.

## 2022-10-11 ENCOUNTER — Encounter: Payer: Self-pay | Admitting: Family

## 2022-10-11 ENCOUNTER — Telehealth (INDEPENDENT_AMBULATORY_CARE_PROVIDER_SITE_OTHER): Payer: 59 | Admitting: Family

## 2022-10-11 DIAGNOSIS — R5081 Fever presenting with conditions classified elsewhere: Secondary | ICD-10-CM

## 2022-10-11 DIAGNOSIS — U071 COVID-19: Secondary | ICD-10-CM | POA: Diagnosis not present

## 2022-10-11 DIAGNOSIS — J069 Acute upper respiratory infection, unspecified: Secondary | ICD-10-CM

## 2022-10-11 MED ORDER — ZINC GLUCONATE 50 MG PO TABS: 50.0000 mg | ORAL_TABLET | Freq: Every day | ORAL | 0 refills | Status: AC

## 2022-10-11 MED ORDER — NIRMATRELVIR/RITONAVIR (PAXLOVID)TABLET: 3.0000 | ORAL_TABLET | Freq: Two times a day (BID) | ORAL | 0 refills | Status: AC

## 2022-10-11 MED ORDER — BENZONATATE 100 MG PO CAPS: 100.0000 mg | ORAL_CAPSULE | Freq: Two times a day (BID) | ORAL | 0 refills | Status: DC | PRN

## 2022-10-11 MED ORDER — VITAMIN C 250 MG PO TABS: 250.0000 mg | ORAL_TABLET | Freq: Every day | ORAL | 0 refills | Status: AC

## 2022-10-11 NOTE — Patient Instructions (Addendum)
Hold Atorvastatin 40 mg tablet for 5 days then restart after completing Paxlovid  - Take Zinc 50 mg tablet one by mouth daily for 14 days  - Take Vitamin C 250 mg tablet one by mouth daily x 14 days - continue on vitamin D supplement  - Continue with Tylenol 500 mg tablet one by mouth every 8 hrs as needed for fever,chills or headache.  body aches  - Take Tessalon 100 mg capsule one by mouth twice daily as needed for cough  - increase your fruits intake in your diet  - increase your water intake to 6-8 glasses of water daily  - Notify provider or go to ED if you develop any chest tightness,chest pain or shortness of breath

## 2022-10-11 NOTE — Progress Notes (Signed)
This service is provided via telemedicine  No vital signs collected/recorded due to the encounter was a telemedicine visit.   Location of patient (ex: home, work):  Home  Patient consents to a telephone visit:  Yes  Location of the provider (ex: office, home):  [employer]  Name of any referring provider:  Lavene Obrien, Wendy Bucks, NP   Names of all persons participating in the telemedicine service and their role in the encounter:  Patient, Wendy Obrien, Wendy Obrien, Wendy Obrien, [place], NP.    Time spent on call:  8 minutes spent on the phone with Medical Assistant.      Location:      Place of Service:    Provider: Verlena Marlette FNP-C  Wendy Obrien, Wendy Bucks, NP  Patient Care Team: Wendy Obrien, Wendy Bucks, NP as PCP - General (Family Medicine) Wendy Obrien, Wendy Cheng, MD as PCP - Cardiology (Cardiology) Wendy Obrien, Wendy Quail, DO as Consulting Physician (Neurology)  Extended Emergency Contact Information Primary Emergency Contact: Wendy Obrien,Wendy Obrien Address: [address] [place], [place] [address] Wendy Obrien of [place] Phone: [phone] Mobile Phone: [phone] Relation: Spouse Secondary Emergency Contact: Wendy Obrien Mobile Phone: [phone] Relation: Other  Code Status:  DNR Goals of care: Advanced Directive information    10/11/2022    1:37 PM  Advanced Directives  Does Patient Have a Medical Advance Directive? No  Would patient like information on creating a medical advance directive? No - Patient declined     Chief Complaint  Patient presents with   Acute Visit    Patient complains of being Covid Positive and wanting Paxlovid. Patient symptoms are runny nose, fatigue, fever, and cough that started on Monday 10/09/2022    HPI:  Pt is a 72 y.o. female seen today for an acute visit for evaluation of COVID-19 positive 10/11/2022.states son was not feeling well on Sunday 10/08/2022 had come to visit her.son called today that he tested positive for  COVID-19.Husband also positive for COVID-19.  Temp was 101 last night she took a tylenol.Also has headache,fatigue,cough,body aches and decreased appetite.had soup for lunch today.   She complains of cough,fever,chills,fatigue,generalized body aches that started on Monday 10/09/2022.  She denies any chest tightness,chest pain,palpitation or shortness of breath.    Past Medical History:  Diagnosis Date   Arthritis    CHF (congestive heart failure) (HCC)    DM (diabetes mellitus) ([place])    Essential tremor    head   G6PD deficiency    Glaucoma    Per new patient form   High cholesterol    Per new patient form   HTN (hypertension)    Hyperlipidemia    Past Surgical History:  Procedure Laterality Date   ABDOMINAL HYSTERECTOMY     Dr. Owens Shark / Per new patient form   ANKLE SURGERY Right    x 2   BREAST LUMPECTOMY Left    benign   DG  BONE DENSITY ([place] HX)     Per new patient form   DIAGNOSTIC MAMMOGRAM     Per new patient form   EYE SURGERY Left    age 37 - L eye removed, Dr. Drenda Obrien / Per new patient form   MINOR RELEASE DORSAL COMPARTMENT (DEQUERVAINS) Left 05/09/2022   Procedure: Left dequervains release and left dorsal forearm mass excision;  Surgeon: Wendy Desanctis, MD;  Location: [facility];  Service: Orthopedics;  Laterality: Left;  Regional block   REDUCTION MAMMAPLASTY Bilateral  1998    Allergies  Allergen Reactions   Bee Venom Anaphylaxis   Beeswax Anaphylaxis    bees   Fish Allergy Anaphylaxis   Shellfish Allergy Anaphylaxis    And all seafood   Aspirin Other (See Comments)    G6P Deficiency   Eggs Or Egg-Derived Products Hives   Nsaids Other (See Comments)    G6P Deficiency   Sulfa Antibiotics Other (See Comments)    G6P Deficiency     Outpatient Encounter Medications as of 10/11/2022  Medication Sig   acetaminophen (TYLENOL) 500 MG tablet Take 2 tablets by mouth every 6 (six) hours as needed.   atorvastatin (LIPITOR) 40 MG tablet  TAKE 1 TABLET BY MOUTH EVERYDAY AT BEDTIME   B-D ULTRAFINE III SHORT PEN 31G X 8 MM MISC SMARTSIG:injection Twice Daily DX: E11.65   BETA CAROTENE PO Take 1 tablet by mouth daily with breakfast.   BIOTIN PO Take by mouth daily.   busPIRone (BUSPAR) 7.5 MG tablet Take 7.5 mg by mouth daily with breakfast.   EPINEPHrine 0.3 mg/0.3 mL IJ SOAJ injection Inject 0.3 mg into the muscle as needed for anaphylaxis.   hydrochlorothiazide (HYDRODIURIL) 25 MG tablet Take 1 tablet (25 mg total) by mouth daily with breakfast.   insulin glargine (LANTUS) 100 unit/mL SOPN Inject 29 Units into the skin in the morning and at bedtime.   latanoprost (XALATAN) 0.005 % ophthalmic solution Place 1 drop into the right eye at bedtime.   losartan (COZAAR) 100 MG tablet Take 1 tablet (100 mg total) by mouth daily with breakfast.   MAXITROL 3.5-10000-0.1 OINT Place into the right eye as needed. Apply to eye.   Misc Natural Products (NEURIVA) CAPS Take 1 capsule by mouth daily with breakfast.   Multiple Vitamin (MULTIVITAMIN WITH MINERALS) TABS tablet Take 1 tablet by mouth daily with breakfast.   Multiple Vitamins-Minerals (HAIR/SKIN/NAILS/BIOTIN) TABS Take 1 tablet by mouth daily with breakfast.   Omega-3 Fatty Acids (FISH OIL) 1200 MG CAPS Take 1,200 mg by mouth daily with breakfast.   propranolol ER (INDERAL LA) 120 MG 24 hr capsule TAKE 1 CAPSULE (120 MG TOTAL) BY MOUTH AT BEDTIME.   SODIUM FLUORIDE, DENTAL RINSE, 0.2 % SOLN Place 1 application onto teeth daily as needed (to strenghten teeth).   timolol (TIMOPTIC) 0.5 % ophthalmic solution Place 1 drop into the right eye every morning.   traZODone (DESYREL) 50 MG tablet Take 50 mg by mouth at bedtime as needed for sleep.   [DISCONTINUED] tirzepatide [facility]) 2.5 MG/0.5ML Pen Inject 2.5 mg into the skin once a week.   No facility-administered encounter medications on file as of 10/11/2022.    Review of Systems  Constitutional:  Positive for appetite change, fatigue  and fever. Negative for chills and unexpected weight change.  HENT:  Positive for rhinorrhea. Negative for congestion, ear discharge, ear pain, hearing loss, postnasal drip, sinus pressure, sinus pain, sneezing, sore throat, tinnitus and trouble swallowing.   Eyes:  Negative for pain, discharge, redness, itching and visual disturbance.  Respiratory:  Positive for cough. Negative for chest tightness, shortness of breath and wheezing.   Cardiovascular:  Negative for chest pain, palpitations and leg swelling.  Gastrointestinal:  Negative for abdominal distention, abdominal pain, constipation, diarrhea, nausea and vomiting.  Skin:  Negative for color change, pallor and rash.  Neurological:  Positive for headaches. Negative for dizziness and light-headedness.    Immunization History  Administered Date(s) Administered   Influenza,trivalent, recombinat, inj, PF 08/07/2022   PFIZER(Purple Top)SARS-COV-2  Vaccination 09/14/2019, 10/22/2019, 06/26/2020, 01/19/2021, 06/21/2021, 06/14/2022   PNEUMOCOCCAL CONJUGATE-20 01/31/2022   Pneumococcal Polysaccharide-23 06/11/2020   Respiratory Syncytial Virus Vaccine,Recomb Aduvanted(Arexvy) 06/15/2022   Zoster Recombinat (Shingrix) 09/14/2017, 12/03/2017   Pertinent  Health Maintenance Due  Topic Date Due   COLONOSCOPY (Pts 45-24yr Insurance coverage will need to be confirmed)  Never done   FOOT EXAM  12/01/2022   HEMOGLOBIN A1C  02/05/2023   OPHTHALMOLOGY EXAM  03/12/2023   MAMMOGRAM  11/24/2023   INFLUENZA VACCINE  Completed   DEXA SCAN  Completed      01/31/2022    8:33 AM 08/07/2022    8:45 AM 08/14/2022    8:55 AM 10/03/2022    2:25 PM 10/11/2022    1:37 PM  Fall Risk  Falls in the past year? 0 0 0 0 0  Was there an injury with Fall? 0 0 0 0 0  Fall Risk Category Calculator 0 0 0 0 0  Fall Risk Category (Retired) Low Low Low    (RETIRED) Patient Fall Risk Level Low fall risk Low fall risk Low fall risk    Patient at Risk for Falls Due to No  Fall Risks No Fall Risks No Fall Risks No Fall Risks No Fall Risks  Fall risk Follow up Falls evaluation completed Falls evaluation completed Falls evaluation completed Falls evaluation completed Falls evaluation completed   Functional Status Survey:    There were no vitals filed for this visit. There is no height or weight on file to calculate BMI. Physical Exam Connected on video but patient unable to turn on her video camera.   Labs reviewed: Recent Labs    01/31/22 0918 05/09/22 0640 08/07/22 0937  NA 141 144 140  K 4.3 3.9 3.9  CL 105 103 105  CO2 30  --  25  GLUCOSE 131* 139* 144*  BUN '13 12 11  '$ CREATININE 0.77 0.70 0.71  CALCIUM 9.8  --  9.9   Recent Labs    01/31/22 0918 08/07/22 0937  AST 22 24  ALT 20 23  BILITOT 0.8 0.5  PROT 6.9 7.1   Recent Labs    01/31/22 0918 05/09/22 0640 08/07/22 0937  WBC 6.8  --  7.6  NEUTROABS 3,305  --  3,800  HGB 14.0 13.6 14.0  HCT 42.4 40.0 41.7  MCV 97.0  --  97.2  PLT 197  --  197   Lab Results  Component Value Date   TSH 5.54 (H) 08/07/2022   Lab Results  Component Value Date   HGBA1C 6.2 (H) 08/07/2022   Lab Results  Component Value Date   CHOL 121 08/07/2022   HDL 46 (L) 08/07/2022   LDLCALC 50 08/07/2022   TRIG 174 (H) 08/07/2022   CHOLHDL 2.6 08/07/2022    Significant Diagnostic Results in last 30 days:  No results found.  Assessment/Plan  1. Positive self-administered antigen test for COVID-19 Tested positive for COVID-19 with home kit today 10/11/2022 Requested Paxlovid/ - advised to hold Atorvastatin for 5 days then restart after completing Paxlovid.  - nirmatrelvir/ritonavir (PAXLOVID) 20 x 150 MG & 10 x '100MG'$  TABS; Take 3 tablets by mouth 2 (two) times daily for 5 days. (Take nirmatrelvir 150 mg two tablets twice daily for 5 days and ritonavir 100 mg one tablet twice daily for 5 days) Patient GFR is 90  Dispense: 30 tablet; Refill: 0 - zinc gluconate 50 MG tablet; Take 1 tablet (50 mg total)  by mouth daily for 14 days.  Dispense: 14 tablet; Refill: 0 - vitamin C (ASCORBIC ACID) 250 MG tablet; Take 1 tablet (250 mg total) by mouth daily for 14 days.  Dispense: 14 tablet; Refill: 0  2. Upper respiratory tract infection due to COVID-19 virus - continue on vit D supplement. - start on Zinc ,Vitamin C as below  - Tessalon for cough  - increase your fruits intake in your diet  - increase your water intake to 6-8 glasses of water daily  - Notify provider or go to ED if you develop any chest tightness,chest pain or shortness of breath  - zinc gluconate 50 MG tablet; Take 1 tablet (50 mg total) by mouth daily for 14 days.  Dispense: 14 tablet; Refill: 0 - vitamin C (ASCORBIC ACID) 250 MG tablet; Take 1 tablet (250 mg total) by mouth daily for 14 days.  Dispense: 14 tablet; Refill: 0 - benzonatate (TESSALON) 100 MG capsule; Take 1 capsule (100 mg total) by mouth 2 (two) times daily as needed for cough.  Dispense: 20 capsule; Refill: 0  3. Fever due to COVID-19 Latest temp 101 Continue with Tylenol 500 mg tablet one by mouth every 8 hrs as needed for fever,chills or headache.    Family/ staff Communication: Reviewed plan of care with patient verbalized understanding   Labs/tests ordered: None   Next Appointment: Return if symptoms worsen or fail to improve.   I connected with  Wendy Obrien on 10/11/22 by a video Audio ( patient unable to turn on her video) enabled telemedicine application and verified that I am speaking with the correct person using two identifiers.   I discussed the limitations of evaluation and management by telemedicine. The patient expressed understanding and agreed to proceed.  Spent 20 minutes of non-face to face video Audio with patient  >50% time spent counseling; reviewing medical record; tests; labs; and developing future plan of care.   Sandrea Hughs, NP

## 2022-10-23 ENCOUNTER — Telehealth: Payer: Self-pay

## 2022-10-23 NOTE — Telephone Encounter (Signed)
Mychart message sent to patient with Dinah's reply

## 2022-10-23 NOTE — Telephone Encounter (Signed)
Infection will increase the blood sugars rather than paxlvoid.If blood sugars continue to be high please schedule an in office appointment for evaluation.

## 2022-10-23 NOTE — Telephone Encounter (Signed)
Patient called stating her blood sugars are running higher and higher since taking Paxlovid and she questions if this is expected and what to do about it.   I offered patient a video or in person visit and she refused both and stated she feels like the video visit are ineffective. Patient states the process is counterproductive as the medical assistant calls, then it takes the provider forever to call and complete their part and they go over the exact same thing that the medical assistant goes over and ask even more questions and in the end she never feels that her concerns are fully addressed.  I apologized that patient has had a bad experience with video visit and again offered an in person appointment with Janett Billow at 1 pm and patient refused. Patient had asked if stress could cause elevated blood sugars and then said you know what " Never mind, I'll Google all my questions, don't worry about sending a message to the provider about any of this."   I informed patient that we are here if she needs [place].  Message will be sent to [place], Nelda Bucks, NP as a FYI.

## 2022-10-27 DIAGNOSIS — E1169 Type 2 diabetes mellitus with other specified complication: Secondary | ICD-10-CM | POA: Diagnosis not present

## 2022-11-03 ENCOUNTER — Other Ambulatory Visit: Payer: Self-pay | Admitting: Family

## 2022-11-03 DIAGNOSIS — G250 Essential tremor: Secondary | ICD-10-CM

## 2022-11-25 DIAGNOSIS — E1169 Type 2 diabetes mellitus with other specified complication: Secondary | ICD-10-CM | POA: Diagnosis not present

## 2022-12-20 ENCOUNTER — Ambulatory Visit: Payer: 59 | Admitting: Podiatry

## 2022-12-26 DIAGNOSIS — E1169 Type 2 diabetes mellitus with other specified complication: Secondary | ICD-10-CM | POA: Diagnosis not present

## 2023-01-16 DIAGNOSIS — H40021 Open angle with borderline findings, high risk, right eye: Secondary | ICD-10-CM | POA: Diagnosis not present

## 2023-01-16 DIAGNOSIS — H43811 Vitreous degeneration, right eye: Secondary | ICD-10-CM | POA: Diagnosis not present

## 2023-01-19 ENCOUNTER — Other Ambulatory Visit: Payer: Self-pay | Admitting: Family

## 2023-01-19 DIAGNOSIS — Z1231 Encounter for screening mammogram for malignant neoplasm of breast: Secondary | ICD-10-CM

## 2023-01-24 ENCOUNTER — Other Ambulatory Visit: Payer: Self-pay | Admitting: Family

## 2023-01-31 ENCOUNTER — Telehealth (INDEPENDENT_AMBULATORY_CARE_PROVIDER_SITE_OTHER): Payer: 59 | Admitting: Student

## 2023-01-31 ENCOUNTER — Telehealth: Payer: Self-pay | Admitting: *Deleted

## 2023-01-31 DIAGNOSIS — R300 Dysuria: Secondary | ICD-10-CM | POA: Diagnosis not present

## 2023-01-31 DIAGNOSIS — N3001 Acute cystitis with hematuria: Secondary | ICD-10-CM | POA: Diagnosis not present

## 2023-01-31 LAB — POCT URINALYSIS DIPSTICK
Bilirubin, UA: NEGATIVE
Glucose, UA: NEGATIVE
Ketones, UA: POSITIVE
Leukocytes, UA: NEGATIVE
Nitrite, UA: NEGATIVE
Protein, UA: NEGATIVE
Spec Grav, UA: 1.01 (ref 1.010–1.025)
Urobilinogen, UA: NEGATIVE E.U./dL — AB
pH, UA: 6.5 (ref 5.0–8.0)

## 2023-01-31 MED ORDER — NITROFURANTOIN MONOHYD MACRO 100 MG PO CAPS
100.0000 mg | ORAL_CAPSULE | Freq: Two times a day (BID) | ORAL | 0 refills | Status: AC
Start: 2023-01-31 — End: 2023-02-10

## 2023-01-31 NOTE — Progress Notes (Signed)
Location:  [facility]   Place of Service:   Video Visit  Provider: Massai Hankerson  Code Status: Full Goals of Care:     01/31/2023   12:47 PM  Advanced Directives  Does Patient Have a Medical Advance Directive? No  Does patient want to make changes to medical advance directive? No - Patient declined     Chief Complaint  Patient presents with   Acute Visit    Possible UTI. Urine Frequency, Burning and odor.     HPI: Patient is a 72 y.o. female seen today for an acute visit for concern for UTI. She hasn't had one for 4-5 years. She has frequent urination, odor, and burning. She has had a temperature between 99-100   Denies nausea, vomiting diarrhea.   She drinks 6-7 glass of water per day and diet cranberry juice.   [facility] [address] in [place].   Past Medical History:  Diagnosis Date   Arthritis    CHF (congestive heart failure) (HCC)    DM (diabetes mellitus) (HCC)    Essential tremor    head   G6PD deficiency    Glaucoma    Per new patient form   High cholesterol    Per new patient form   HTN (hypertension)    Hyperlipidemia     Past Surgical History:  Procedure Laterality Date   ABDOMINAL HYSTERECTOMY     Dr. Manson Passey / Per new patient form   ANKLE SURGERY Right    x 2   BREAST LUMPECTOMY Left    benign   DG  BONE DENSITY ([facility] HX)     Per new patient form   DIAGNOSTIC MAMMOGRAM     Per new patient form   EYE SURGERY Left    age 71 - L eye removed, Dr. Merilynn Finland / Per new patient form   MINOR RELEASE DORSAL COMPARTMENT (DEQUERVAINS) Left 05/09/2022   Procedure: Left dequervains release and left dorsal forearm mass excision;  Surgeon: Gomez Cleverly, MD;  Location: [facility];  Service: Orthopedics;  Laterality: Left;  Regional block   REDUCTION MAMMAPLASTY Bilateral    1998    Allergies  Allergen Reactions   Bee Venom Anaphylaxis   Beeswax Anaphylaxis    bees   Fish Allergy Anaphylaxis   Shellfish Allergy  Anaphylaxis    And all seafood   Aspirin Other (See Comments)    G6P Deficiency   Egg-Derived Products Hives   Nsaids Other (See Comments)    G6P Deficiency   Sulfa Antibiotics Other (See Comments)    G6P Deficiency     Outpatient Encounter Medications as of 01/31/2023  Medication Sig   acetaminophen (TYLENOL) 500 MG tablet Take 2 tablets by mouth every 6 (six) hours as needed.   atorvastatin (LIPITOR) 40 MG tablet TAKE 1 TABLET BY MOUTH EVERYDAY AT BEDTIME   B-D ULTRAFINE III SHORT PEN 31G X 8 MM MISC SMARTSIG:injection Twice Daily DX: E11.65   BETA CAROTENE PO Take 1 tablet by mouth daily with breakfast.   BIOTIN PO Take by mouth daily.   busPIRone (BUSPAR) 7.5 MG tablet Take 7.5 mg by mouth daily with breakfast.   EPINEPHrine 0.3 mg/0.3 mL IJ SOAJ injection Inject 0.3 mg into the muscle as needed for anaphylaxis.   hydrochlorothiazide (HYDRODIURIL) 25 MG tablet TAKE 1 TABLET (25 MG TOTAL) BY MOUTH DAILY WITH BREAKFAST.   insulin glargine (LANTUS SOLOSTAR) 100 UNIT/ML Solostar Pen INJECT 29 UNIT INTO THE SKIN IN THE  MORNING AND AT BEDTIME   latanoprost (XALATAN) 0.005 % ophthalmic solution Place 1 drop into the right eye at bedtime.   losartan (COZAAR) 100 MG tablet TAKE 1 TABLET BY MOUTH DAILY WITH BREAKFAST.   MAXITROL 3.5-10000-0.1 OINT Place into the right eye as needed. Apply to eye.   Misc Natural Products (NEURIVA) CAPS Take 1 capsule by mouth daily with breakfast.   Multiple Vitamin (MULTIVITAMIN WITH MINERALS) TABS tablet Take 1 tablet by mouth daily with breakfast.   Multiple Vitamins-Minerals (HAIR/SKIN/NAILS/BIOTIN) TABS Take 1 tablet by mouth daily with breakfast.   Omega-3 Fatty Acids (FISH OIL) 1200 MG CAPS Take 1,200 mg by mouth daily with breakfast.   propranolol ER (INDERAL LA) 120 MG 24 hr capsule TAKE 1 CAPSULE (120 MG TOTAL) BY MOUTH AT BEDTIME.   SODIUM FLUORIDE, DENTAL RINSE, 0.2 % SOLN Place 1 application onto teeth daily as needed (to strenghten teeth).    timolol (TIMOPTIC) 0.5 % ophthalmic solution Place 1 drop into the right eye every morning.   traZODone (DESYREL) 50 MG tablet Take 50 mg by mouth at bedtime as needed for sleep.   [DISCONTINUED] benzonatate (TESSALON) 100 MG capsule Take 1 capsule (100 mg total) by mouth 2 (two) times daily as needed for cough.   No facility-administered encounter medications on file as of 01/31/2023.    Review of Systems:  Review of Systems  Health Maintenance  Topic Date Due   COLONOSCOPY (Pts 45-36yrs Insurance coverage will need to be confirmed)  Never done   FOOT EXAM  12/01/2022   COVID-19 Vaccine (8 - 2023-24 season) 01/23/2023   HEMOGLOBIN A1C  02/05/2023   OPHTHALMOLOGY EXAM  03/12/2023   INFLUENZA VACCINE  04/12/2023   Diabetic kidney evaluation - eGFR measurement  08/08/2023   Diabetic kidney evaluation - Urine ACR  08/08/2023   Medicare Annual Wellness (AWV)  08/15/2023   MAMMOGRAM  11/24/2023   DTaP/Tdap/Td (3 - Td or Tdap) 11/27/2032   Pneumonia Vaccine 43+ Years old  Completed   DEXA SCAN  Completed   Hepatitis C Screening  Completed   Zoster Vaccines- Shingrix  Completed   HPV VACCINES  Aged Out    Physical Exam: There were no vitals filed for this visit. There is no height or weight on file to calculate BMI. Physical Exam  Labs reviewed: Basic Metabolic Panel: Recent Labs    05/09/22 0640 08/07/22 0937  NA 144 140  K 3.9 3.9  CL 103 105  CO2  --  25  GLUCOSE 139* 144*  BUN 12 11  CREATININE 0.70 0.71  CALCIUM  --  9.9  TSH  --  5.54*   Liver Function Tests: Recent Labs    08/07/22 0937  AST 24  ALT 23  BILITOT 0.5  PROT 7.1   No results for input(s): "LIPASE", "AMYLASE" in the last 8760 hours. No results for input(s): "AMMONIA" in the last 8760 hours. CBC: Recent Labs    05/09/22 0640 08/07/22 0937  WBC  --  7.6  NEUTROABS  --  3,800  HGB 13.6 14.0  HCT 40.0 41.7  MCV  --  97.2  PLT  --  197   Lipid Panel: Recent Labs    08/07/22 0937   CHOL 121  HDL 46*  LDLCALC 50  TRIG 161*  CHOLHDL 2.6   Lab Results  Component Value Date   HGBA1C 6.2 (H) 08/07/2022    Procedures since last visit: No results found.  Assessment/Plan Acute cystitis with hematuria -  Plan: nitrofurantoin, macrocrystal-monohydrate, (MACROBID) 100 MG capsule  Dysuria - Plan: POC Urinalysis Dipstick Urinalysis positive for blood and ketones, will plan to start macrobid and f/u cultures. Encouraged increased hydration as well. F/u PRN.    Labs/tests ordered:  * No order type specified * Next appt:  02/07/2023

## 2023-01-31 NOTE — Addendum Note (Signed)
Addended by: Dicky Doe on: 01/31/2023 03:54 PM   Modules accepted: Orders

## 2023-01-31 NOTE — Telephone Encounter (Signed)
Ms. Wendy Obrien, Wendy Obrien are scheduled for a virtual visit with your provider today.    Just as we do with appointments in the office, we must obtain your consent to participate.  Your consent will be active for this visit and any virtual visit you Wendy Obrien have with one of our providers in the next 365 days.    If you have a MyChart account, I can also send a copy of this consent to you electronically.  All virtual visits are billed to your insurance company just like a traditional visit in the office.  As this is a virtual visit, video technology does not allow for your provider to perform a traditional examination.  This Wendy Obrien limit your provider's ability to fully assess your condition.  If your provider identifies any concerns that need to be evaluated in person or the need to arrange testing such as labs, EKG, etc, we will make arrangements to do so.    Although advances in technology are sophisticated, we cannot ensure that it will always work on either your end or our end.  If the connection with a video visit is poor, we Wendy Obrien have to switch to a telephone visit.  With either a video or telephone visit, we are not always able to ensure that we have a secure connection.   I need to obtain your verbal consent now.   Are you willing to proceed with your visit today?   Wendy Obrien Wendy Obrien has provided verbal consent on 01/31/2023 for a virtual visit (video or telephone).   Wendy Obrien, [place] 01/31/2023  12:49 PM

## 2023-01-31 NOTE — Progress Notes (Signed)
   This service is provided via telemedicine  No vital signs collected/recorded due to the encounter was a telemedicine visit.   Location of patient (ex: home, work):  Home  Patient consents to a telephone visit:  Yes  Location of the provider (ex: office, home):  Office [place].   Name of any referring provider:  na  Names of all persons participating in the telemedicine service and their role in the encounter:  Wendy Obrien, Patient, Nelda Severe, CMA, Earnestine Mealing, MD  Time spent on call:  6:45

## 2023-02-01 ENCOUNTER — Telehealth: Payer: Self-pay | Admitting: Student

## 2023-02-01 LAB — URINE CULTURE
MICRO NUMBER:: 14990836
SPECIMEN QUALITY:: ADEQUATE

## 2023-02-01 NOTE — Telephone Encounter (Signed)
Please contact patient to inform her urine culture had no growth. I recommend discontinuing the antibiotics at this time.

## 2023-02-02 ENCOUNTER — Ambulatory Visit: Payer: 59

## 2023-02-02 NOTE — Telephone Encounter (Signed)
Patient returned call and left message on clinical intake voicemail. I returned call to patient, no answer. I left a detailed message with Dr. Orene Desanctis recommendations and to call the office if any further concerns.

## 2023-02-02 NOTE — Telephone Encounter (Signed)
LMOM to return call.

## 2023-02-06 NOTE — Telephone Encounter (Signed)
Patient notified by Evie. Spoke with patient and Confirmed with patient she received message.

## 2023-02-07 ENCOUNTER — Encounter: Payer: Self-pay | Admitting: Family

## 2023-02-07 ENCOUNTER — Other Ambulatory Visit (HOSPITAL_COMMUNITY): Payer: Self-pay

## 2023-02-07 ENCOUNTER — Ambulatory Visit (INDEPENDENT_AMBULATORY_CARE_PROVIDER_SITE_OTHER): Payer: 59 | Admitting: Family

## 2023-02-07 VITALS — BP 140/78 | HR 52 | Temp 97.2°F | Resp 18 | Ht 68.0 in | Wt 283.0 lb

## 2023-02-07 DIAGNOSIS — Z1211 Encounter for screening for malignant neoplasm of colon: Secondary | ICD-10-CM

## 2023-02-07 DIAGNOSIS — K5901 Slow transit constipation: Secondary | ICD-10-CM | POA: Diagnosis not present

## 2023-02-07 DIAGNOSIS — E782 Mixed hyperlipidemia: Secondary | ICD-10-CM

## 2023-02-07 DIAGNOSIS — I10 Essential (primary) hypertension: Secondary | ICD-10-CM | POA: Diagnosis not present

## 2023-02-07 DIAGNOSIS — I5032 Chronic diastolic (congestive) heart failure: Secondary | ICD-10-CM | POA: Diagnosis not present

## 2023-02-07 DIAGNOSIS — E1165 Type 2 diabetes mellitus with hyperglycemia: Secondary | ICD-10-CM

## 2023-02-07 MED ORDER — POLYETHYLENE GLYCOL 3350 17 GM/SCOOP PO POWD
17.0000 g | Freq: Every day | ORAL | 1 refills | Status: DC
Start: 2023-02-07 — End: 2024-02-19
  Filled 2023-02-07: qty 3570, 210d supply, fill #0

## 2023-02-07 NOTE — Progress Notes (Signed)
Provider: Richarda Blade FNP-C   Delva Derden, Donalee Citrin, NP  Patient Care Team: Dea Bitting, Donalee Citrin, NP as PCP - General (Family Medicine) Nahser, Deloris Ping, MD as PCP - Cardiology (Cardiology) Tat, Octaviano Batty, DO as Consulting Physician (Neurology)  Extended Emergency Contact Information Primary Emergency Contact: Tamburri,[name] Address: [address]          [place], [place] [address] [place] Home Phone: [phone] Mobile Phone: [phone] Relation: Spouse Secondary Emergency Contact: Durning,John Mobile Phone: [phone] Relation: Other  Code Status:  Full Code  Goals of care: Advanced Directive information    02/07/2023    8:45 AM  Advanced Directives  Does Patient Have a Medical Advance Directive? No  Does patient want to make changes to medical advance directive? No - Patient declined  Would patient like information on creating a medical advance directive? No - Patient declined     Chief Complaint  Patient presents with   Medical Management of Chronic Issues    Patient is being seen for a 22M F/U for chronic conditions Patient had colonoscopy done 02/12/2019   Quality Metric Gaps     Hep C screening,updated covid vaccine,A1C, and foot exam needed patient would like colonoscopy referral if needed    HPI:  Pt is a 72 y.o. Obrien seen today for 6 months follow up medical management of chronic diseases. States has had increased stress level caring for husband who is on dialysis.Has not been able to care for herself due to being busy but has been taking own medication.     Type 2 DM - CBG 120's -140's  Has been watching what she eats.Follows up with ophthalmology last seen one week ago.No retinopathy.Has upcoming appointment with podiatrist.  Due for colon cancer screening.Had previous referral to GI but was advised to bring previous colonoscopy results.she brought in copy of colonoscopy today.will send to be scanned  into chart   HTN  - states  SBP 130's -140 she denies any headache,dizziness,vision changes,fatigue,chest tightness,palpitation,chest pain or shortness of breath.  No regular exercise.     Past Medical History:  Diagnosis Date   Arthritis    CHF (congestive heart failure) (HCC)    DM (diabetes mellitus) (HCC)    Essential tremor    head   G6PD deficiency    Glaucoma    Per new patient form   High cholesterol    Per new patient form   HTN (hypertension)    Hyperlipidemia    Past Surgical History:  Procedure Laterality Date   ABDOMINAL HYSTERECTOMY     Dr. Manson Passey / Per new patient form   ANKLE SURGERY Right    x 2   BREAST LUMPECTOMY Left    benign   DG  BONE DENSITY ([facility] HX)     Per new patient form   DIAGNOSTIC MAMMOGRAM     Per new patient form   EYE SURGERY Left    age 46 - L eye removed, Dr. Merilynn Finland / Per new patient form   MINOR RELEASE DORSAL COMPARTMENT (DEQUERVAINS) Left 05/09/2022   Procedure: Left dequervains release and left dorsal forearm mass excision;  Surgeon: Gomez Cleverly, MD;  Location: [facility];  Service: Orthopedics;  Laterality: Left;  Regional block   REDUCTION MAMMAPLASTY Bilateral    1998    Allergies  Allergen Reactions   Bee Venom Anaphylaxis   Beeswax Anaphylaxis    bees   Fish Allergy Anaphylaxis   Shellfish Allergy Anaphylaxis  And all seafood   Aspirin Other (See Comments)    G6P Deficiency   Egg-Derived Products Hives   Nsaids Other (See Comments)    G6P Deficiency   Sulfa Antibiotics Other (See Comments)    G6P Deficiency     Allergies as of 02/07/2023       Reactions   Bee Venom Anaphylaxis   Beeswax Anaphylaxis   bees   Fish Allergy Anaphylaxis   Shellfish Allergy Anaphylaxis   And all seafood   Aspirin Other (See Comments)   G6P Deficiency   Egg-derived Products Hives   Nsaids Other (See Comments)   G6P Deficiency   Sulfa Antibiotics Other (See Comments)   G6P Deficiency         Medication List        Accurate as  of Feb 07, 2023  9:04 AM. If you have any questions, ask your nurse or doctor.          acetaminophen 500 MG tablet Commonly known as: TYLENOL Take 2 tablets by mouth every 6 (six) hours as needed.   atorvastatin 40 MG tablet Commonly known as: LIPITOR TAKE 1 TABLET BY MOUTH EVERYDAY AT BEDTIME   B-D ULTRAFINE III SHORT PEN 31G X 8 MM Misc Generic drug: Insulin Pen Needle SMARTSIG:injection Twice Daily DX: E11.65   BETA CAROTENE PO Take 1 tablet by mouth daily with breakfast.   BIOTIN PO Take by mouth daily.   busPIRone 7.5 MG tablet Commonly known as: BUSPAR Take 7.5 mg by mouth daily with breakfast.   docusate 50 MG/5ML liquid Commonly known as: COLACE Take by mouth as needed for mild constipation. Patient takes one or twice a week as needed for constipation   EPINEPHrine 0.3 mg/0.3 mL Soaj injection Commonly known as: EPI-PEN Inject 0.3 mg into the muscle as needed for anaphylaxis.   Fish Oil 1200 MG Caps Take 1,200 mg by mouth daily with breakfast.   Hair/Skin/Nails/Biotin Tabs Take 1 tablet by mouth daily with breakfast.   hydrochlorothiazide 25 MG tablet Commonly known as: HYDRODIURIL TAKE 1 TABLET (25 MG TOTAL) BY MOUTH DAILY WITH BREAKFAST.   Lantus SoloStar 100 UNIT/ML Solostar Pen Generic drug: insulin glargine INJECT 29 UNIT INTO THE SKIN IN THE MORNING AND AT BEDTIME   latanoprost 0.005 % ophthalmic solution Commonly known as: XALATAN Place 1 drop into the right eye at bedtime.   losartan 100 MG tablet Commonly known as: COZAAR TAKE 1 TABLET BY MOUTH DAILY WITH BREAKFAST.   Maxitrol 0.1 % Oint Generic drug: neomycin-polymyxin-dexameth Place into the right eye as needed. Apply to eye.   multivitamin with minerals Tabs tablet Take 1 tablet by mouth daily with breakfast.   Neuriva Caps Take 1 capsule by mouth daily with breakfast.   nitrofurantoin (macrocrystal-monohydrate) 100 MG capsule Commonly known as: Macrobid Take 1 capsule (100 mg  total) by mouth 2 (two) times daily for 10 days.   propranolol ER 120 MG 24 hr capsule Commonly known as: INDERAL LA TAKE 1 CAPSULE (120 MG TOTAL) BY MOUTH AT BEDTIME.   SODIUM FLUORIDE (DENTAL RINSE) 0.2 % Soln Place 1 application onto teeth daily as needed (to strenghten teeth).   timolol 0.5 % ophthalmic solution Commonly known as: TIMOPTIC Place 1 drop into the right eye every morning.   traZODone 50 MG tablet Commonly known as: DESYREL Take 50 mg by mouth at bedtime as needed for sleep.        Review of Systems  Constitutional:  Negative for appetite change,  chills, fatigue, fever and unexpected weight change.  HENT:  Negative for congestion, dental problem, ear discharge, ear pain, facial swelling, hearing loss, nosebleeds, postnasal drip, rhinorrhea, sinus pressure, sinus pain, sneezing, sore throat, tinnitus and trouble swallowing.   Eyes:  Negative for pain, discharge, redness, itching and visual disturbance.  Respiratory:  Negative for cough, chest tightness, shortness of breath and wheezing.   Cardiovascular:  Negative for chest pain, palpitations and leg swelling.  Gastrointestinal:  Negative for abdominal distention, abdominal pain, blood in stool, constipation, diarrhea, nausea and vomiting.  Endocrine: Negative for cold intolerance, heat intolerance, polydipsia, polyphagia and polyuria.  Genitourinary:  Negative for difficulty urinating, dysuria, flank pain, frequency and urgency.  Musculoskeletal:  Negative for arthralgias, back pain, gait problem, joint swelling, myalgias, neck pain and neck stiffness.  Skin:  Negative for color change, pallor, rash and wound.  Neurological:  Negative for dizziness, syncope, speech difficulty, weakness, light-headedness, numbness and headaches.  Hematological:  Does not bruise/bleed easily.  Psychiatric/Behavioral:  Negative for agitation, behavioral problems, confusion, hallucinations, self-injury, sleep disturbance and suicidal  ideas. The patient is not nervous/anxious.     Immunization History  Administered Date(s) Administered   COVID-19, mRNA, vaccine(Comirnaty)12 years and older 11/27/2022, 11/28/2022   Influenza,trivalent, recombinat, inj, PF 08/07/2022   PFIZER(Purple Top)SARS-COV-2 Vaccination 09/14/2019, 10/22/2019, 06/26/2020, 01/19/2021, 06/21/2021, 06/14/2022   PNEUMOCOCCAL CONJUGATE-20 01/31/2022   Pneumococcal Polysaccharide-23 06/11/2020   Respiratory Syncytial Virus Vaccine,Recomb Aduvanted(Arexvy) 06/15/2022   Td 11/28/2022   Tdap 11/27/2022   Zoster Recombinat (Shingrix) 09/14/2017, 12/03/2017   Pertinent  Health Maintenance Due  Topic Date Due   Colonoscopy  Never done   FOOT EXAM  12/01/2022   HEMOGLOBIN A1C  02/05/2023   OPHTHALMOLOGY EXAM  03/12/2023   INFLUENZA VACCINE  04/12/2023   MAMMOGRAM  11/24/2023   DEXA SCAN  Completed      08/07/2022    8:45 AM 08/14/2022    8:55 AM 10/03/2022    2:25 PM 10/11/2022    1:37 PM 02/07/2023    8:45 AM  Fall Risk  Falls in the past year? 0 0 0 0 0  Was there an injury with Fall? 0 0 0 0 0  Fall Risk Category Calculator 0 0 0 0 0  Fall Risk Category (Retired) Low Low     (RETIRED) Patient Fall Risk Level Low fall risk Low fall risk     Patient at Risk for Falls Due to No Fall Risks No Fall Risks No Fall Risks No Fall Risks No Fall Risks  Fall risk Follow up Falls evaluation completed Falls evaluation completed Falls evaluation completed Falls evaluation completed Falls evaluation completed   Functional Status Survey:    Vitals:   02/07/23 0849  BP: (!) 140/78  Pulse: (!) 52  Resp: 18  Temp: (!) Wendy.2 F (36.2 C)  SpO2: 96%  Weight: 283 lb (128.4 kg)  Height: 5\' 8"  (1.727 m)   Body mass index is 43.03 kg/m. Physical Exam Vitals reviewed.  Constitutional:      General: She is not in acute distress.    Appearance: Normal appearance. She is obese. She is not ill-appearing or diaphoretic.  HENT:     Head: Normocephalic.      Right Ear: Tympanic membrane, ear canal and external ear normal. There is no impacted cerumen.     Left Ear: Tympanic membrane, ear canal and external ear normal. There is no impacted cerumen.     Nose: Nose normal. No congestion or rhinorrhea.  Mouth/Throat:     Mouth: Mucous membranes are moist.     Pharynx: Oropharynx is clear. No oropharyngeal exudate or posterior oropharyngeal erythema.  Eyes:     General: No scleral icterus.       Right eye: No discharge.        Left eye: No discharge.     Extraocular Movements: Extraocular movements intact.     Conjunctiva/sclera: Conjunctivae normal.     Pupils: Pupils are equal, round, and reactive to light.  Neck:     Vascular: No carotid bruit.  Cardiovascular:     Rate and Rhythm: Normal rate and regular rhythm.     Pulses: Normal pulses.     Heart sounds: Normal heart sounds. No murmur heard.    No friction rub. No gallop.  Pulmonary:     Effort: Pulmonary effort is normal. No respiratory distress.     Breath sounds: Normal breath sounds. No wheezing, rhonchi or rales.  Chest:     Chest wall: No tenderness.  Abdominal:     General: Bowel sounds are normal. There is no distension.     Palpations: Abdomen is soft. There is no mass.     Tenderness: There is no abdominal tenderness. There is no right CVA tenderness, left CVA tenderness, guarding or rebound.  Musculoskeletal:        General: No swelling or tenderness. Normal range of motion.     Cervical back: Normal range of motion. No rigidity or tenderness.     Right lower leg: No edema.     Left lower leg: No edema.  Lymphadenopathy:     Cervical: No cervical adenopathy.  Skin:    General: Skin is warm and dry.     Coloration: Skin is not pale.     Findings: No bruising, erythema, lesion or rash.  Neurological:     Mental Status: She is alert and oriented to person, place, and time.     Cranial Nerves: No cranial nerve deficit.     Sensory: No sensory deficit.     Motor: No  weakness.     Coordination: Coordination normal.     Gait: Gait normal.  Psychiatric:        Mood and Affect: Mood normal.        Speech: Speech normal.        Behavior: Behavior normal.        Thought Content: Thought content normal.        Judgment: Judgment normal.     Labs reviewed: Recent Labs    05/09/22 0640 08/07/22 0937  NA 144 140  K 3.9 3.9  CL 103 105  CO2  --  25  GLUCOSE 139* 144*  BUN 12 11  CREATININE 0.70 0.71  CALCIUM  --  9.9   Recent Labs    08/07/22 0937  AST 24  ALT 23  BILITOT 0.5  PROT 7.1   Recent Labs    05/09/22 0640 08/07/22 0937  WBC  --  7.6  NEUTROABS  --  3,800  HGB 13.6 14.0  HCT 40.0 41.7  MCV  --  Wendy.2  PLT  --  197   Lab Results  Component Value Date   TSH 5.54 (H) 08/07/2022   Lab Results  Component Value Date   HGBA1C 6.2 (H) 08/07/2022   Lab Results  Component Value Date   CHOL 121 08/07/2022   HDL 46 (L) 08/07/2022   LDLCALC 50 08/07/2022   TRIG 174 (H)  08/07/2022   CHOLHDL 2.6 08/07/2022    Significant Diagnostic Results in last 30 days:  No results found.  Assessment/Plan  1. Type 2 diabetes mellitus with hyperglycemia, without long-term current use of insulin (HCC) Lab Results  Component Value Date   HGBA1C 6.2 (H) 08/07/2022  Dietary modification and exercise advised -Up-to-date with annual eye and foot exam.  No retinopathy or neuropathy -Continue on losartan -Continue on Lantus -Continue on statin for cardiovascular event prevention - Lipid panel - TSH - COMPLETE METABOLIC PANEL WITH GFR - CBC with Differential/Platelet - Hemoglobin A1c  2. Primary hypertension Blood pressure not at goal this visit but well-controlled at home. -Continue to monitor and notify provider if greater than 140/90 -Continue on losartan, propranolol ER and hydrochlorothiazide - TSH - COMPLETE METABOLIC PANEL WITH GFR - CBC with Differential/Platelet  3. Mixed hyperlipidemia Previous LDL at goal with high  triglycerides -Continue with dietary modification and exercise as above -Continue on omega-3 fatty acids and atorvastatin - Lipid panel  4. Chronic diastolic CHF (congestive heart failure) (HCC) No signs of fluid overload -Bilateral lung clear on auscultation -Continue on hydrochlorothiazide - COMPLETE METABOLIC PANEL WITH GFR  5. Colon cancer screening Asymptomatic - Cologuard versus colonoscopy discussed patient prefers colonoscopy.Will refer to gastroenterology.Made aware specialist office will call him to schedule appointment. - Ambulatory referral to Gastroenterology  6. Slow transit constipation -Encouraged to increase fiber in the diet -Hydration encouraged -Continue on Docusate - polyethylene glycol powder (GLYCOLAX/MIRALAX) 17 GM/SCOOP powder; Take 17 g and stir and dissolve in 4 to 8 ounces of liquid and drink it right away by mouth daily. Hold for loose stool.  Dispense: 3350 g; Refill: 1  Family/ staff Communication: Reviewed plan of care with patient verbalized understanding   Labs/tests ordered:  - Lipid panel - TSH - COMPLETE METABOLIC PANEL WITH GFR - CBC with Differential/Platelet - Hemoglobin A1c  Next Appointment : Return in about 6 months (around 08/10/2023) for medical mangement of chronic issues.Caesar Bookman, NP

## 2023-02-08 LAB — CBC WITH DIFFERENTIAL/PLATELET
Absolute Monocytes: 570 cells/uL (ref 200–950)
Basophils Absolute: 47 cells/uL (ref 0–200)
Basophils Relative: 0.7 %
Eosinophils Absolute: 241 cells/uL (ref 15–500)
Eosinophils Relative: 3.6 %
HCT: 41 % (ref 35.0–45.0)
Hemoglobin: 13.4 g/dL (ref 11.7–15.5)
Lymphs Abs: 2486 cells/uL (ref 850–3900)
MCH: 31.6 pg (ref 27.0–33.0)
MCHC: 32.7 g/dL (ref 32.0–36.0)
MCV: 96.7 fL (ref 80.0–100.0)
MPV: 11.1 fL (ref 7.5–12.5)
Monocytes Relative: 8.5 %
Neutro Abs: 3357 cells/uL (ref 1500–7800)
Neutrophils Relative %: 50.1 %
Platelets: 199 10*3/uL (ref 140–400)
RBC: 4.24 10*6/uL (ref 3.80–5.10)
RDW: 10.9 % — ABNORMAL LOW (ref 11.0–15.0)
Total Lymphocyte: 37.1 %
WBC: 6.7 10*3/uL (ref 3.8–10.8)

## 2023-02-08 LAB — LIPID PANEL
Cholesterol: 114 mg/dL (ref <200)
HDL: 42 mg/dL — ABNORMAL LOW (ref >=50)
LDL Cholesterol (Calc): 49 mg/dL (calc)
Non-HDL Cholesterol (Calc): 72 mg/dL (calc) (ref <130)
Total CHOL/HDL Ratio: 2.7 (calc) (ref <5.0)
Triglycerides: 152 mg/dL — ABNORMAL HIGH (ref <150)

## 2023-02-08 LAB — COMPLETE METABOLIC PANEL WITH GFR
AG Ratio: 1.5 (calc) (ref 1.0–2.5)
ALT: 19 U/L (ref 6–29)
AST: 21 U/L (ref 10–35)
Albumin: 4.1 g/dL (ref 3.6–5.1)
Alkaline phosphatase (APISO): 77 U/L (ref 37–153)
BUN: 15 mg/dL (ref 7–25)
CO2: 28 mmol/L (ref 20–32)
Calcium: 9.5 mg/dL (ref 8.6–10.4)
Chloride: 104 mmol/L (ref 98–110)
Creat: 0.85 mg/dL (ref 0.60–1.00)
Globulin: 2.7 g/dL (calc) (ref 1.9–3.7)
Glucose, Bld: 159 mg/dL — ABNORMAL HIGH (ref 65–99)
Potassium: 4.1 mmol/L (ref 3.5–5.3)
Sodium: 140 mmol/L (ref 135–146)
Total Bilirubin: 0.7 mg/dL (ref 0.2–1.2)
Total Protein: 6.8 g/dL (ref 6.1–8.1)
eGFR: 73 mL/min/{1.73_m2} (ref >=60)

## 2023-02-08 LAB — HEMOGLOBIN A1C
Hgb A1c MFr Bld: 6.3 % of total Hgb — ABNORMAL HIGH (ref <5.7)
Mean Plasma Glucose: 134 mg/dL
eAG (mmol/L): 7.4 mmol/L

## 2023-02-08 LAB — TSH: TSH: 2.97 mIU/L (ref 0.40–4.50)

## 2023-02-12 DIAGNOSIS — H43811 Vitreous degeneration, right eye: Secondary | ICD-10-CM | POA: Diagnosis not present

## 2023-02-12 DIAGNOSIS — H40021 Open angle with borderline findings, high risk, right eye: Secondary | ICD-10-CM | POA: Diagnosis not present

## 2023-02-14 ENCOUNTER — Encounter: Payer: Self-pay | Admitting: Gastroenterology

## 2023-02-14 ENCOUNTER — Telehealth: Payer: Self-pay | Admitting: Gastroenterology

## 2023-02-14 NOTE — Telephone Encounter (Signed)
Good Afternoon Dr Barron Alvine,  Supervising Provider 6/5 PM   I have received a referral for this patient to have a colonoscopy done with [place]. Patient has previous GI history in [place], [place] and is wishing to transfer to [place] due to recently moving here. Records are available to view under the media tab, please review them at your earliest convenience and advise on scheduling.  Thank You!

## 2023-02-14 NOTE — Telephone Encounter (Signed)
Pt scheduled 7/24 at 8:00 am

## 2023-02-26 ENCOUNTER — Other Ambulatory Visit: Payer: Self-pay

## 2023-02-26 MED ORDER — BD PEN NEEDLE SHORT U/F 31G X 8 MM MISC: 1 refills | Status: DC

## 2023-02-26 MED ORDER — LANTUS SOLOSTAR 100 UNIT/ML ~~LOC~~ SOPN: PEN_INJECTOR | SUBCUTANEOUS | 5 refills | Status: DC

## 2023-03-01 DIAGNOSIS — E119 Type 2 diabetes mellitus without complications: Secondary | ICD-10-CM | POA: Diagnosis not present

## 2023-03-14 DIAGNOSIS — E1169 Type 2 diabetes mellitus with other specified complication: Secondary | ICD-10-CM | POA: Diagnosis not present

## 2023-03-16 NOTE — Telephone Encounter (Signed)
Message routed to PCP Ngetich, Dinah C, NP  

## 2023-03-19 ENCOUNTER — Ambulatory Visit (AMBULATORY_SURGERY_CENTER): Payer: 59 | Admitting: *Deleted

## 2023-03-19 ENCOUNTER — Encounter: Payer: Self-pay | Admitting: Gastroenterology

## 2023-03-19 VITALS — Ht 68.0 in | Wt 250.0 lb

## 2023-03-19 DIAGNOSIS — Z800 Family history of malignant neoplasm of digestive organs: Secondary | ICD-10-CM

## 2023-03-19 DIAGNOSIS — Z86010 Personal history of colonic polyps: Secondary | ICD-10-CM

## 2023-03-19 MED ORDER — NA SULFATE-K SULFATE-MG SULF 17.5-3.13-1.6 GM/177ML PO SOLN
1.00 | Freq: Once | ORAL | 0 refills | Status: AC
Start: 2023-03-19 — End: 2023-03-19

## 2023-03-19 NOTE — Progress Notes (Addendum)
Pt's name and DOB verified at the beginning of the pre-visit.  Pt denies any difficulty with ambulating,sitting, laying down or rolling side to side Gave both [facility] main # and MD on call # prior to instructions.  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 Pt states they will Pt is not on dialysis Pt denise any abnormal heart rhythms  Pt denies any upcoming cardiac testing Pt encouraged to use to use Singlecare or Goodrx to reduce cost  Patient's chart reviewed by Cathlyn Parsons CNRA prior to pre-visit and patient appropriate for the [facility].  Pre-visit completed and red dot placed by patient's name on their procedure day (on provider's schedule).  . Visit by phone Pt states weight is 250 lb Instructed pt why it is important to and  to call if they have any changes in health or new medications. Directed them to the # given and on instructions.   Pt states they will.  Instructions reviewed with pt and pt states understanding. Instructed to review again prior to procedure. Pt states they will.  Instructions sent by mail with coupon and by my chart

## 2023-03-21 ENCOUNTER — Ambulatory Visit
Admission: RE | Admit: 2023-03-21 | Discharge: 2023-03-21 | Disposition: A | Discharge status: Home / Self Care | Payer: 59 | Source: Ambulatory Visit | Attending: Family | Admitting: Family

## 2023-03-21 DIAGNOSIS — Z1231 Encounter for screening mammogram for malignant neoplasm of breast: Secondary | ICD-10-CM

## 2023-04-04 ENCOUNTER — Encounter: Payer: 59 | Admitting: Gastroenterology

## 2023-04-14 DIAGNOSIS — E1169 Type 2 diabetes mellitus with other specified complication: Secondary | ICD-10-CM | POA: Diagnosis not present

## 2023-04-16 ENCOUNTER — Other Ambulatory Visit: Payer: Self-pay | Admitting: Family

## 2023-04-16 ENCOUNTER — Telehealth: Payer: Self-pay | Admitting: *Deleted

## 2023-04-16 ENCOUNTER — Other Ambulatory Visit (HOSPITAL_COMMUNITY): Payer: Self-pay

## 2023-04-16 DIAGNOSIS — E1165 Type 2 diabetes mellitus with hyperglycemia: Secondary | ICD-10-CM

## 2023-04-16 MED ORDER — TIRZEPATIDE 2.5 MG/0.5ML ~~LOC~~ SOAJ: 2.5000 mg | SUBCUTANEOUS | 3 refills | Status: DC

## 2023-04-16 MED ORDER — LANTUS SOLOSTAR 100 UNIT/ML ~~LOC~~ SOPN
PEN_INJECTOR | SUBCUTANEOUS | 5 refills | Status: DC
Start: 2023-04-16 — End: 2023-04-18
  Filled 2023-04-16: qty 15, 24d supply, fill #0

## 2023-04-16 NOTE — Telephone Encounter (Signed)
-  will  increase Lantus from 29 units to 32 units SQ  in the morning and at bedtime.  - start on Mounjaro 2.5 mg inject SQ once a week.   - schedule one month follow up for evaluation on blood sugars.

## 2023-04-16 NOTE — Telephone Encounter (Signed)
Patient called and stated that at her last appointment it was Discussed that you were going to:  1) Change patient's Night time Insulin to 32 units at bedtime but it was never sent to pharmacy.   2) ALSO, patient stated that you were going to prescribe her to start taking Mounjaro but it was not sent to her pharmacy.   Please Advise.

## 2023-04-16 NOTE — Telephone Encounter (Signed)
Please Advise

## 2023-04-17 ENCOUNTER — Other Ambulatory Visit (HOSPITAL_COMMUNITY): Payer: Self-pay

## 2023-04-17 NOTE — Telephone Encounter (Signed)
Patient wants to know why you increased The Lantus in the morning from 29 to 32, stated that you both ONLY discussed increasing the evening dose. Does not want to Increase the morning unless there is a reason.   Please Advise.

## 2023-04-17 NOTE — Telephone Encounter (Signed)
May continue with current dose in the morning and increase bedtime to 32 units as requested.

## 2023-04-18 ENCOUNTER — Other Ambulatory Visit: Payer: Self-pay | Admitting: Family

## 2023-04-18 DIAGNOSIS — G250 Essential tremor: Secondary | ICD-10-CM

## 2023-04-18 MED ORDER — LANTUS SOLOSTAR 100 UNIT/ML ~~LOC~~ SOPN
PEN_INJECTOR | SUBCUTANEOUS | 5 refills | Status: DC
Start: 2023-04-18 — End: 2023-05-22

## 2023-04-18 NOTE — Telephone Encounter (Signed)
Patient notified and agreed.  Updated Rx

## 2023-05-03 ENCOUNTER — Encounter: Payer: Self-pay | Admitting: Gastroenterology

## 2023-05-15 DIAGNOSIS — E1169 Type 2 diabetes mellitus with other specified complication: Secondary | ICD-10-CM | POA: Diagnosis not present

## 2023-05-22 ENCOUNTER — Encounter: Payer: Self-pay | Admitting: Family

## 2023-05-22 ENCOUNTER — Ambulatory Visit (INDEPENDENT_AMBULATORY_CARE_PROVIDER_SITE_OTHER): Payer: 59 | Admitting: Family

## 2023-05-22 ENCOUNTER — Other Ambulatory Visit (HOSPITAL_COMMUNITY): Payer: Self-pay

## 2023-05-22 VITALS — BP 130/80 | HR 63 | Temp 98.0°F | Resp 16 | Ht 68.0 in | Wt 281.0 lb

## 2023-05-22 DIAGNOSIS — Z23 Encounter for immunization: Secondary | ICD-10-CM | POA: Diagnosis not present

## 2023-05-22 DIAGNOSIS — E1165 Type 2 diabetes mellitus with hyperglycemia: Secondary | ICD-10-CM | POA: Diagnosis not present

## 2023-05-22 DIAGNOSIS — I10 Essential (primary) hypertension: Secondary | ICD-10-CM | POA: Diagnosis not present

## 2023-05-22 MED ORDER — LANTUS SOLOSTAR 100 UNIT/ML ~~LOC~~ SOPN
PEN_INJECTOR | SUBCUTANEOUS | 5 refills | Status: DC
Start: 2023-05-22 — End: 2023-05-23
  Filled 2023-05-22: qty 15, 75d supply, fill #0

## 2023-05-22 MED ORDER — LOSARTAN POTASSIUM 50 MG PO TABS
50.0000 mg | ORAL_TABLET | Freq: Every day | ORAL | 1 refills | Status: DC
Start: 2023-05-22 — End: 2023-05-23
  Filled 2023-05-22: qty 90, 90d supply, fill #0

## 2023-05-22 NOTE — Progress Notes (Signed)
Provider: Richarda Blade FNP-C  Cassidee Deats, Donalee Citrin, NP  Patient Care Team: Narcissa Melder, Donalee Citrin, NP as PCP - General (Family Medicine) Nahser, Deloris Ping, MD as PCP - Cardiology (Cardiology) Tat, Octaviano Batty, DO as Consulting Physician (Neurology)  Extended Emergency Contact Information Primary Emergency Contact: Moulton,[name] Address: [address]          [place], [place] [address] [place] of [place] Home Phone: [phone] Mobile Phone: [phone] Relation: Spouse Secondary Emergency Contact: Durning,John Mobile Phone: [phone] Relation: Other  Code Status:  Full Code  Goals of care: Advanced Directive information    05/22/2023    2:50 PM  Advanced Directives  Does Patient Have a Medical Advance Directive? No     Chief Complaint  Patient presents with   Follow-up    Discuss insulin use - states she has lowered her insulin dose to 28 in the am and 30 in the evening.  BP has been running low along with lower BS     HPI:  Pt is a 72 y.o. female seen today for an acute visit for evaluation of low blood pressure and low blood sugars. States blood sugars have been running in the 70's -100's.woke up one night sweating her CBG was in the 60's.He drunk OJ with much improvement.she reduced her Lantus to 28 in the morning and 30 units in the evening. Currently on Ozempic 0.25 mg for the past 4 weeks supposed to increase to 0.5 mg   Also states B/p has been low readings in the 100's/50's -110's/50's had some dizziness.currently on losartan 100 mg tablet  and Hydrochlorothiazide.B/p within normal range during the visit.   Past Medical History:  Diagnosis Date   Allergy    Anxiety    Arthritis    Cataract    CHF (congestive heart failure) (HCC)    DM (diabetes mellitus) (HCC)    Essential tremor    head   G6PD deficiency    Glaucoma    Per new patient form   High cholesterol    Per new patient form   HTN (hypertension)    Hyperlipidemia    Sickle cell  trait (HCC)    Past Surgical History:  Procedure Laterality Date   ABDOMINAL HYSTERECTOMY     Dr. Manson Passey / Per new patient form   ANKLE SURGERY Right    x 2   BREAST LUMPECTOMY Left    benign   DG  BONE DENSITY ([facility] HX)     Per new patient form   DIAGNOSTIC MAMMOGRAM     Per new patient form   EYE SURGERY Left    age 60 - L eye removed, Dr. Merilynn Finland / Per new patient form   MINOR RELEASE DORSAL COMPARTMENT (DEQUERVAINS) Left 05/09/2022   Procedure: Left dequervains release and left dorsal forearm mass excision;  Surgeon: Gomez Cleverly, MD;  Location: [facility];  Service: Orthopedics;  Laterality: Left;  Regional block   REDUCTION MAMMAPLASTY Bilateral    1998    Allergies  Allergen Reactions   Bee Venom Anaphylaxis   Beeswax Anaphylaxis    bees   Fish Allergy Anaphylaxis   Shellfish Allergy Anaphylaxis    And all seafood   Aspirin Other (See Comments)    G6P Deficiency   Egg-Derived Products Hives   Nsaids Other (See Comments)    G6P Deficiency   Sulfa Antibiotics Other (See Comments)    G6P Deficiency     Outpatient Encounter Medications as of  05/22/2023  Medication Sig   acetaminophen (TYLENOL) 500 MG tablet Take 2 tablets by mouth every 6 (six) hours as needed.   atorvastatin (LIPITOR) 40 MG tablet TAKE 1 TABLET BY MOUTH EVERYDAY AT BEDTIME   B-D ULTRAFINE III SHORT PEN 31G X 8 MM MISC SMARTSIG:injection Twice Daily DX: E11.65   BETA CAROTENE PO Take 1 tablet by mouth daily with breakfast.   BIOTIN PO Take by mouth daily.   busPIRone (BUSPAR) 7.5 MG tablet Take 7.5 mg by mouth daily with breakfast.   docusate (COLACE) 50 MG/5ML liquid Take by mouth as needed for mild constipation. Patient takes one or twice a week as needed for constipation   EPINEPHrine 0.3 mg/0.3 mL IJ SOAJ injection Inject 0.3 mg into the muscle as needed for anaphylaxis.   hydrochlorothiazide (HYDRODIURIL) 25 MG tablet TAKE 1 TABLET (25 MG TOTAL) BY MOUTH DAILY WITH BREAKFAST.    insulin glargine (LANTUS SOLOSTAR) 100 UNIT/ML Solostar Pen INJECT 29 UNIT INTO THE SKIN IN THE MORNING AND 32 UNITS AT BEDTIME (Patient taking differently: INJECT 28 UNIT INTO THE SKIN IN THE MORNING AND 30 UNITS AT BEDTIME)   latanoprost (XALATAN) 0.005 % ophthalmic solution Place 1 drop into the right eye at bedtime.   losartan (COZAAR) 100 MG tablet TAKE 1 TABLET BY MOUTH DAILY WITH BREAKFAST.   MAXITROL 3.5-10000-0.1 OINT Place into the right eye as needed. Apply to eye.   Misc Natural Products (NEURIVA) CAPS Take 1 capsule by mouth daily with breakfast.   Multiple Vitamin (MULTIVITAMIN WITH MINERALS) TABS tablet Take 1 tablet by mouth daily with breakfast.   Multiple Vitamins-Minerals (HAIR/SKIN/NAILS/BIOTIN) TABS Take 1 tablet by mouth daily with breakfast.   Omega-3 Fatty Acids (FISH OIL) 1200 MG CAPS Take 1,200 mg by mouth daily with breakfast.   OZEMPIC, 0.25 OR 0.5 MG/DOSE, 2 MG/3ML SOPN INJECT 0.25MG  ONCE WEEKLY BY SUBCUTANEOUS ROUTE FOR 4 WKS, THEN INCREASE TO 0.5MG  THEREAFTER   polyethylene glycol powder (GLYCOLAX/MIRALAX) 17 GM/SCOOP powder Take 17 g and stir and dissolve in 4 to 8 ounces of liquid and drink it right away by mouth daily. Hold for loose stool.   propranolol ER (INDERAL LA) 120 MG 24 hr capsule TAKE 1 CAPSULE BY MOUTH AT BEDTIME.   SODIUM FLUORIDE, DENTAL RINSE, 0.2 % SOLN Place 1 application onto teeth daily as needed (to strenghten teeth).   timolol (TIMOPTIC) 0.5 % ophthalmic solution Place 1 drop into the right eye every morning.   traZODone (DESYREL) 50 MG tablet Take 50 mg by mouth at bedtime as needed for sleep. Takes maybe 3-4 times a month per pt   Zinc 50 MG TABS Take by mouth.   No facility-administered encounter medications on file as of 05/22/2023.    Review of Systems  Constitutional:  Negative for appetite change, chills, fatigue, fever and unexpected weight change.  HENT:  Negative for congestion, dental problem, ear discharge, ear pain, facial  swelling, hearing loss, nosebleeds, postnasal drip, rhinorrhea, sinus pressure, sinus pain, sneezing and sore throat.   Eyes:  Negative for pain, discharge, redness, itching and visual disturbance.  Respiratory:  Negative for cough, chest tightness, shortness of breath and wheezing.   Cardiovascular:  Negative for chest pain, palpitations and leg swelling.  Gastrointestinal:  Negative for abdominal distention, abdominal pain, constipation, diarrhea, nausea and vomiting.  Endocrine: Negative for cold intolerance, heat intolerance, polydipsia, polyphagia and polyuria.  Musculoskeletal:  Negative for arthralgias, back pain, gait problem, joint swelling and myalgias.  Skin:  Negative for color  change, pallor and rash.  Neurological:  Negative for dizziness, weakness, light-headedness, numbness and headaches.    Immunization History  Administered Date(s) Administered   COVID-19, mRNA, vaccine(Comirnaty)12 years and older 11/27/2022, 11/28/2022   Influenza,trivalent, recombinat, inj, PF 08/07/2022   PFIZER(Purple Top)SARS-COV-2 Vaccination 09/14/2019, 10/22/2019, 06/26/2020, 01/19/2021, 06/21/2021, 06/14/2022   PNEUMOCOCCAL CONJUGATE-20 01/31/2022   Pneumococcal Polysaccharide-23 06/11/2020   Respiratory Syncytial Virus Vaccine,Recomb Aduvanted(Arexvy) 06/15/2022   Td 11/28/2022   Tdap 11/27/2022   Zoster Recombinant(Shingrix) 09/14/2017, 12/03/2017   Pertinent  Health Maintenance Due  Topic Date Due   FOOT EXAM  12/01/2022   OPHTHALMOLOGY EXAM  03/12/2023   INFLUENZA VACCINE  04/12/2023   HEMOGLOBIN A1C  08/10/2023   Colonoscopy  01/10/2025   MAMMOGRAM  03/20/2025   DEXA SCAN  Completed      08/14/2022    8:55 AM 10/03/2022    2:25 PM 10/11/2022    1:37 PM 02/07/2023    8:45 AM 05/22/2023    2:50 PM  Fall Risk  Falls in the past year? 0 0 0 0 0  Was there an injury with Fall? 0 0 0 0   Fall Risk Category Calculator 0 0 0 0   Fall Risk Category (Retired) Low      (RETIRED) Patient  Fall Risk Level Low fall risk      Patient at Risk for Falls Due to No Fall Risks No Fall Risks No Fall Risks No Fall Risks   Fall risk Follow up Falls evaluation completed Falls evaluation completed Falls evaluation completed Falls evaluation completed    Functional Status Survey:    Vitals:   05/22/23 1453  BP: 130/80  Pulse: 63  Resp: 16  Temp: 98 F (36.7 C)  SpO2: 97%  Weight: 281 lb (127.5 kg)  Height: 5\' 8"  (1.727 m)   Body mass index is 42.73 kg/m. Physical Exam Vitals reviewed.  Constitutional:      General: She is not in acute distress.    Appearance: Normal appearance. She is obese. She is not ill-appearing or diaphoretic.  HENT:     Head: Normocephalic.     Mouth/Throat:     Mouth: Mucous membranes are moist.     Pharynx: Oropharynx is clear. No oropharyngeal exudate or posterior oropharyngeal erythema.  Eyes:     General: No scleral icterus.       Right eye: No discharge.        Left eye: No discharge.     Extraocular Movements: Extraocular movements intact.     Conjunctiva/sclera: Conjunctivae normal.     Pupils: Pupils are equal, round, and reactive to light.  Neck:     Vascular: No carotid bruit.  Cardiovascular:     Rate and Rhythm: Normal rate and regular rhythm.     Pulses: Normal pulses.     Heart sounds: Normal heart sounds. No murmur heard.    No friction rub. No gallop.  Pulmonary:     Effort: Pulmonary effort is normal. No respiratory distress.     Breath sounds: Normal breath sounds. No wheezing, rhonchi or rales.  Chest:     Chest wall: No tenderness.  Abdominal:     General: Bowel sounds are normal. There is no distension.     Palpations: Abdomen is soft. There is no mass.     Tenderness: There is no abdominal tenderness. There is no right CVA tenderness, left CVA tenderness, guarding or rebound.  Musculoskeletal:        General: No swelling  or tenderness. Normal range of motion.     Cervical back: Normal range of motion. No rigidity  or tenderness.     Right lower leg: No edema.     Left lower leg: No edema.  Lymphadenopathy:     Cervical: No cervical adenopathy.  Skin:    General: Skin is warm and dry.     Coloration: Skin is not pale.     Findings: No bruising, erythema, lesion or rash.  Neurological:     Mental Status: She is alert and oriented to person, place, and time.     Motor: No weakness.     Gait: Gait normal.  Psychiatric:        Mood and Affect: Mood normal.        Speech: Speech normal.        Behavior: Behavior normal.     Labs reviewed: Recent Labs    08/07/22 0937 02/07/23 0936  NA 140 140  K 3.9 4.1  CL 105 104  CO2 25 28  GLUCOSE 144* 159*  BUN 11 15  CREATININE 0.71 0.85  CALCIUM 9.9 9.5   Recent Labs    08/07/22 0937 02/07/23 0936  AST 24 21  ALT 23 19  BILITOT 0.5 0.7  PROT 7.1 6.8   Recent Labs    08/07/22 0937 02/07/23 0936  WBC 7.6 6.7  NEUTROABS 3,800 3,357  HGB 14.0 13.4  HCT 41.7 41.0  MCV 97.2 96.7  PLT 197 199   Lab Results  Component Value Date   TSH 2.97 02/07/2023   Lab Results  Component Value Date   HGBA1C 6.3 (H) 02/07/2023   Lab Results  Component Value Date   CHOL 114 02/07/2023   HDL 42 (L) 02/07/2023   LDLCALC 49 02/07/2023   TRIG 152 (H) 02/07/2023   CHOLHDL 2.7 02/07/2023    Significant Diagnostic Results in last 30 days:  No results found.  Assessment/Plan  1. Type 2 diabetes mellitus with hyperglycemia, without long-term current use of insulin (HCC) Lab Results  Component Value Date   HGBA1C 6.3 (H) 02/07/2023  Has had low CBG readings  - advised to reduce Lantus from 28 units in the morning to 20 units and discontinue 30 units at bedtime. - will increase Ozempic to 0.5 mg inject weekly  - Continue to monitor blood sugars and notify provider if blood sugars are less than 75 or greater than 150  - continue to reduce Lantus by 2 units if CBG still low.  - monofilament sensation intact  - Advised to schedule appointment  with Ophthalmology for annual eye exam  - insulin glargine (LANTUS SOLOSTAR) 100 UNIT/ML Solostar Pen; INJECT 20 UNIT INTO THE SKIN IN THE MORNING  Dispense: 15 mL; Refill: 5 - losartan (COZAAR) 50 MG tablet; Take 1 tablet (50 mg total) by mouth daily with breakfast.  Dispense: 90 tablet; Refill: 1  2. Primary hypertension Soft B/p reported with dizziness  Advised to reduce losartan from 100 mg table to 50 mg tablet  - continue on Hydralazine  - Notify provider if B/p is greater than 140/90  - losartan (COZAAR) 50 MG tablet; Take 1 tablet (50 mg total) by mouth daily with breakfast.  Dispense: 90 tablet; Refill: 1  3. Need for influenza vaccination Afebrile  Flut shot administered by CMA no acute reaction reported.  - Flu Vaccine Trivalent High Dose (Fluad)  Family/ staff Communication: Reviewed plan of care with patient verbalized understanding   Labs/tests ordered: None  Next Appointment: Return if symptoms worsen or fail to improve.   Caesar Bookman, NP

## 2023-05-22 NOTE — Patient Instructions (Addendum)
-   Notify provider if B/p is greater than 140/90  - continue to monitor blood sugars notify provider if blood sugars are less than 75 or greater than 150

## 2023-05-23 ENCOUNTER — Other Ambulatory Visit (HOSPITAL_COMMUNITY): Payer: Self-pay

## 2023-05-23 ENCOUNTER — Telehealth: Payer: Self-pay

## 2023-05-23 DIAGNOSIS — E1165 Type 2 diabetes mellitus with hyperglycemia: Secondary | ICD-10-CM

## 2023-05-23 DIAGNOSIS — I10 Essential (primary) hypertension: Secondary | ICD-10-CM

## 2023-05-23 MED ORDER — LOSARTAN POTASSIUM 50 MG PO TABS
50.0000 mg | ORAL_TABLET | Freq: Every day | ORAL | 1 refills | Status: DC
Start: 2023-05-23 — End: 2023-05-28

## 2023-05-23 MED ORDER — LANTUS SOLOSTAR 100 UNIT/ML ~~LOC~~ SOPN
PEN_INJECTOR | SUBCUTANEOUS | 5 refills | Status: DC
Start: 2023-05-23 — End: 2023-07-11

## 2023-05-23 NOTE — Telephone Encounter (Signed)
Patient called to review her AVS as she was not clear on all instructions given to her at visit yesterday.  1.) Patient confirmed instructions about discontinuing nighttime insulin  2.) Patient clarified when she is to follow-up and next appointment date and time  3.) Patient questioned pharmacy in which rx's were sent to as she has never heard of [facility].  I have updated pharmacy and resubmitted rx's to correct pharmacy. I called [facility] pharmacy and left message for them to void rx's.

## 2023-05-28 ENCOUNTER — Other Ambulatory Visit: Payer: Self-pay | Admitting: Family

## 2023-05-28 ENCOUNTER — Other Ambulatory Visit: Payer: Self-pay

## 2023-05-28 DIAGNOSIS — E1165 Type 2 diabetes mellitus with hyperglycemia: Secondary | ICD-10-CM

## 2023-05-28 DIAGNOSIS — I10 Essential (primary) hypertension: Secondary | ICD-10-CM

## 2023-05-28 MED ORDER — LOSARTAN POTASSIUM 50 MG PO TABS: 50.0000 mg | ORAL_TABLET | Freq: Every day | ORAL | 1 refills | Status: DC

## 2023-05-28 MED ORDER — HYDROCHLOROTHIAZIDE 25 MG PO TABS: 25.0000 mg | ORAL_TABLET | Freq: Every day | ORAL | 1 refills | Status: DC

## 2023-05-31 ENCOUNTER — Encounter: Payer: Self-pay | Admitting: Cardiovascular Disease
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
## 2023-06-01 ENCOUNTER — Ambulatory Visit: Payer: 59 | Attending: Cardiovascular Disease | Admitting: Cardiovascular Disease

## 2023-06-01 ENCOUNTER — Encounter: Payer: Self-pay | Admitting: Cardiovascular Disease

## 2023-06-01 VITALS — BP 124/75 | HR 68 | Ht 68.0 in | Wt 280.8 lb

## 2023-06-01 DIAGNOSIS — I5032 Chronic diastolic (congestive) heart failure: Secondary | ICD-10-CM

## 2023-06-01 NOTE — Patient Instructions (Signed)
Medication Instructions:  Your physician recommends that you continue on your current medications as directed. Please refer to the Current Medication list given to you today.  *If you need a refill on your cardiac medications before your next appointment, please call your pharmacy*   Lab Work: NONE If you have labs (blood work) drawn today and your tests are completely normal, you will receive your results only by: MyChart Message (if you have MyChart) OR A paper copy in the mail If you have any lab test that is abnormal or we need to change your treatment, we will call you to review the results.   Testing/Procedures: NONE   Follow-Up: At [facility], you and your health needs are our priority.  As part of our continuing mission to provide you with exceptional heart care, we have created designated Provider Care Teams.  These Care Teams include your primary Cardiologist (physician) and Advanced Practice Providers (APPs -  Physician Assistants and Nurse Practitioners) who all work together to provide you with the care you need, when you need it.  We recommend signing up for the patient portal called "MyChart".  Sign up information is provided on this After Visit Summary.  MyChart is used to connect with patients for Virtual Visits (Telemedicine).  Patients are able to view lab/test results, encounter notes, upcoming appointments, etc.  Non-urgent messages can be sent to your provider as well.   To learn more about what you can do with MyChart, go to [email or web address].    Your next appointment:   1 year(s)  Provider:   Philip Nahser, MD      

## 2023-06-04 DIAGNOSIS — E1165 Type 2 diabetes mellitus with hyperglycemia: Secondary | ICD-10-CM

## 2023-06-05 DIAGNOSIS — H40021 Open angle with borderline findings, high risk, right eye: Secondary | ICD-10-CM | POA: Diagnosis not present

## 2023-06-05 DIAGNOSIS — H43811 Vitreous degeneration, right eye: Secondary | ICD-10-CM | POA: Diagnosis not present

## 2023-06-05 LAB — HM DIABETES EYE EXAM

## 2023-06-05 MED ORDER — FREESTYLE LIBRE 2 SENSOR MISC
2.0000 | Freq: Every day | 12 refills | Status: DC
Start: 2023-06-05 — End: 2023-10-03

## 2023-06-05 NOTE — Addendum Note (Signed)
Addended byRicharda Blade C on: 06/05/2023 05:07 PM   Modules accepted: Orders

## 2023-06-05 NOTE — Telephone Encounter (Signed)
Left message on voicemail for patient to return call when available  questioning which machine they will cover. If patient doesn't know we can send in a generic one.   Dinah please advise on the Frequency for testing blood sugar as patient does not have a meter currently on her medication list

## 2023-06-05 NOTE — Addendum Note (Signed)
Addended by: Maurice Small on: 06/05/2023 11:55 AM   Modules accepted: Orders

## 2023-06-05 NOTE — Telephone Encounter (Addendum)
Patient returned call and stated that she is getting a new insurance and they will cover the freestyle libra 2 sensor and she needs a rx sent to [facility]

## 2023-06-14 DIAGNOSIS — E1169 Type 2 diabetes mellitus with other specified complication: Secondary | ICD-10-CM | POA: Diagnosis not present

## 2023-06-19 ENCOUNTER — Telehealth: Payer: Self-pay | Admitting: Family

## 2023-06-19 MED ORDER — BD PEN NEEDLE SHORT U/F 31G X 8 MM MISC: 1 refills | Status: AC

## 2023-06-19 NOTE — Telephone Encounter (Signed)
Pt sent rx request through my chart for needles refill was sent to pharmacy.

## 2023-06-20 ENCOUNTER — Ambulatory Visit: Payer: 59

## 2023-06-25 DIAGNOSIS — E119 Type 2 diabetes mellitus without complications: Secondary | ICD-10-CM | POA: Diagnosis not present

## 2023-06-25 DIAGNOSIS — L600 Ingrowing nail: Secondary | ICD-10-CM | POA: Diagnosis not present

## 2023-07-03 DIAGNOSIS — E1165 Type 2 diabetes mellitus with hyperglycemia: Secondary | ICD-10-CM

## 2023-07-03 MED ORDER — OZEMPIC (0.25 OR 0.5 MG/DOSE) 2 MG/3ML ~~LOC~~ SOPN
0.5000 mg | PEN_INJECTOR | SUBCUTANEOUS | 1 refills | Status: DC
Start: 2023-07-03 — End: 2023-10-09

## 2023-07-04 ENCOUNTER — Encounter: Payer: 59 | Admitting: Gastroenterology

## 2023-07-11 ENCOUNTER — Other Ambulatory Visit: Payer: Self-pay | Admitting: Adult Health

## 2023-07-11 DIAGNOSIS — E1165 Type 2 diabetes mellitus with hyperglycemia: Secondary | ICD-10-CM

## 2023-07-11 MED ORDER — LANTUS SOLOSTAR 100 UNIT/ML ~~LOC~~ SOPN: PEN_INJECTOR | SUBCUTANEOUS | Status: DC

## 2023-07-30 ENCOUNTER — Ambulatory Visit (INDEPENDENT_AMBULATORY_CARE_PROVIDER_SITE_OTHER): Payer: Medicare Other | Admitting: Family

## 2023-07-30 ENCOUNTER — Ambulatory Visit
Admission: RE | Admit: 2023-07-30 | Discharge: 2023-07-30 | Disposition: A | Discharge status: Home / Self Care | Payer: Medicare Other | Source: Ambulatory Visit | Attending: Family | Admitting: Family

## 2023-07-30 ENCOUNTER — Encounter: Payer: Self-pay | Admitting: Family

## 2023-07-30 VITALS — BP 136/82 | HR 61 | Temp 96.8°F | Resp 20 | Ht 68.0 in | Wt 280.8 lb

## 2023-07-30 DIAGNOSIS — K5901 Slow transit constipation: Secondary | ICD-10-CM | POA: Diagnosis not present

## 2023-07-30 DIAGNOSIS — G250 Essential tremor: Secondary | ICD-10-CM | POA: Diagnosis not present

## 2023-07-30 DIAGNOSIS — I10 Essential (primary) hypertension: Secondary | ICD-10-CM | POA: Diagnosis not present

## 2023-07-30 DIAGNOSIS — E1165 Type 2 diabetes mellitus with hyperglycemia: Secondary | ICD-10-CM | POA: Diagnosis not present

## 2023-07-30 DIAGNOSIS — F411 Generalized anxiety disorder: Secondary | ICD-10-CM

## 2023-07-30 DIAGNOSIS — E782 Mixed hyperlipidemia: Secondary | ICD-10-CM

## 2023-07-30 DIAGNOSIS — F5101 Primary insomnia: Secondary | ICD-10-CM

## 2023-07-30 MED ORDER — LOSARTAN POTASSIUM 50 MG PO TABS: 50.0000 mg | ORAL_TABLET | Freq: Every day | ORAL | 1 refills | Status: DC

## 2023-07-30 MED ORDER — LANTUS SOLOSTAR 100 UNIT/ML ~~LOC~~ SOPN: PEN_INJECTOR | SUBCUTANEOUS | 0 refills | Status: DC

## 2023-07-30 MED ORDER — TIMOLOL MALEATE 0.5 % OP SOLN: 1.0000 [drp] | Freq: Every morning | OPHTHALMIC | 3 refills | Status: DC

## 2023-07-30 MED ORDER — PROPRANOLOL HCL ER 120 MG PO CP24: 120.0000 mg | ORAL_CAPSULE | Freq: Every day | ORAL | 1 refills | Status: DC

## 2023-07-30 MED ORDER — FLEET ENEMA RE ENEM: 1.0000 | ENEMA | Freq: Every day | RECTAL | Status: DC | PRN

## 2023-07-30 MED ORDER — LATANOPROST 0.005 % OP SOLN: 1.0000 [drp] | Freq: Every day | OPHTHALMIC | 3 refills | Status: DC

## 2023-07-30 MED ORDER — TRAZODONE HCL 50 MG PO TABS: 50.0000 mg | ORAL_TABLET | Freq: Every evening | ORAL | 1 refills | Status: DC | PRN

## 2023-07-30 MED ORDER — ATORVASTATIN CALCIUM 40 MG PO TABS: 40.0000 mg | ORAL_TABLET | Freq: Every day | ORAL | 1 refills | Status: DC

## 2023-07-30 MED ORDER — HYDROCHLOROTHIAZIDE 25 MG PO TABS: 25.0000 mg | ORAL_TABLET | Freq: Every day | ORAL | 1 refills | Status: DC

## 2023-07-30 MED ORDER — BUSPIRONE HCL 7.5 MG PO TABS: 7.5000 mg | ORAL_TABLET | Freq: Three times a day (TID) | ORAL | 1 refills | Status: DC | PRN

## 2023-07-30 NOTE — Patient Instructions (Signed)
-   Please get abdominal X-ray at [facility] then will call you with results.

## 2023-07-30 NOTE — Progress Notes (Signed)
Provider: Richarda Blade FNP-C   Daci Stubbe, Donalee Citrin, NP  Patient Care Team: Markale Birdsell, Donalee Citrin, NP as PCP - General (Family Medicine) Nahser, Deloris Ping, MD as PCP - Cardiology (Cardiology) Tat, Octaviano Batty, DO as Consulting Physician (Neurology)  Extended Emergency Contact Information Primary Emergency Contact: Schwarzkopf,[name] Address: [address]          [place], [place] [address] [place] Home Phone: [phone] Mobile Phone: [phone] Relation: Spouse Secondary Emergency Contact: Durning,John Mobile Phone: [phone] Relation: Other  Code Status:  Full Code  Goals of care: Advanced Directive information    07/30/2023   11:10 AM  Advanced Directives  Does Patient Have a Medical Advance Directive? No  Would patient like information on creating a medical advance directive? No - Patient declined     Chief Complaint  Patient presents with   Medical Management of Chronic Issues    6 months follow up     HPI:  Pt is a 72 y.o. female seen today for 6 months follow up for medical management of chronic diseases.    Constipation - states ha been going 3-4 days without bowel movement. Has taken Miralax and docusate and Dulcolax without any relief.also used glycerin suppository.States unable to eat well due to nausea but no vomiting.Feels like abdomen is distended. States symptoms began prior to starting on Ozempic.   Type 2 DM - CBG runs in the 60's -140's.Discussed reducing Lantus to 10 units but states does not feel comfortable reducing lantus that low since CBG runs in th 60's occasionally. Prefers to reduce Lantus by 2 units every 2 weeks.  Has had diabetic eye exam.  Also follows up with podiatrist.she denies any numbness and tingling on the extremities.   Past Medical History:  Diagnosis Date   Allergy    Anxiety    Arthritis    Cataract    CHF (congestive heart failure) (HCC)    DM (diabetes mellitus) (HCC)    Essential tremor    head    G6PD deficiency    Glaucoma    Per new patient form   High cholesterol    Per new patient form   HTN (hypertension)    Hyperlipidemia    Sickle cell trait (HCC)    Past Surgical History:  Procedure Laterality Date   ABDOMINAL HYSTERECTOMY     Dr. Manson Passey / Per new patient form   ANKLE SURGERY Right    x 2   BREAST LUMPECTOMY Left    benign   DG  BONE DENSITY ([facility] HX)     Per new patient form   DIAGNOSTIC MAMMOGRAM     Per new patient form   EYE SURGERY Left    age 43 - L eye removed, Dr. Merilynn Finland / Per new patient form   MINOR RELEASE DORSAL COMPARTMENT (DEQUERVAINS) Left 05/09/2022   Procedure: Left dequervains release and left dorsal forearm mass excision;  Surgeon: Gomez Cleverly, MD;  Location: [facility];  Service: Orthopedics;  Laterality: Left;  Regional block   REDUCTION MAMMAPLASTY Bilateral    1998    Allergies  Allergen Reactions   Bee Venom Anaphylaxis   Beeswax Anaphylaxis    bees   Fish Allergy Anaphylaxis   Shellfish Allergy Anaphylaxis    And all seafood   Aspirin Other (See Comments)    G6P Deficiency   Egg-Derived Products Hives   Nsaids Other (See Comments)    G6P Deficiency   Sulfa Antibiotics  Other (See Comments)    G6P Deficiency     Allergies as of 07/30/2023       Reactions   Bee Venom Anaphylaxis   Beeswax Anaphylaxis   bees   Fish Allergy Anaphylaxis   Shellfish Allergy Anaphylaxis   And all seafood   Aspirin Other (See Comments)   G6P Deficiency   Egg-derived Products Hives   Nsaids Other (See Comments)   G6P Deficiency   Sulfa Antibiotics Other (See Comments)   G6P Deficiency         Medication List        Accurate as of July 30, 2023 12:22 PM. If you have any questions, ask your nurse or doctor.          acetaminophen 500 MG tablet Commonly known as: TYLENOL Take 2 tablets by mouth every 6 (six) hours as needed.   atorvastatin 40 MG tablet Commonly known as: LIPITOR Take 1 tablet (40 mg  total) by mouth at bedtime. What changed: See the new instructions. Changed by: Donalee Citrin Daine Gunther   B-D ULTRAFINE III SHORT PEN 31G X 8 MM Misc Generic drug: Insulin Pen Needle SMARTSIG:injection Twice Daily DX: E11.65   BETA CAROTENE PO Take 1 tablet by mouth daily with breakfast.   BIOTIN PO Take by mouth daily.   busPIRone 7.5 MG tablet Commonly known as: BUSPAR Take 1 tablet (7.5 mg total) by mouth 3 (three) times daily as needed. What changed:  when to take this reasons to take this Changed by: Eaton Folmar C Pamela Intrieri   docusate 50 MG/5ML liquid Commonly known as: COLACE Take by mouth as needed for mild constipation. Patient takes one or twice a week as needed for constipation   EPINEPHrine 0.3 mg/0.3 mL Soaj injection Commonly known as: EPI-PEN Inject 0.3 mg into the muscle as needed for anaphylaxis.   Fish Oil 1200 MG Caps Take 1,200 mg by mouth daily with breakfast.   FreeStyle Libre 2 Sensor Misc 2 Devices by Does not apply route daily. Ell.65   Hair/Skin/Nails/Biotin Tabs Take 1 tablet by mouth daily with breakfast.   hydrochlorothiazide 25 MG tablet Commonly known as: HYDRODIURIL Take 1 tablet (25 mg total) by mouth daily with breakfast.   Lantus SoloStar 100 UNIT/ML Solostar Pen Generic drug: insulin glargine INJECT 14 UNIT INTO THE SKIN IN THE MORNING What changed: additional instructions Changed by: Donalee Citrin Laurence Crofford   latanoprost 0.005 % ophthalmic solution Commonly known as: XALATAN Place 1 drop into the right eye at bedtime.   losartan 50 MG tablet Commonly known as: COZAAR Take 1 tablet (50 mg total) by mouth daily with breakfast.   Maxitrol 3.5-10000-0.1 Oint Generic drug: neomycin-polymyxin-dexameth Place into the right eye as needed. Apply to eye.   multivitamin with minerals Tabs tablet Take 1 tablet by mouth daily with breakfast.   Neuriva Caps Take 1 capsule by mouth daily with breakfast.   Ozempic (0.25 or 0.5 MG/DOSE) 2 MG/3ML  Sopn Generic drug: Semaglutide(0.25 or 0.5MG /DOS) Inject 0.5 mg into the skin once a week.   polyethylene glycol powder 17 GM/SCOOP powder Commonly known as: GLYCOLAX/MIRALAX Take 17 g and stir and dissolve in 4 to 8 ounces of liquid and drink it right away by mouth daily. Hold for loose stool.   propranolol ER 120 MG 24 hr capsule Commonly known as: INDERAL LA Take 1 capsule (120 mg total) by mouth at bedtime.   SODIUM FLUORIDE (DENTAL RINSE) 0.2 % Soln Place 1 application onto teeth daily as needed (to  strenghten teeth).   sodium phosphate Enem Place 133 mLs (1 enema total) rectally daily as needed for severe constipation. Started by: Donalee Citrin Rushi Chasen   timolol 0.5 % ophthalmic solution Commonly known as: TIMOPTIC Place 1 drop into the right eye every morning.   traZODone 50 MG tablet Commonly known as: DESYREL Take 1 tablet (50 mg total) by mouth at bedtime as needed for sleep. Takes maybe 3-4 times a month per pt   Zinc 50 MG Tabs Take by mouth.        Review of Systems  Constitutional:  Negative for appetite change, chills, fatigue, fever and unexpected weight change.  Eyes:  Negative for pain, discharge, redness, itching and visual disturbance.  Respiratory:  Negative for cough, chest tightness, shortness of breath and wheezing.   Cardiovascular:  Negative for chest pain, palpitations and leg swelling.  Gastrointestinal:  Positive for constipation and nausea. Negative for abdominal distention, abdominal pain, blood in stool, diarrhea and vomiting.       Bloating   Endocrine: Negative for cold intolerance, heat intolerance, polydipsia, polyphagia and polyuria.  Genitourinary:  Negative for difficulty urinating, dysuria, flank pain, frequency and urgency.  Musculoskeletal:  Negative for arthralgias, back pain, gait problem, joint swelling and myalgias.  Skin:  Negative for color change and pallor.  Neurological:  Negative for dizziness, speech difficulty, weakness,  light-headedness, numbness and headaches.    Immunization History  Administered Date(s) Administered   Fluad Trivalent(High Dose 65+) 05/22/2023   Influenza,trivalent, recombinat, inj, PF 08/07/2022   Moderna Covid-19 Vaccine Bivalent Booster 29yrs & up 05/22/2023   PFIZER(Purple Top)SARS-COV-2 Vaccination 09/14/2019, 10/22/2019, 06/26/2020, 01/19/2021, 06/21/2021, 06/14/2022   PNEUMOCOCCAL CONJUGATE-20 01/31/2022   Pfizer(Comirnaty)Fall Seasonal Vaccine 12 years and older 11/27/2022, 11/28/2022   Pneumococcal Polysaccharide-23 06/11/2020   Respiratory Syncytial Virus Vaccine,Recomb Aduvanted(Arexvy) 06/15/2022   Td 11/28/2022   Tdap 11/27/2022   Zoster Recombinant(Shingrix) 09/14/2017, 12/03/2017   Pertinent  Health Maintenance Due  Topic Date Due   OPHTHALMOLOGY EXAM  03/12/2023   HEMOGLOBIN A1C  08/10/2023   FOOT EXAM  02/29/2024   Colonoscopy  01/10/2025   MAMMOGRAM  03/20/2025   INFLUENZA VACCINE  Completed   DEXA SCAN  Completed      10/03/2022    2:25 PM 10/11/2022    1:37 PM 02/07/2023    8:45 AM 05/22/2023    2:50 PM 07/30/2023   11:07 AM  Fall Risk  Falls in the past year? 0 0 0 0 0  Was there an injury with Fall? 0 0 0  0  Fall Risk Category Calculator 0 0 0  0  Patient at Risk for Falls Due to No Fall Risks No Fall Risks No Fall Risks  No Fall Risks  Fall risk Follow up Falls evaluation completed Falls evaluation completed Falls evaluation completed     Functional Status Survey:    Vitals:   07/30/23 1121  BP: 136/82  Pulse: 61  Resp: 20  Temp: (!) 96.8 F (36 C)  SpO2: 96%  Weight: 280 lb 12.8 oz (127.4 kg)  Height: 5\' 8"  (1.727 m)   Body mass index is 42.7 kg/m. Physical Exam Vitals reviewed.  Constitutional:      General: She is not in acute distress.    Appearance: Normal appearance. She is obese. She is not ill-appearing or diaphoretic.  HENT:     Head: Normocephalic.     Right Ear: Tympanic membrane, ear canal and external ear normal.  There is no impacted cerumen.  Left Ear: Tympanic membrane, ear canal and external ear normal. There is no impacted cerumen.     Nose: Nose normal. No congestion or rhinorrhea.     Mouth/Throat:     Mouth: Mucous membranes are moist.     Pharynx: Oropharynx is clear. No oropharyngeal exudate or posterior oropharyngeal erythema.  Eyes:     General: No scleral icterus.       Right eye: No discharge.        Left eye: No discharge.     Extraocular Movements: Extraocular movements intact.     Conjunctiva/sclera: Conjunctivae normal.     Pupils: Pupils are equal, round, and reactive to light.  Neck:     Vascular: No carotid bruit.  Cardiovascular:     Rate and Rhythm: Normal rate and regular rhythm.     Pulses: Normal pulses.     Heart sounds: Normal heart sounds. No murmur heard.    No friction rub. No gallop.  Pulmonary:     Effort: Pulmonary effort is normal. No respiratory distress.     Breath sounds: Normal breath sounds. No wheezing, rhonchi or rales.  Chest:     Chest wall: No tenderness.  Abdominal:     General: Bowel sounds are normal. There is no distension.     Palpations: Abdomen is soft. There is no mass.     Tenderness: There is no abdominal tenderness. There is no right CVA tenderness, left CVA tenderness, guarding or rebound.  Musculoskeletal:        General: No swelling or tenderness. Normal range of motion.     Cervical back: Normal range of motion. No rigidity or tenderness.     Right lower leg: No edema.     Left lower leg: No edema.  Lymphadenopathy:     Cervical: No cervical adenopathy.  Skin:    General: Skin is warm and dry.     Coloration: Skin is not pale.     Findings: No bruising, erythema, lesion or rash.  Neurological:     Mental Status: She is alert and oriented to person, place, and time.     Cranial Nerves: No cranial nerve deficit.     Sensory: No sensory deficit.     Motor: No weakness.     Coordination: Coordination normal.     Gait: Gait  normal.  Psychiatric:        Mood and Affect: Mood normal.        Speech: Speech normal.        Behavior: Behavior normal.        Thought Content: Thought content normal.        Judgment: Judgment normal.     Labs reviewed: Recent Labs    08/07/22 0937 02/07/23 0936  NA 140 140  K 3.9 4.1  CL 105 104  CO2 25 28  GLUCOSE 144* 159*  BUN 11 15  CREATININE 0.71 0.85  CALCIUM 9.9 9.5   Recent Labs    08/07/22 0937 02/07/23 0936  AST 24 21  ALT 23 19  BILITOT 0.5 0.7  PROT 7.1 6.8   Recent Labs    08/07/22 0937 02/07/23 0936  WBC 7.6 6.7  NEUTROABS 3,800 3,357  HGB 14.0 13.4  HCT 41.7 41.0  MCV 97.2 96.7  PLT 197 199   Lab Results  Component Value Date   TSH 2.97 02/07/2023   Lab Results  Component Value Date   HGBA1C 6.3 (H) 02/07/2023   Lab Results  Component  Value Date   CHOL 114 02/07/2023   HDL 42 (L) 02/07/2023   LDLCALC 49 02/07/2023   TRIG 152 (H) 02/07/2023   CHOLHDL 2.7 02/07/2023    Significant Diagnostic Results in last 30 days:  No results found.  Assessment/Plan  1. Type 2 diabetes mellitus with hyperglycemia, without long-term current use of insulin (HCC) Lab Results  Component Value Date   HGBA1C 6.3 (H) 02/07/2023  - continue on Semaglutide - recommended to stop Lantus due to some hypoglycemia episodes but prefers to keep decreasing by 2 units  - Microalbumin / creatinine urine ratio - Lipid panel; Future - TSH; Future - COMPLETE METABOLIC PANEL WITH GFR; Future - CBC with Differential/Platelet; Future - Hemoglobin A1c; Future  2. Slow transit constipation Reports no bowel movement for 3-4 days despite use of stool softeners and suppository. -Abdomen nondistended, nontender, soft with bowels sound present x 4 quadrants - encouraged hydration  - Increase fiber in the diet  - Fleet enema  - will obtain KUB to rule out obstruction if normal recommend magnesium citrate - DG Abd 1 View  3. Primary hypertension B/p well  controlled  - continue dietary modification and exercise at least three times per week for 30 minutes.  -Continue on losartan, propranolol and hydrochlorothiazide - TSH; Future - COMPLETE METABOLIC PANEL WITH GFR; Future - CBC with Differential/Platelet; Future  4. Essential tremor Chronic -Continue on propranolol  5. Primary insomnia Continue on tramadol  6. Generalized anxiety disorder -Continue on buspirone  7. Mixed hyperlipidemia LDL at goal but triglycerides continue to be high.  Will consider fenofibrate if triglycerides worsen. Dietary modification and exercise advised. -Continue on atorvastatin - Lipid panel; Future  Family/ staff Communication: Reviewed plan of care with patient verbalized understanding  Labs/tests ordered:  - Microalbumin / creatinine urine ratio - Lipid panel; Future - TSH; Future - COMPLETE METABOLIC PANEL WITH GFR; Future - CBC with Differential/Platelet; Future - Hemoglobin A1c; Future - DG Abd 1 View  Next Appointment : Return in about 6 months (around 01/27/2024) for medical mangement of chronic issues.Fasring labs soon .   Caesar Bookman, NP

## 2023-07-31 ENCOUNTER — Telehealth: Payer: Self-pay | Admitting: Family

## 2023-07-31 LAB — MICROALBUMIN / CREATININE URINE RATIO
Creatinine, Urine: 87 mg/dL (ref 20–275)
Microalb Creat Ratio: 14 mg/g{creat} (ref <30)
Microalb, Ur: 1.2 mg/dL

## 2023-07-31 NOTE — Telephone Encounter (Signed)
Patient called in to inform Dinah that she would like a call as soon as her results from the Xray she had on her abdomen on 07/30/2023 come in she need a call back to discuss them please and also the medication that she was instructed to take.

## 2023-08-01 NOTE — Telephone Encounter (Signed)
Patient was advised and verbalized understanding. 

## 2023-08-01 NOTE — Telephone Encounter (Signed)
Still waiting for the Abdomen X-ray.If still no bowel movement recommend ED for further evaluation.

## 2023-08-02 ENCOUNTER — Other Ambulatory Visit: Payer: Self-pay

## 2023-08-02 ENCOUNTER — Ambulatory Visit: Payer: 59 | Admitting: Family

## 2023-08-02 DIAGNOSIS — I10 Essential (primary) hypertension: Secondary | ICD-10-CM

## 2023-08-02 DIAGNOSIS — F5101 Primary insomnia: Secondary | ICD-10-CM

## 2023-08-02 DIAGNOSIS — F411 Generalized anxiety disorder: Secondary | ICD-10-CM

## 2023-08-02 DIAGNOSIS — K5901 Slow transit constipation: Secondary | ICD-10-CM

## 2023-08-02 DIAGNOSIS — E1165 Type 2 diabetes mellitus with hyperglycemia: Secondary | ICD-10-CM

## 2023-08-02 DIAGNOSIS — G250 Essential tremor: Secondary | ICD-10-CM

## 2023-08-02 MED ORDER — LATANOPROST 0.005 % OP SOLN: 1.0000 [drp] | Freq: Every day | OPHTHALMIC | 3 refills | Status: DC

## 2023-08-02 MED ORDER — FLEET ENEMA RE ENEM: 1.0000 | ENEMA | Freq: Every day | RECTAL | 0 refills | Status: DC | PRN

## 2023-08-02 MED ORDER — TIMOLOL MALEATE 0.5 % OP SOLN: 1.0000 [drp] | Freq: Every morning | OPHTHALMIC | 3 refills | Status: AC

## 2023-08-02 MED ORDER — BUSPIRONE HCL 7.5 MG PO TABS: 7.5000 mg | ORAL_TABLET | Freq: Three times a day (TID) | ORAL | 1 refills | Status: DC | PRN

## 2023-08-02 MED ORDER — LANTUS SOLOSTAR 100 UNIT/ML ~~LOC~~ SOPN: PEN_INJECTOR | SUBCUTANEOUS | 0 refills | Status: DC

## 2023-08-02 MED ORDER — ATORVASTATIN CALCIUM 40 MG PO TABS: 40.0000 mg | ORAL_TABLET | Freq: Every day | ORAL | 1 refills | Status: DC

## 2023-08-02 MED ORDER — TRAZODONE HCL 50 MG PO TABS: 50.0000 mg | ORAL_TABLET | Freq: Every evening | ORAL | 1 refills | Status: DC | PRN

## 2023-08-02 MED ORDER — PROPRANOLOL HCL ER 120 MG PO CP24: 120.0000 mg | ORAL_CAPSULE | Freq: Every day | ORAL | 1 refills | Status: DC

## 2023-08-02 MED ORDER — LOSARTAN POTASSIUM 50 MG PO TABS: 50.0000 mg | ORAL_TABLET | Freq: Every day | ORAL | 1 refills | Status: DC

## 2023-08-02 MED ORDER — HYDROCHLOROTHIAZIDE 25 MG PO TABS: 25.0000 mg | ORAL_TABLET | Freq: Every day | ORAL | 1 refills | Status: DC

## 2023-08-06 ENCOUNTER — Telehealth: Payer: Self-pay

## 2023-08-06 ENCOUNTER — Other Ambulatory Visit: Payer: Self-pay | Admitting: Family

## 2023-08-06 DIAGNOSIS — I10 Essential (primary) hypertension: Secondary | ICD-10-CM

## 2023-08-06 DIAGNOSIS — F411 Generalized anxiety disorder: Secondary | ICD-10-CM

## 2023-08-06 DIAGNOSIS — F5101 Primary insomnia: Secondary | ICD-10-CM

## 2023-08-06 MED ORDER — TRAZODONE HCL 50 MG PO TABS: 50.0000 mg | ORAL_TABLET | Freq: Every evening | ORAL | 1 refills | Status: DC | PRN

## 2023-08-06 MED ORDER — BUSPIRONE HCL 7.5 MG PO TABS: 7.5000 mg | ORAL_TABLET | Freq: Three times a day (TID) | ORAL | 1 refills | Status: DC | PRN

## 2023-08-06 NOTE — Telephone Encounter (Signed)
Pharmacy comment: PATIENT HAS AN ALLERGY LISTED TO SULFA MEDICATIONS. PATIENTS WITH THIS  ALLERGY MAY NOT BE ABLE TO TOLERATE HYDROCHLOROTHIAZIDE. TO FILL AS  WRITTEN, CHOOSE OPTION BELOW. CANCEL ORDER IF PATIENT CANNOT TOLERATE  THIS MEDICATION. FILL HYDROCHLOROTHIAZIDE AS WRITTEN.

## 2023-08-06 NOTE — Telephone Encounter (Signed)
Patient states buspar and trazodone was sent to [facility] when it should've been sent to [facility] pharmacy.   I called [facility] and left a detailed message requesting that they discard refills approved on 08/02/23.

## 2023-08-15 ENCOUNTER — Telehealth: Payer: Self-pay

## 2023-08-15 NOTE — Telephone Encounter (Signed)
Xray results are back and available for patient to view online.  I called [facility] to see if patient would need a new referral or if she can call herself to schedule and I was told patient can call herself since she has been there before. Left message on voicemail for patient to return call when available , reason for call is to provide the information below   [facility]ology Department Contact Information  Phone Fax Address  [phone] [address] [place] [address]  Specialty

## 2023-08-15 NOTE — Telephone Encounter (Signed)
Patient called to state that she is almost out of her medications and is concerned about what to do. Below is the details of all concerns:  1.) We were suppose to cancel 2 rx's (buspar and trazodone) at [facility] on [facility]/06/[facility], however they continue to call her informing her she has refills for pick up  2.) Patient has not received rx's from [facility] yet and will be out in a couple of days.   3.) Would like a referral to GI due to ongoing abdominal issues and not xray results as of now (Xray was performed on 07/30/2023)   Action Taken:  1.) I called [facility], spoke with Angelique Blonder and she confirmed that buspar and trazodone was marked as inactive on 08/06/23. Angelique Blonder stated that she is not sure why patient is receiving text messages and automated calls to pick up rx's. Angelique Blonder was filling in at that location and recommended patient call back in the after noon to further discuss with manager.   2.) I called [facility], all rx's (Buspar, hydrochlorothiazide, and propranolol were shipped on the 2nd and expected arrival date is unknown.   3.) Radiology reading room was consulted, spoke with MJ and it was determined that they will expedite reading xray report.

## 2023-08-17 ENCOUNTER — Encounter: Payer: Medicare Other | Admitting: Family

## 2023-08-28 ENCOUNTER — Encounter: Payer: Self-pay | Admitting: Family

## 2023-08-28 NOTE — Telephone Encounter (Signed)
 Care team updated and letter sent for eye exam notes.

## 2023-09-07 ENCOUNTER — Telehealth: Payer: Self-pay

## 2023-09-07 ENCOUNTER — Ambulatory Visit (AMBULATORY_SURGERY_CENTER): Payer: Medicare Other

## 2023-09-07 VITALS — Ht 68.0 in | Wt 270.0 lb

## 2023-09-07 DIAGNOSIS — Z800 Family history of malignant neoplasm of digestive organs: Secondary | ICD-10-CM

## 2023-09-07 MED ORDER — NA SULFATE-K SULFATE-MG SULF 17.5-3.13-1.6 GM/177ML PO SOLN: 1.0000 | Freq: Once | ORAL | 0 refills | Status: AC

## 2023-09-07 NOTE — Telephone Encounter (Signed)
No answer, let another message

## 2023-09-07 NOTE — Telephone Encounter (Signed)
Left patient a message letting her know that I was trying to reach her for the pre visit

## 2023-09-07 NOTE — Telephone Encounter (Signed)
Completed pre visit and mailed instructions

## 2023-09-07 NOTE — Telephone Encounter (Signed)
Patient called and stated that she received a new phone and does not know how to work the settings yet. Patient also stated that if it was possible for you to return her call but from a different number. Patient mention that she believes her phone blocked this our number and that's the possible reason why the call will not go through. Patient best call back number is [phone]. Please advise.

## 2023-09-07 NOTE — Progress Notes (Signed)
No egg or soy allergy known to patient  No issues known to pt with past sedation with any surgeries or procedures Patient denies ever being told they had issues or difficulty with intubation  No FH of Malignant Hyperthermia Pt is not on diet pills Pt is not on  home 02  Pt is not on blood thinners  Pt denies has with constipation and takes colace or miralax No A fib or A flutter Have any cardiac testing pending--no Pt can ambulate independently Pt denies use of chewing tobacco Discussed diabetic I weight loss medication holds Discussed NSAID holds Checked BMI Pt instructed to use Single[employer]com or GoodRx for a price reduction on prep  Patient's chart reviewed by Cathlyn Parsons CNRA prior to previsit and patient appropriate for the [facility].  Pre visit completed and red dot placed by patient's name on their procedure day (on provider's schedule).

## 2023-09-12 HISTORY — PX: COLONOSCOPY: SHX174

## 2023-09-12 IMAGING — MG MM DIGITAL SCREENING BILAT W/ TOMO AND CAD
6 of 10 series · 6 of 30 positions shown · non-contrast
Comparison: Previous exam(s).

ACR Breast Density Category a: The breasts are almost entirely
fatty.

CLINICAL DATA: Screening. Per technologist report, the patient
initially denied any breast complaints. However, after the
conclusion of the screening mammogram, patient reported palpable
lump in the left breast.

EXAM:
DIGITAL SCREENING BILATERAL MAMMOGRAM WITH TOMOSYNTHESIS AND CAD
TECHNIQUE: Bilateral screening digital craniocaudal and mediolateral oblique
mammograms were obtained. Bilateral screening digital breast
tomosynthesis was performed. The images were evaluated with
computer-aided detection.

[R CC synth-2D]
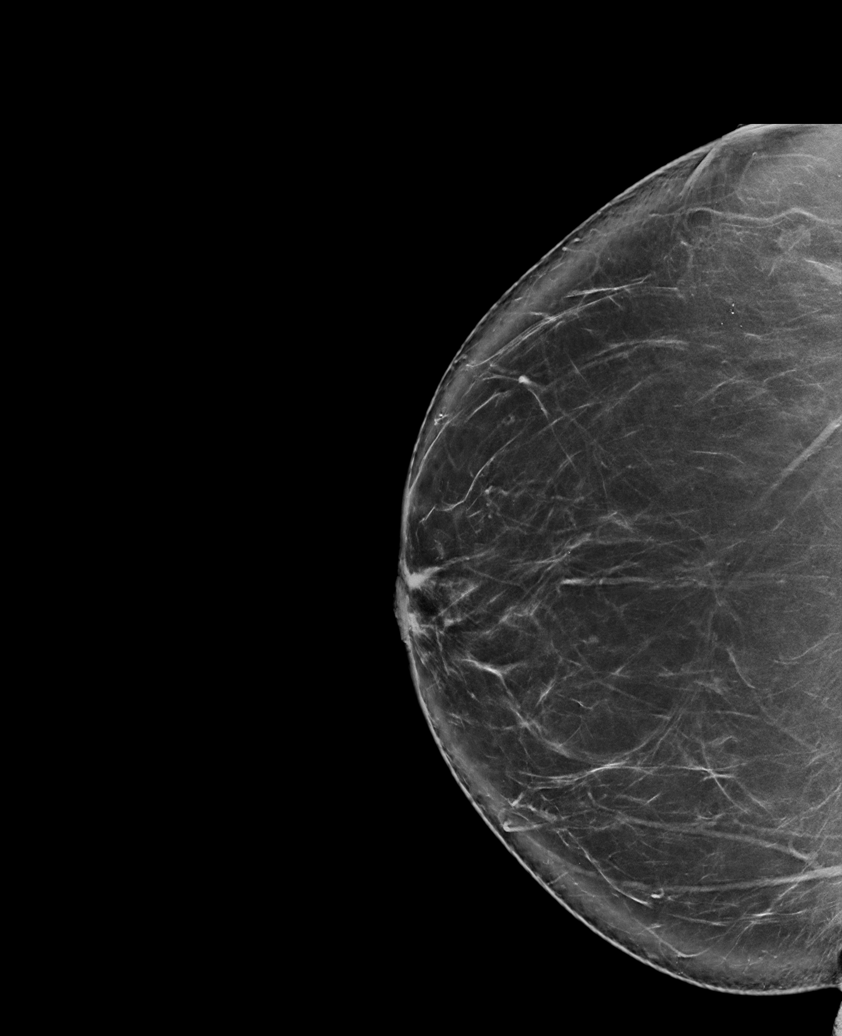

[L CC synth-2D (1 of 2)]
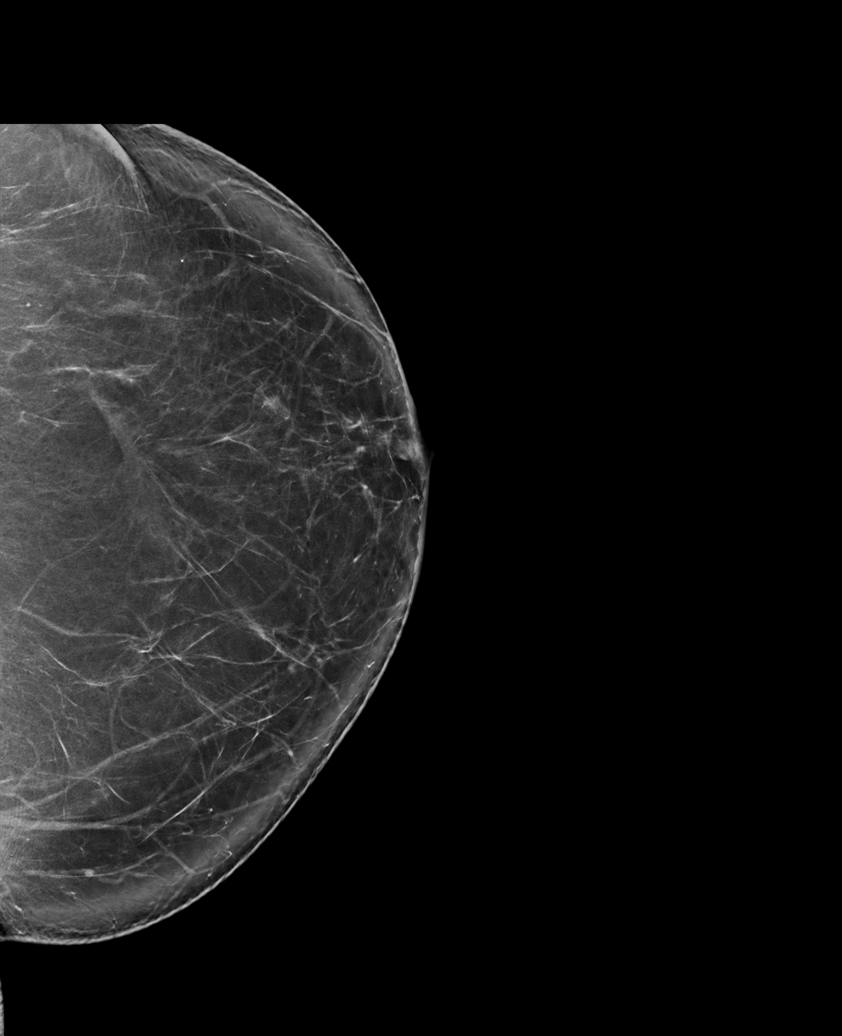

[R MLO synth-2D]
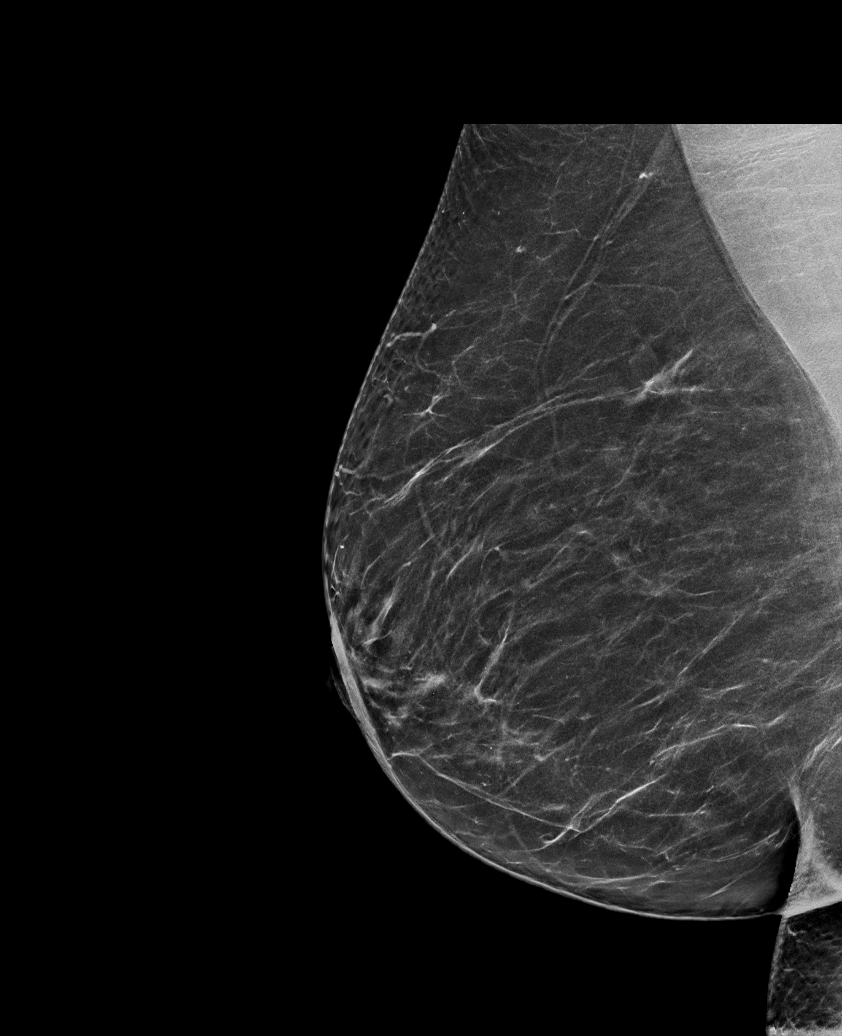

[L MLO synth-2D]
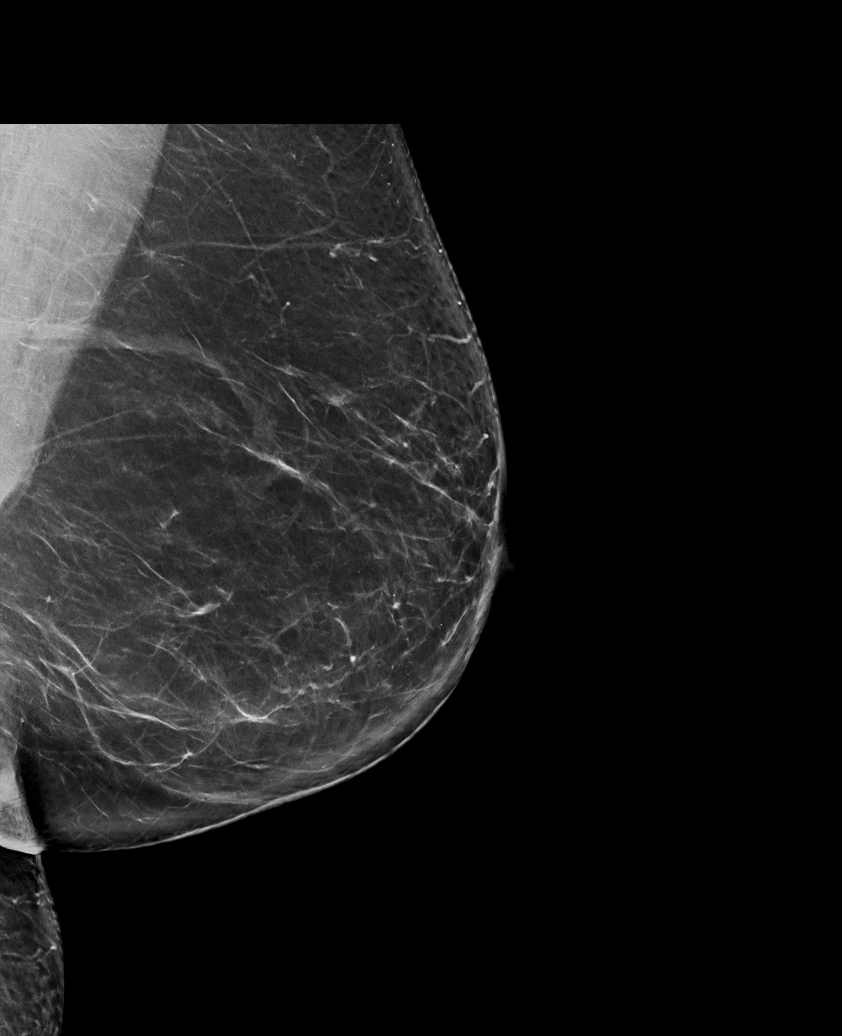

[L CC synth-2D (2 of 2)]
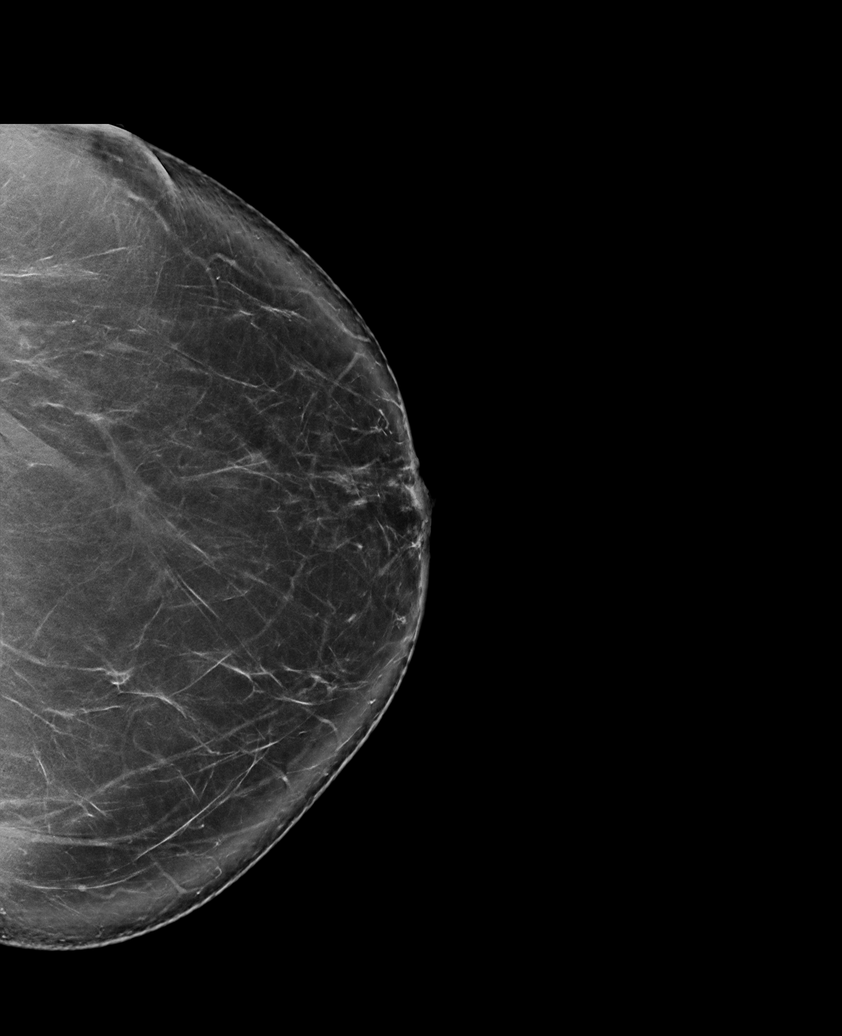

[R CC tomo · tomo slice 45/90.0]
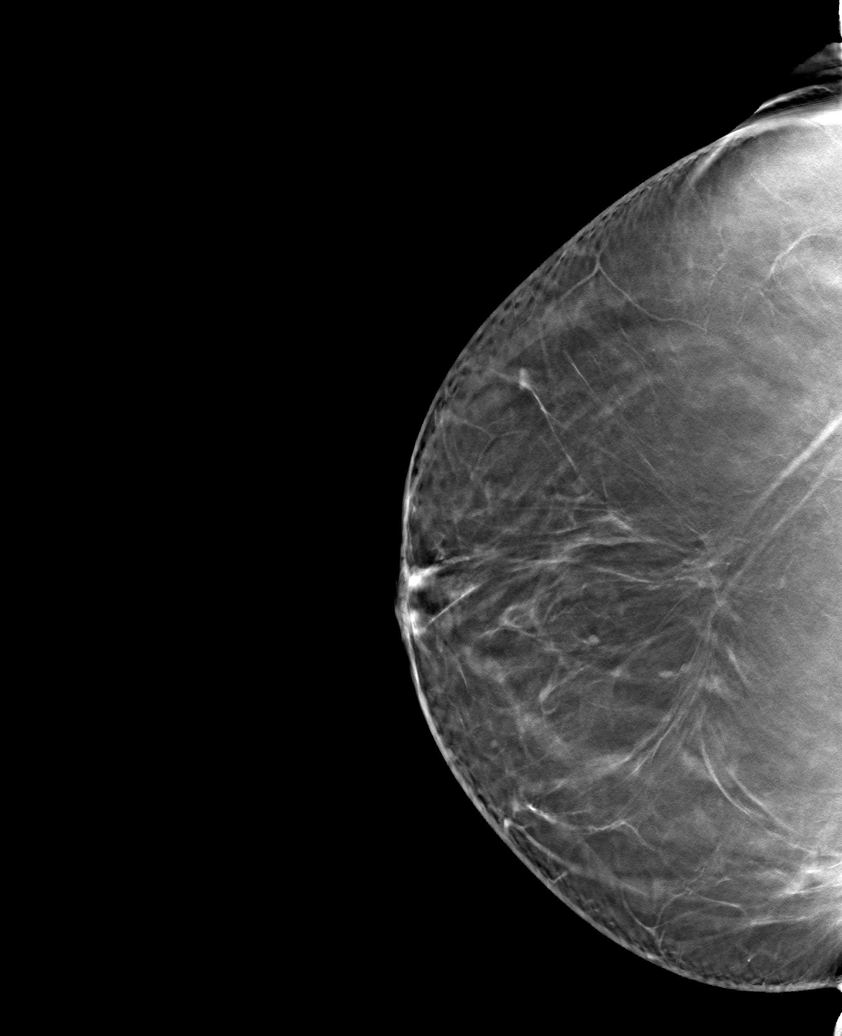

[6 of 30 positions shown; findings below may reference images not displayed]

FINDINGS: No findings suspicious for malignancy. The patient complains of a
palpable lump in the left breast. Clinical evaluation and diagnostic
left breast mammogram and left breast ultrasound is suggested.
IMPRESSION: No mammographic evidence of malignancy. However, follow-up is
suggested for evaluation of the patient's complaint of palpable lump
in the left breast.

RECOMMENDATION:
Clinical evaluation and diagnostic left breast mammogram and left
breast ultrasound.

BI-RADS CATEGORY  0: Incomplete. Need additional imaging evaluation
and/or prior mammograms for comparison

## 2023-09-14 ENCOUNTER — Telehealth: Payer: Self-pay | Admitting: Gastroenterology

## 2023-09-14 NOTE — Telephone Encounter (Signed)
 Inbound call from patient requesting a call to discuss coverage for 1/13 colonoscopy. Please advise, thank you.

## 2023-09-14 NOTE — Telephone Encounter (Signed)
 Inbound call from patient stating she is unable to see prep instructions in mychart. Advised patient that instructions have been resent and should be able to be found under letters in mychart. Patient states she still did not see them and requesting a call back. Please advise, thank you.

## 2023-09-14 NOTE — Telephone Encounter (Signed)
 Prep instructions have been sent again. Offered to assist patient with how to navigate there from mychart acct. Pt declined help. Will call if she is unable to find.

## 2023-09-18 ENCOUNTER — Telehealth: Payer: Self-pay

## 2023-09-18 MED ORDER — NA SULFATE-K SULFATE-MG SULF 17.5-3.13-1.6 GM/177ML PO SOLN: 1.0000 | Freq: Once | ORAL | 0 refills | Status: AC

## 2023-09-18 NOTE — Telephone Encounter (Signed)
 PT's Suprep was sent to [place] but it needs to go to [facility] on [address]. Please advise.

## 2023-09-18 NOTE — Telephone Encounter (Signed)
 Message left on clinical intake voicemail:   Patient needs rx for colonoscopy prep sent to [facility] on [address]. I returned call to patient and left a detailed message informing her to contact the gastroenterologist who is going to perform colonoscopy to make request.

## 2023-09-18 NOTE — Addendum Note (Signed)
 Addended by: Terressa Koyanagi E on: 09/18/2023 11:40 AM   Modules accepted: Orders

## 2023-09-24 ENCOUNTER — Encounter: Payer: Self-pay | Admitting: Gastroenterology

## 2023-09-24 ENCOUNTER — Telehealth: Payer: Self-pay | Admitting: Gastroenterology

## 2023-09-24 ENCOUNTER — Ambulatory Visit (AMBULATORY_SURGERY_CENTER): Payer: Medicare Other | Admitting: Gastroenterology

## 2023-09-24 VITALS — BP 128/90 | HR 55 | Temp 97.1°F | Resp 16 | Ht 68.0 in | Wt 270.0 lb

## 2023-09-24 DIAGNOSIS — K641 Second degree hemorrhoids: Secondary | ICD-10-CM

## 2023-09-24 DIAGNOSIS — Z1211 Encounter for screening for malignant neoplasm of colon: Secondary | ICD-10-CM | POA: Diagnosis not present

## 2023-09-24 DIAGNOSIS — Z800 Family history of malignant neoplasm of digestive organs: Secondary | ICD-10-CM

## 2023-09-24 DIAGNOSIS — K5730 Diverticulosis of large intestine without perforation or abscess without bleeding: Secondary | ICD-10-CM

## 2023-09-24 DIAGNOSIS — K644 Residual hemorrhoidal skin tags: Secondary | ICD-10-CM

## 2023-09-24 MED ORDER — SODIUM CHLORIDE 0.9 % IV SOLN: 500.0000 mL | Freq: Once | INTRAVENOUS | Status: DC

## 2023-09-24 NOTE — Progress Notes (Signed)
 To pacu, VSS. Report to Rn.tb

## 2023-09-24 NOTE — Patient Instructions (Signed)
 Resume previous diet and medications - repeat colonoscopy in 5 years.  YOU HAD AN ENDOSCOPIC PROCEDURE TODAY AT THE [facility]:   Refer to the procedure report that was given to you for any specific questions about what was found during the examination.  If the procedure report does not answer your questions, please call your gastroenterologist to clarify.  If you requested that your care partner not be given the details of your procedure findings, then the procedure report has been included in a sealed envelope for you to review at your convenience later.  YOU SHOULD EXPECT: Some feelings of bloating in the abdomen. Passage of more gas than usual.  Walking can help get rid of the air that was put into your GI tract during the procedure and reduce the bloating. If you had a lower endoscopy (such as a colonoscopy or flexible sigmoidoscopy) you may notice spotting of blood in your stool or on the toilet paper. If you underwent a bowel prep for your procedure, you may not have a normal bowel movement for a few days.  Please Note:  You might notice some irritation and congestion in your nose or some drainage.  This is from the oxygen used during your procedure.  There is no need for concern and it should clear up in a day or so.  SYMPTOMS TO REPORT IMMEDIATELY:  Following lower endoscopy (colonoscopy or flexible sigmoidoscopy):  Excessive amounts of blood in the stool  Significant tenderness or worsening of abdominal pains  Swelling of the abdomen that is new, acute  Fever of 100F or higher  For urgent or emergent issues, a gastroenterologist can be reached at any hour by calling [phone]. Do not use MyChart messaging for urgent concerns.    DIET:  We do recommend a small meal at first, but then you may proceed to your regular diet.  Drink plenty of fluids but you should avoid alcoholic beverages for 24 hours.  ACTIVITY:  You should plan to take it easy for the rest of today  and you should NOT DRIVE or use heavy machinery until tomorrow (because of the sedation medicines used during the test).    FOLLOW UP: Our staff will call the number listed on your records the next business day following your procedure.  We will call around 7:15- 8:00 am to check on you and address any questions or concerns that you may have regarding the information given to you following your procedure. If we do not reach you, we will leave a message.     If any biopsies were taken you will be contacted by phone or by letter within the next 1-3 weeks.  Please call us  at [phone] if you have not heard about the biopsies in 3 weeks.    SIGNATURES/CONFIDENTIALITY: You and/or your care partner have signed paperwork which will be entered into your electronic medical record.  These signatures attest to the fact that that the information above on your After Visit Summary has been reviewed and is understood.  Full responsibility of the confidentiality of this discharge information lies with you and/or your care-partner.

## 2023-09-24 NOTE — Progress Notes (Signed)
 Pt's states no medical or surgical changes since previsit or office visit.

## 2023-09-24 NOTE — Telephone Encounter (Signed)
 Returned pt's call. She is concerned that after getting up from a nap she had an episode of watery, brown stool with incontinence. States she has never had this before and is concerned. Colonoscopy uneventful. No blood noted or other symptoms to report. Instructed that pt may have had residual effects of the bowel prep after eating a meal. Instructed to continue to monitor and if she continues to have loose BM's (more than 3) and if she develops any other symptoms including a fever or abdominal pain to call back for advice. Pt verbalized understanding.

## 2023-09-24 NOTE — Telephone Encounter (Signed)
 Patient called stated she had a procedure this morning, woke up from a nap and states a large amount of dark brown liquid came out. Is seeking advice.

## 2023-09-24 NOTE — Progress Notes (Signed)
 GASTROENTEROLOGY PROCEDURE H&P NOTE   Primary Care Physician: Ngetich, Roxan BROCKS, NP    Reason for Procedure:  Colon cancer screening  Plan:    Colonoscopy  Patient is appropriate for endoscopic procedure(s) in the ambulatory ([facility]) setting.  The nature of the procedure, as well as the risks, benefits, and alternatives were carefully and thoroughly reviewed with the patient. Ample time for discussion and questions allowed. The patient understood, was satisfied, and agreed to proceed.     HPI: Wendy Obrien is a 73 y.o. female who presents for colonoscopy for ongoing CRC screening. Fhx notable for father with colon cancer, along with paternal aunt x2 and paternal cousin x2 with colon cancer.   Patient is otherwise without complaints or active issues today.  Last colonoscopy was 01/2015 and notable for diverticulosis, no polyps, recommended to repeat in 5 years due to family history.   Endoscopic Hx: - 2006: Colonoscopy: Normal  - 11/2009: Colonoscopy: Diverticulosis, no polyps  - 12/2014: Diverticulitis, treated with Cipro/Flagyl.  - 01/11/2015: Colonoscopy: Diverticulosis, no polyps.  Repeat in 5 years due to family history   Past Medical History:  Diagnosis Date   Allergy    Anxiety    Arthritis    Cataract    CHF (congestive heart failure) (HCC)    DM (diabetes mellitus) (HCC)    Essential tremor    head   G6PD deficiency    Glaucoma    Per new patient form   High cholesterol    Per new patient form   HTN (hypertension)    Hyperlipidemia    Sickle cell trait (HCC)     Past Surgical History:  Procedure Laterality Date   ABDOMINAL HYSTERECTOMY     Dr. Delores / Per new patient form   ANKLE SURGERY Right    x 2   BREAST LUMPECTOMY Left    benign   DG  BONE DENSITY ([facility] HX)     Per new patient form   DIAGNOSTIC MAMMOGRAM     Per new patient form   EYE SURGERY Left    age 1 - L eye removed, Dr. Ayesha Gentry / Per new patient form   MINOR RELEASE DORSAL  COMPARTMENT (DEQUERVAINS) Left 05/09/2022   Procedure: Left dequervains release and left dorsal forearm mass excision;  Surgeon: Alyse Agent, MD;  Location: [facility];  Service: Orthopedics;  Laterality: Left;  Regional block   REDUCTION MAMMAPLASTY Bilateral    1998    Prior to Admission medications   Medication Sig Start Date End Date Taking? Authorizing Provider  atorvastatin  (LIPITOR) 40 MG tablet Take 1 tablet (40 mg total) by mouth at bedtime. 08/02/23  Yes Ngetich, Dinah C, NP  B-D ULTRAFINE III SHORT PEN 31G X 8 MM MISC SMARTSIG:injection Twice Daily DX: E11.65 06/19/23  Yes Ngetich, Dinah C, NP  BETA CAROTENE PO Take 1 tablet by mouth daily with breakfast.   Yes [provider]  BIOTIN PO Take by mouth daily.   Yes [provider]  busPIRone  (BUSPAR ) 7.5 MG tablet Take 1 tablet (7.5 mg total) by mouth 3 (three) times daily as needed. 08/06/23  Yes Ngetich, Dinah C, NP  Continuous Glucose Sensor (FREESTYLE LIBRE 2 SENSOR) MISC 2 Devices by Does not apply route daily. Ell.65 06/05/23  Yes Ngetich, Dinah C, NP  docusate (COLACE) 50 MG/5ML liquid Take by mouth as needed for mild constipation. Patient takes one or twice a week as needed for constipation   Yes [provider]  hydrochlorothiazide  (HYDRODIURIL ) 25 MG tablet Take 1 tablet (25 mg total) by mouth daily with breakfast. (FILL AS  WRITTEN. PATIENT REQUIRES AND TOLERATES THIS MEDICATION AND WILL BE  APPROPRIATELY MONITORED) 08/06/23  Yes Ngetich, Dinah C, NP  insulin  glargine (LANTUS  SOLOSTAR) 100 UNIT/ML Solostar Pen INJECT 14 UNIT INTO THE SKIN IN THE MORNING 08/02/23  Yes Ngetich, Dinah C, NP  latanoprost  (XALATAN ) 0.005 % ophthalmic solution Place 1 drop into the right eye at bedtime. 08/02/23  Yes Ngetich, Dinah C, NP  losartan  (COZAAR ) 50 MG tablet Take 1 tablet (50 mg total) by mouth daily with breakfast. 08/02/23  Yes Ngetich, Dinah C, NP  Misc Natural Products (NEURIVA) CAPS Take 1  capsule by mouth daily with breakfast.   Yes [provider]  Multiple Vitamin (MULTIVITAMIN WITH MINERALS) TABS tablet Take 1 tablet by mouth daily with breakfast.   Yes [provider]  Omega-3 Fatty Acids (FISH OIL) 1200 MG CAPS Take 1,200 mg by mouth daily with breakfast.   Yes [provider]  polyethylene glycol powder (GLYCOLAX /MIRALAX ) 17 GM/SCOOP powder Take 17 g and stir and dissolve in 4 to 8 ounces of liquid and drink it right away by mouth daily. Hold for loose stool. 02/07/23  Yes Ngetich, Dinah C, NP  propranolol  ER (INDERAL  LA) 120 MG 24 hr capsule Take 1 capsule (120 mg total) by mouth at bedtime. 08/02/23  Yes Ngetich, Dinah C, NP  SODIUM FLUORIDE, DENTAL RINSE, 0.2 % SOLN Place 1 application onto teeth daily as needed (to strenghten teeth). 07/13/20  Yes [provider]  timolol  (TIMOPTIC ) 0.5 % ophthalmic solution Place 1 drop into the right eye every morning. 08/02/23  Yes Ngetich, Dinah C, NP  traZODone  (DESYREL ) 50 MG tablet Take 1 tablet (50 mg total) by mouth at bedtime as needed for sleep. Takes maybe 3-4 times a month per pt 08/06/23  Yes Ngetich, Dinah C, NP  Zinc  50 MG TABS Take by mouth.   Yes [provider]  acetaminophen  (TYLENOL ) 500 MG tablet Take 2 tablets by mouth every 6 (six) hours as needed.    [provider]  EPINEPHrine  0.3 mg/0.3 mL IJ SOAJ injection Inject 0.3 mg into the muscle as needed for anaphylaxis. 10/10/22   Ngetich, Dinah C, NP  MAXITROL 3.5-10000-0.1 OINT Place into the right eye as needed. Apply to eye. 05/05/22   [provider]  Multiple Vitamins-Minerals (HAIR/SKIN/NAILS/BIOTIN) TABS Take 1 tablet by mouth daily with breakfast. Patient not taking: Reported on 09/24/2023    [provider]  Semaglutide ,0.25 or 0.5MG /DOS, (OZEMPIC , 0.25 OR 0.5 MG/DOSE,) 2 MG/3ML SOPN Inject 0.5 mg into the skin once a week. 07/03/23   Ngetich, Dinah C, NP  sodium phosphate  (FLEET) ENEM Place 133  mLs (1 enema total) rectally daily as needed for severe constipation. Patient not taking: Reported on 09/07/2023 08/02/23   Ngetich, Dinah C, NP    Current Outpatient Medications  Medication Sig Dispense Refill   atorvastatin  (LIPITOR) 40 MG tablet Take 1 tablet (40 mg total) by mouth at bedtime. 90 tablet 1   B-D ULTRAFINE III SHORT PEN 31G X 8 MM MISC SMARTSIG:injection Twice Daily DX: E11.65 100 each 1   BETA CAROTENE PO Take 1 tablet by mouth daily with breakfast.     BIOTIN PO Take by mouth daily.     busPIRone  (BUSPAR ) 7.5 MG tablet Take 1 tablet (7.5 mg total) by mouth 3 (three) times daily as needed. 90 tablet 1  Continuous Glucose Sensor (FREESTYLE LIBRE 2 SENSOR) MISC 2 Devices by Does not apply route daily. Ell.65 2 each 12   docusate (COLACE) 50 MG/5ML liquid Take by mouth as needed for mild constipation. Patient takes one or twice a week as needed for constipation     hydrochlorothiazide  (HYDRODIURIL ) 25 MG tablet Take 1 tablet (25 mg total) by mouth daily with breakfast. (FILL AS  WRITTEN. PATIENT REQUIRES AND TOLERATES THIS MEDICATION AND WILL BE  APPROPRIATELY MONITORED) 90 tablet 2   insulin  glargine (LANTUS  SOLOSTAR) 100 UNIT/ML Solostar Pen INJECT 14 UNIT INTO THE SKIN IN THE MORNING 15 mL 0   latanoprost  (XALATAN ) 0.005 % ophthalmic solution Place 1 drop into the right eye at bedtime. 2.5 mL 3   losartan  (COZAAR ) 50 MG tablet Take 1 tablet (50 mg total) by mouth daily with breakfast. 90 tablet 1   Misc Natural Products (NEURIVA) CAPS Take 1 capsule by mouth daily with breakfast.     Multiple Vitamin (MULTIVITAMIN WITH MINERALS) TABS tablet Take 1 tablet by mouth daily with breakfast.     Omega-3 Fatty Acids (FISH OIL) 1200 MG CAPS Take 1,200 mg by mouth daily with breakfast.     polyethylene glycol powder (GLYCOLAX /MIRALAX ) 17 GM/SCOOP powder Take 17 g and stir and dissolve in 4 to 8 ounces of liquid and drink it right away by mouth daily. Hold for loose stool. 3350 g 1    propranolol  ER (INDERAL  LA) 120 MG 24 hr capsule Take 1 capsule (120 mg total) by mouth at bedtime. 90 capsule 1   SODIUM FLUORIDE, DENTAL RINSE, 0.2 % SOLN Place 1 application onto teeth daily as needed (to strenghten teeth).     timolol  (TIMOPTIC ) 0.5 % ophthalmic solution Place 1 drop into the right eye every morning. 10 mL 3   traZODone  (DESYREL ) 50 MG tablet Take 1 tablet (50 mg total) by mouth at bedtime as needed for sleep. Takes maybe 3-4 times a month per pt 90 tablet 1   Zinc  50 MG TABS Take by mouth.     acetaminophen  (TYLENOL ) 500 MG tablet Take 2 tablets by mouth every 6 (six) hours as needed.     EPINEPHrine  0.3 mg/0.3 mL IJ SOAJ injection Inject 0.3 mg into the muscle as needed for anaphylaxis. 1 each 11   MAXITROL 3.5-10000-0.1 OINT Place into the right eye as needed. Apply to eye.     Multiple Vitamins-Minerals (HAIR/SKIN/NAILS/BIOTIN) TABS Take 1 tablet by mouth daily with breakfast. (Patient not taking: Reported on 09/24/2023)     Semaglutide ,0.25 or 0.5MG /DOS, (OZEMPIC , 0.25 OR 0.5 MG/DOSE,) 2 MG/3ML SOPN Inject 0.5 mg into the skin once a week. 9 mL 1   sodium phosphate  (FLEET) ENEM Place 133 mLs (1 enema total) rectally daily as needed for severe constipation. (Patient not taking: Reported on 09/07/2023) 133 mL 0   Current Facility-Administered Medications  Medication Dose Route Frequency Provider Last Rate Last Admin   0.9 %  sodium chloride  infusion  500 mL Intravenous Once Kynslie Ringle V, DO        Allergies as of 09/24/2023 - Review Complete 09/24/2023  Allergen Reaction Noted   Bee venom Anaphylaxis 03/19/2021   Beeswax Anaphylaxis 07/25/2019   Fish allergy Anaphylaxis 03/19/2021   Shellfish allergy Anaphylaxis 07/25/2019   Egg-derived products Hives 07/25/2019   Aspirin  Other (See Comments) 07/25/2019   Nsaids Other (See Comments) 07/25/2019   Sulfa antibiotics Other (See Comments) 07/25/2019    Family History  Problem Relation Age of Onset  Lung cancer  Mother    Hypertension Mother    Diabetes Mother    Hypertension Father    Colon cancer Father    Sudden Cardiac Death Father    Colon polyps Sister    Colon polyps Sister    HIV Brother    Colon polyps Brother    HIV Brother    Stomach cancer Maternal Aunt    Colon cancer Paternal Aunt    Stomach cancer Paternal Uncle    Colon cancer Paternal Uncle    Arthritis Maternal Grandmother    Heart attack Maternal Grandfather    Colon cancer Other    Esophageal cancer Neg Hx    Rectal cancer Neg Hx     Social History   Socioeconomic History   Marital status: Married    Spouse name: Not on file   Number of children: Not on file   Years of education: Not on file   Highest education level: Not on file  Occupational History   Not on file  Tobacco Use   Smoking status: Former    Current packs/day: 0.00    Average packs/day: 0.2 packs/day for 30.0 years (6.0 ttl pk-yrs)    Types: Cigarettes    Start date: 48    Quit date: 1998    Years since quitting: 27.0   Smokeless tobacco: Never  Vaping Use   Vaping status: Never Used  Substance and Sexual Activity   Alcohol use: Not Currently   Drug use: Never   Sexual activity: Yes  Other Topics Concern   Not on file  Social History Narrative   Diet: Did not answer      Caffeine: Yes      Married, if yes what year: Married, 2010      Do you live in a house, apartment, assisted living, condo, trailer, ect: Condo      Is it one or more stories: Two      How many persons live in your home? 1      Pets: No      Highest level or education completed: Associate degree      Current/Past profession: Did not answer      Exercise:   No               Type and how often:          Living Will: No   DNR: No    POA/HPOA: No      Functional Status:   Do you have difficulty bathing or dressing yourself? No   Do you have difficulty preparing food or eating? No    Do you have difficulty managing your medications? No    Do you  have difficulty managing your finances? No    Do you have difficulty affording your medications? No   Social Drivers of Corporate Investment Banker Strain: Not on file  Food Insecurity: Not on file  Transportation Needs: Not on file  Physical Activity: Not on file  Stress: Not on file  Social Connections: Not on file  Intimate Partner Violence: Not on file    Physical Exam: Vital signs in last 24 hours: @BP  (!) 148/78   Pulse 64   Temp (!) 97.1 F (36.2 C)   Ht 5' 8 (1.727 m)   Wt 270 lb (122.5 kg)   SpO2 96%   BMI 41.05 kg/m  GEN: NAD EYE: Sclerae anicteric ENT: MMM CV: Non-tachycardic Pulm: CTA b/l GI: Soft, NT/ND NEURO:  Alert & Oriented x 3   Sandor Flatter, DO [facility]   09/24/2023 7:58 AM

## 2023-09-24 NOTE — Op Note (Signed)
 [facility] Patient Name: Wendy Obrien Procedure Date: 09/24/2023 8:01 AM MRN: [id number] Endoscopist: Sandor Flatter , MD, [id number] Age: 73 Referring MD:  Date of Birth: May 21, 1951 Gender: Female Account #: [id number] Procedure:                Colonoscopy Indications:              Screening in patient at increased risk: Family                            history of 1st-degree relative with colorectal                            cancer (father) and multiple 2nd-degree relatives.                           Last colonoscopy was 01/2015 and notable for                            diverticulosis, no polyps, recommended to repeat in                            5 years due to family history. Medicines:                Monitored Anesthesia Care Procedure:                Pre-Anesthesia Assessment:                           - Prior to the procedure, a History and Physical                            was performed, and patient medications and                            allergies were reviewed. The patient's tolerance of                            previous anesthesia was also reviewed. The risks                            and benefits of the procedure and the sedation                            options and risks were discussed with the patient.                            All questions were answered, and informed consent                            was obtained. Prior Anticoagulants: The patient has                            taken no anticoagulant or antiplatelet agents. ASA  Grade Assessment: III - A patient with severe                            systemic disease. After reviewing the risks and                            benefits, the patient was deemed in satisfactory                            condition to undergo the procedure.                           After obtaining informed consent, the colonoscope                            was passed under direct  vision. Throughout the                            procedure, the patient's blood pressure, pulse, and                            oxygen saturations were monitored continuously. The                            CF [id number] #[id number] was introduced through the anus                            and advanced to the the terminal ileum. The                            colonoscopy was performed without difficulty. The                            patient tolerated the procedure well. The quality                            of the bowel preparation was good. The terminal                            ileum, ileocecal valve, appendiceal orifice, and                            rectum were photographed. Scope In: 8:06:47 AM Scope Out: 8:21:36 AM Scope Withdrawal Time: 0 hours 10 minutes 44 seconds  Total Procedure Duration: 0 hours 14 minutes 49 seconds  Findings:                 Skin tags were found on perianal exam.                           Multiple medium-mouthed and small-mouthed                            diverticula were found in the sigmoid colon and  descending colon.                           Non-bleeding internal hemorrhoids were found during                            retroflexion. The hemorrhoids were small and Grade                            II (internal hemorrhoids that prolapse but reduce                            spontaneously).                           The terminal ileum appeared normal. Complications:            No immediate complications. Estimated Blood Loss:     Estimated blood loss: none. Impression:               - Perianal skin tags found on perianal exam.                           - Diverticulosis in the sigmoid colon and in the                            descending colon.                           - Non-bleeding internal hemorrhoids.                           - The examined portion of the ileum was normal.                           - No specimens  collected. Recommendation:           - Patient has a contact number available for                            emergencies. The signs and symptoms of potential                            delayed complications were discussed with the                            patient. Return to normal activities tomorrow.                            Written discharge instructions were provided to the                            patient.                           - Resume previous diet.                           -  Continue present medications.                           - Repeat colonoscopy in 5 years for screening                            purposes.                           - Return to GI office PRN. Sandor Flatter, MD 09/24/2023 8:29:09 AM

## 2023-09-25 ENCOUNTER — Telehealth: Payer: Self-pay

## 2023-09-25 NOTE — Telephone Encounter (Signed)
  Follow up Call-     09/24/2023    7:38 AM  Call back number  Post procedure Call Back phone  # [phone]  Permission to leave phone message Yes     Patient questions:  Do you have a fever, pain , or abdominal swelling? No. Pain Score  0 *  Have you tolerated food without any problems? Yes.    Have you been able to return to your normal activities? Yes.    Do you have any questions about your discharge instructions: Diet   No. Medications  No. Follow up visit  No.  Do you have questions or concerns about your Care? No.  Actions: * If pain score is 4 or above: No action needed, pain <4.

## 2023-10-03 ENCOUNTER — Other Ambulatory Visit: Payer: Self-pay

## 2023-10-03 DIAGNOSIS — E1165 Type 2 diabetes mellitus with hyperglycemia: Secondary | ICD-10-CM

## 2023-10-03 MED ORDER — FREESTYLE LIBRE 2 SENSOR MISC: 2.0000 | Freq: Every day | 12 refills | Status: DC

## 2023-10-03 NOTE — Telephone Encounter (Signed)
Patient called to say she called last week to express that she needs approval for her Freestyle sensor, as of today she called [employer], and they have not heard anything from [place]. I was unable to locate a call from last week, however offered to resolve concerns today.  I informed patient that in September 2024 we approved Freestyle [place] 2 sensors with a year supply. Patient aware I will call [employer] and circle back with her.  Outgoing call placed to [employer], RX last shipped 06/13/23 and they did not receive a call from patient indicating she needed another shipment. I asked if they could enroll patient in automatic refills and was told patient would need to call herself to set this up, I was also told patient needs to call to verify insurance at [phone]. [employer] representative states she will ship at this time as prompting from my phone call.   Call returned to patient and she plans to call [employer] today.

## 2023-10-03 NOTE — Telephone Encounter (Signed)
Patient called back stating that [employer] needs to be deleted from her account and rx needs to be submitted to [facility] by [facility] in [place]. RX sent as requested

## 2023-10-03 NOTE — Addendum Note (Signed)
Addended by: Maurice Small on: 10/03/2023 12:30 PM   Modules accepted: Orders

## 2023-10-09 ENCOUNTER — Other Ambulatory Visit: Payer: Self-pay

## 2023-10-09 DIAGNOSIS — E1165 Type 2 diabetes mellitus with hyperglycemia: Secondary | ICD-10-CM

## 2023-10-09 MED ORDER — OZEMPIC (0.25 OR 0.5 MG/DOSE) 2 MG/3ML ~~LOC~~ SOPN: 0.5000 mg | PEN_INJECTOR | SUBCUTANEOUS | 1 refills | Status: DC

## 2023-10-09 MED ORDER — LANTUS SOLOSTAR 100 UNIT/ML ~~LOC~~ SOPN: PEN_INJECTOR | SUBCUTANEOUS | 0 refills | Status: DC

## 2023-10-09 NOTE — Telephone Encounter (Signed)
Patient call in this afternoon to ask for Rx refill. Rx Refill was sent to the pharamcy

## 2023-10-10 ENCOUNTER — Ambulatory Visit: Payer: Medicare Other | Admitting: Family

## 2023-10-10 ENCOUNTER — Encounter: Payer: Self-pay | Admitting: Family

## 2023-10-10 VITALS — BP 128/80 | HR 70 | Temp 97.6°F | Resp 20 | Ht 68.0 in | Wt 278.6 lb

## 2023-10-10 DIAGNOSIS — E1165 Type 2 diabetes mellitus with hyperglycemia: Secondary | ICD-10-CM

## 2023-10-10 DIAGNOSIS — J069 Acute upper respiratory infection, unspecified: Secondary | ICD-10-CM

## 2023-10-10 LAB — POCT INFLUENZA A/B
Influenza A, POC: NEGATIVE
Influenza B, POC: NEGATIVE

## 2023-10-10 LAB — POC COVID19 BINAXNOW: SARS Coronavirus 2 Ag: NEGATIVE

## 2023-10-10 LAB — POCT RAPID STREP A (OFFICE): Rapid Strep A Screen: NEGATIVE

## 2023-10-10 NOTE — Progress Notes (Signed)
Provider: Richarda Blade FNP-C  Jossiah Smoak, Donalee Citrin, NP  Patient Care Team: Montgomery Favor, Donalee Citrin, NP as PCP - General (Family Medicine) Nahser, Deloris Ping, MD as PCP - Cardiology (Cardiology) Tat, Octaviano Batty, DO as Consulting Physician (Neurology) [facility]  Extended Emergency Contact Information Primary Emergency Contact: Moomaw,[name] Address: [address]          [place], [place] [address] D[place] of [place] Home Phone: [phone] Mobile Phone: [phone] Relation: Spouse Secondary Emergency Contact: Durning,John Mobile Phone: [phone] Relation: Other  Code Status:  Full Code  Goals of care: Advanced Directive information    10/10/2023    2:17 PM  Advanced Directives  Does Patient Have a Medical Advance Directive? No  Would patient like information on creating a medical advance directive? No - Patient declined     Chief Complaint  Patient presents with   Acute Visit    Bad cough and congestion.    Discussed the use of AI scribe software for clinical note transcription with the patient, who gave verbal consent to proceed.  History of Present Illness   The patient, with diabetes mellitus, presents with respiratory symptoms including cough and difficulty breathing.  Symptoms began on Sunday night with a runny nose, severe headache, and a persistent cough. She also experienced ear pain and difficulty breathing, which disturbed her sleep three times during the night. No fever or chills, but she has body aches similar to flu symptoms.  Her grandson, who had RSV, visited her the same day her symptoms began. Despite this exposure, her COVID test was negative.  She continues to have a persistent cough and runny nose. Her appetite is poor, having only consumed half a bowl of oatmeal today. No nausea, vomiting, or diarrhea. She feels very dry in her mouth and lips, indicating dehydration. She has no energy and feels very weak, especially  due to not eating.  She is diabetic and has not taken any medication other than Tylenol on Sunday and Monday night for her headache due to concerns related to her diabetes. She is cautious about insulin administration due to low intake and potential hypoglycemia.        Past Medical History:  Diagnosis Date   Allergy    Anxiety    Arthritis    Cataract    CHF (congestive heart failure) (HCC)    DM (diabetes mellitus) (HCC)    Essential tremor    head   G6PD deficiency    Glaucoma    Per new patient form   High cholesterol    Per new patient form   HTN (hypertension)    Hyperlipidemia    Sickle cell trait (HCC)    Past Surgical History:  Procedure Laterality Date   ABDOMINAL HYSTERECTOMY     Dr. Manson Passey / Per new patient form   ANKLE SURGERY Right    x 2   BREAST LUMPECTOMY Left    benign   COLONOSCOPY  2025   DG  BONE DENSITY ([facility] HX)     Per new patient form   DIAGNOSTIC MAMMOGRAM     Per new patient form   EYE SURGERY Left    age 46 - L eye removed, Dr. Merilynn Finland / Per new patient form   MINOR RELEASE DORSAL COMPARTMENT (DEQUERVAINS) Left 05/09/2022   Procedure: Left dequervains release and left dorsal forearm mass excision;  Surgeon: Gomez Cleverly, MD;  Location: [facility];  Service: Orthopedics;  Laterality: Left;  Regional block   REDUCTION MAMMAPLASTY Bilateral    1998    Allergies  Allergen Reactions   Bee Venom Anaphylaxis   Beeswax Anaphylaxis    bees   Fish Allergy Anaphylaxis   Shellfish Allergy Anaphylaxis    And all seafood   Egg-Derived Products Hives   Aspirin Other (See Comments)    G6P Deficiency   Nsaids Other (See Comments)    G6P Deficiency   Sulfa Antibiotics Other (See Comments)    G6P Deficiency     Outpatient Encounter Medications as of 10/10/2023  Medication Sig   acetaminophen (TYLENOL) 500 MG tablet Take 2 tablets by mouth every 6 (six) hours as needed.   atorvastatin (LIPITOR) 40 MG tablet Take 1 tablet (40  mg total) by mouth at bedtime.   B-D ULTRAFINE III SHORT PEN 31G X 8 MM MISC SMARTSIG:injection Twice Daily DX: E11.65   BETA CAROTENE PO Take 1 tablet by mouth daily with breakfast.   BIOTIN PO Take by mouth daily.   busPIRone (BUSPAR) 7.5 MG tablet Take 1 tablet (7.5 mg total) by mouth 3 (three) times daily as needed.   Continuous Glucose Sensor (FREESTYLE LIBRE 2 SENSOR) MISC 2 Devices by Does not apply route daily. Ell.65   docusate (COLACE) 50 MG/5ML liquid Take by mouth as needed for mild constipation. Patient takes one or twice a week as needed for constipation   EPINEPHrine 0.3 mg/0.3 mL IJ SOAJ injection Inject 0.3 mg into the muscle as needed for anaphylaxis.   hydrochlorothiazide (HYDRODIURIL) 25 MG tablet Take 1 tablet (25 mg total) by mouth daily with breakfast. (FILL AS  WRITTEN. PATIENT REQUIRES AND TOLERATES THIS MEDICATION AND WILL BE  APPROPRIATELY MONITORED)   insulin glargine (LANTUS SOLOSTAR) 100 UNIT/ML Solostar Pen INJECT 14 UNIT INTO THE SKIN IN THE MORNING   latanoprost (XALATAN) 0.005 % ophthalmic solution Place 1 drop into the right eye at bedtime.   losartan (COZAAR) 50 MG tablet Take 1 tablet (50 mg total) by mouth daily with breakfast.   Misc Natural Products (NEURIVA) CAPS Take 1 capsule by mouth daily with breakfast.   Multiple Vitamin (MULTIVITAMIN WITH MINERALS) TABS tablet Take 1 tablet by mouth daily with breakfast.   Multiple Vitamins-Minerals (HAIR/SKIN/NAILS/BIOTIN) TABS Take 1 tablet by mouth daily with breakfast.   Omega-3 Fatty Acids (FISH OIL) 1200 MG CAPS Take 1,200 mg by mouth daily with breakfast.   propranolol ER (INDERAL LA) 120 MG 24 hr capsule Take 1 capsule (120 mg total) by mouth at bedtime.   Semaglutide,0.25 or 0.5MG /DOS, (OZEMPIC, 0.25 OR 0.5 MG/DOSE,) 2 MG/3ML SOPN Inject 0.5 mg into the skin once a week.   SODIUM FLUORIDE, DENTAL RINSE, 0.2 % SOLN Place 1 application onto teeth daily as needed (to strenghten teeth).   timolol (TIMOPTIC) 0.5  % ophthalmic solution Place 1 drop into the right eye every morning.   traZODone (DESYREL) 50 MG tablet Take 1 tablet (50 mg total) by mouth at bedtime as needed for sleep. Takes maybe 3-4 times a month per pt   Zinc 50 MG TABS Take by mouth.   MAXITROL 3.5-10000-0.1 OINT Place into the right eye as needed. Apply to eye. (Patient not taking: Reported on 10/10/2023)   polyethylene glycol powder (GLYCOLAX/MIRALAX) 17 GM/SCOOP powder Take 17 g and stir and dissolve in 4 to 8 ounces of liquid and drink it right away by mouth daily. Hold for loose stool. (Patient not taking: Reported on 10/10/2023)   sodium phosphate (FLEET) ENEM Place 133 mLs (  1 enema total) rectally daily as needed for severe constipation. (Patient not taking: Reported on 10/10/2023)   No facility-administered encounter medications on file as of 10/10/2023.    Review of Systems  Constitutional:  Negative for appetite change, chills, fatigue, fever and unexpected weight change.  HENT:  Positive for congestion, ear pain and rhinorrhea. Negative for dental problem, ear discharge, facial swelling, hearing loss, nosebleeds, postnasal drip, sinus pressure, sinus pain, sneezing, sore throat and tinnitus.   Eyes:  Negative for pain, discharge, redness, itching and visual disturbance.  Respiratory:  Positive for cough. Negative for chest tightness, shortness of breath and wheezing.   Cardiovascular:  Negative for chest pain, palpitations and leg swelling.  Gastrointestinal:  Negative for abdominal distention, abdominal pain, blood in stool, constipation, diarrhea, nausea and vomiting.  Endocrine: Negative for cold intolerance, heat intolerance, polydipsia, polyphagia and polyuria.  Genitourinary:  Negative for difficulty urinating, dysuria, flank pain, frequency and urgency.  Musculoskeletal:  Negative for arthralgias, back pain, gait problem, joint swelling, myalgias, neck pain and neck stiffness.  Skin:  Negative for color change, pallor and  rash.  Neurological:  Positive for headaches. Negative for dizziness, weakness and light-headedness.    Immunization History  Administered Date(s) Administered   Fluad Trivalent(High Dose 65+) 05/22/2023   Influenza,trivalent, recombinat, inj, PF 08/07/2022   Moderna Covid-19 Vaccine Bivalent Booster 56yrs & up 05/22/2023   PFIZER(Purple Top)SARS-COV-2 Vaccination 09/14/2019, 10/22/2019, 06/26/2020, 01/19/2021, 06/21/2021, 06/14/2022   PNEUMOCOCCAL CONJUGATE-20 01/31/2022   Pfizer(Comirnaty)Fall Seasonal Vaccine 12 years and older 11/27/2022, 11/28/2022   Pneumococcal Polysaccharide-23 06/11/2020   Respiratory Syncytial Virus Vaccine,Recomb Aduvanted(Arexvy) 06/15/2022   Td 11/28/2022   Tdap 11/27/2022   Zoster Recombinant(Shingrix) 09/14/2017, 12/03/2017   Pertinent  Health Maintenance Due  Topic Date Due   HEMOGLOBIN A1C  08/10/2023   FOOT EXAM  02/29/2024   OPHTHALMOLOGY EXAM  06/04/2024   MAMMOGRAM  03/20/2025   Colonoscopy  09/23/2028   INFLUENZA VACCINE  Completed   DEXA SCAN  Completed      10/11/2022    1:37 PM 02/07/2023    8:45 AM 05/22/2023    2:50 PM 07/30/2023   11:07 AM 10/10/2023    2:17 PM  Fall Risk  Falls in the past year? 0 0 0 0 0  Was there an injury with Fall? 0 0  0 0  Fall Risk Category Calculator 0 0  0 0  Patient at Risk for Falls Due to No Fall Risks No Fall Risks  No Fall Risks   Fall risk Follow up Falls evaluation completed Falls evaluation completed      Functional Status Survey:    Vitals:   10/10/23 1423  BP: 128/80  Pulse: 70  Resp: 20  Temp: 97.6 F (36.4 C)  SpO2: 94%  Weight: 278 lb 9.6 oz (126.4 kg)  Height: 5\' 8"  (1.727 m)   Body mass index is 42.36 kg/m. Physical Exam  VITALS: T- 97.6, P- 70, BP- 128/80, SaO2- 94 HEENT: Ears without fluid or redness. Nose clear, no swelling or bleeding. Throat without redness. Oral mucosa dry. No tenderness on sinuses or forehead. Tenderness on right side of throat. CHEST: Lungs clear,  no wheezes. CARDIOVASCULAR: Heartbeat normal upon auscultation. ABDOMEN: Abdomen non-tender upon palpation. EXTREMITIES: No edema on legs.     Labs reviewed: Recent Labs    02/07/23 0936  NA 140  K 4.1  CL 104  CO2 28  GLUCOSE 159*  BUN 15  CREATININE 0.85  CALCIUM 9.5   Recent  Labs    02/07/23 0936  AST 21  ALT 19  BILITOT 0.7  PROT 6.8   Recent Labs    02/07/23 0936  WBC 6.7  NEUTROABS 3,357  HGB 13.4  HCT 41.0  MCV 96.7  PLT 199   Lab Results  Component Value Date   TSH 2.97 02/07/2023   Lab Results  Component Value Date   HGBA1C 6.3 (H) 02/07/2023   Lab Results  Component Value Date   CHOL 114 02/07/2023   HDL 42 (L) 02/07/2023   LDLCALC 49 02/07/2023   TRIG 152 (H) 02/07/2023   CHOLHDL 2.7 02/07/2023    Significant Diagnostic Results in last 30 days:  No results found.  Assessment/Plan  Upper Respiratory Tract Infection (URTI) Symptoms began Sunday night: rhinorrhea, headache, cough, dyspnea, otalgia, and myalgia. Negative for COVID-19, influenza, and RSV. Physical exam: clear lungs, no wheezes, no otitis, clear nasal passages, no pharyngeal erythema. Likely viral etiology. Discussed hydration, nutrition, and avoiding sedative antihistamines like Benadryl due to potential exacerbation of weakness. - Increase fluid intake - Consume warm soup and tea - Take Zyrtec once daily for rhinorrhea - Use Delsym for nocturnal cough - Monitor for chest pain, tightness, dyspnea, or wheezing and seek medical attention if these symptoms develop - POC Influenza A/B - POC Rapid Strep A - POC COVID-19  Diabetes Mellitus Emphasized blood glucose management, especially during illness. Recent issues with Freestyle Libre, Lantus, and Ozempic refills. Discussed importance of pre-insulin glucose checks to prevent hypoglycemia, particularly with reduced oral intake. Confirmed Ozempic and Lantus prescriptions sent yesterday. Advised using MyChart for timely  prescription requests. - Ensure blood glucose monitoring before insulin administration - Reduce sugar intake to support immunity - Confirm Ozempic and Lantus prescriptions were sent - Use MyChart for future prescription requests  Follow-up - Attend follow-up appointment on May 19th - Contact clinic if symptoms worsen or new symptoms develop before follow-up.     Family/ staff Communication: Reviewed plan of care with patient verbalized understanding   Labs/tests ordered:  - POC Influenza A/B - POC Rapid Strep A - POC COVID-19  Next Appointment: Return if symptoms worsen or fail to improve.   Caesar Bookman, NP

## 2023-10-22 ENCOUNTER — Other Ambulatory Visit: Payer: Self-pay | Admitting: Family

## 2023-10-22 DIAGNOSIS — F5101 Primary insomnia: Secondary | ICD-10-CM

## 2023-10-22 DIAGNOSIS — G250 Essential tremor: Secondary | ICD-10-CM

## 2023-11-19 ENCOUNTER — Other Ambulatory Visit: Payer: Self-pay | Admitting: Family

## 2023-11-19 DIAGNOSIS — L659 Nonscarring hair loss, unspecified: Secondary | ICD-10-CM

## 2023-11-19 NOTE — Progress Notes (Signed)
 Dermatologist referral ordered as requested  to Dr. Langston Reusing  [address] [place]  Or Dr Isaac Laud [address] [place]

## 2023-11-22 ENCOUNTER — Other Ambulatory Visit: Payer: Self-pay | Admitting: Family

## 2023-11-22 DIAGNOSIS — L659 Nonscarring hair loss, unspecified: Secondary | ICD-10-CM

## 2023-11-22 NOTE — Telephone Encounter (Signed)
Message routed to PCP Ngetich, Dinah C, NP  

## 2023-11-22 NOTE — Progress Notes (Signed)
 Dermatologist referral ordered to Dr Langston Reusing at [address] In [place] as requested by patient.

## 2023-11-23 ENCOUNTER — Other Ambulatory Visit: Payer: Self-pay | Admitting: Family

## 2023-11-23 DIAGNOSIS — E1165 Type 2 diabetes mellitus with hyperglycemia: Secondary | ICD-10-CM

## 2023-11-23 DIAGNOSIS — F411 Generalized anxiety disorder: Secondary | ICD-10-CM

## 2023-11-23 DIAGNOSIS — I10 Essential (primary) hypertension: Secondary | ICD-10-CM

## 2023-11-23 MED ORDER — OZEMPIC (1 MG/DOSE) 4 MG/3ML ~~LOC~~ SOPN: 1.0000 mg | PEN_INJECTOR | SUBCUTANEOUS | 3 refills | Status: DC

## 2023-11-23 NOTE — Progress Notes (Signed)
 Prescription for Ozempic 1 mg injection send to pharmacy.

## 2023-11-30 ENCOUNTER — Other Ambulatory Visit: Payer: Self-pay | Admitting: Family

## 2023-11-30 DIAGNOSIS — F411 Generalized anxiety disorder: Secondary | ICD-10-CM

## 2023-12-06 NOTE — Telephone Encounter (Signed)
 Carla please follow-up with patient in regards to dermatology referral status

## 2023-12-10 ENCOUNTER — Encounter: Payer: Self-pay | Admitting: Family

## 2023-12-10 ENCOUNTER — Ambulatory Visit (INDEPENDENT_AMBULATORY_CARE_PROVIDER_SITE_OTHER): Admitting: Family

## 2023-12-10 VITALS — BP 126/80 | HR 65 | Temp 97.8°F | Resp 20 | Ht 68.0 in | Wt 272.0 lb

## 2023-12-10 DIAGNOSIS — F411 Generalized anxiety disorder: Secondary | ICD-10-CM

## 2023-12-10 DIAGNOSIS — R600 Localized edema: Secondary | ICD-10-CM

## 2023-12-10 DIAGNOSIS — L989 Disorder of the skin and subcutaneous tissue, unspecified: Secondary | ICD-10-CM | POA: Diagnosis not present

## 2023-12-10 MED ORDER — FUROSEMIDE 20 MG PO TABS: 20.0000 mg | ORAL_TABLET | Freq: Every day | ORAL | 0 refills | Status: DC | PRN

## 2023-12-10 MED ORDER — POTASSIUM CHLORIDE CRYS ER 20 MEQ PO TBCR: 20.0000 meq | EXTENDED_RELEASE_TABLET | Freq: Every day | ORAL | 3 refills | Status: DC | PRN

## 2023-12-10 MED ORDER — BUSPIRONE HCL 7.5 MG PO TABS: 7.5000 mg | ORAL_TABLET | Freq: Two times a day (BID) | ORAL | 1 refills | Status: DC

## 2023-12-10 MED ORDER — POTASSIUM CHLORIDE CRYS ER 20 MEQ PO TBCR: 20.0000 meq | EXTENDED_RELEASE_TABLET | Freq: Every day | ORAL | Status: DC | PRN

## 2023-12-10 MED ORDER — FUROSEMIDE 20 MG PO TABS: 20.0000 mg | ORAL_TABLET | Freq: Every day | ORAL | 3 refills | Status: DC | PRN

## 2023-12-12 ENCOUNTER — Ambulatory Visit: Payer: Self-pay

## 2023-12-12 ENCOUNTER — Ambulatory Visit: Payer: Self-pay | Admitting: Family

## 2023-12-12 NOTE — Telephone Encounter (Signed)
 Medication list updated and mychart message sent to patient with providers response

## 2023-12-12 NOTE — Telephone Encounter (Signed)
**Note De-identified  [place] Obfuscation** Please advise 

## 2023-12-12 NOTE — Telephone Encounter (Signed)
 This encounter was created in error - please disregard.

## 2023-12-12 NOTE — Telephone Encounter (Signed)
 Chief Complaint: Left foot and ankle swelling Symptoms: see above Frequency: ongoing since last appt 03/31 Pertinent Negatives: Patient denies difficulty breathing, chest pain, redness in calf Disposition: [] ED /[] Urgent Care (no appt availability in office) / [] Appointment(In office/virtual)/ []  [facility]/ [] Home Care/ [] Refused Recommended Disposition /[] [facility]/ [x]  Follow-up with PCP Additional Notes: Patient called in stating she was advised to call in by Richarda Blade NP after her appt on 03/31 if the swelling in her feet did not fully resolve with use of 3-day Lasix regimen. Patient states her right foot has subsided but her left foot is still moderately swollen. Patient is looking for further information from PCP. Please call back at [phone].    Copied from CRM [id number]. Topic: Clinical - Red Word Triage >> Dec 12, 2023 11:52 AM Philippa Chester F wrote: [facility] that prompted transfer to Nurse Triage:  Swelling is occurring on the inner part left ankle and top of the left foot Reason for Disposition  [1] MILD swelling of both ankles (i.e., pedal edema) AND [2] is a chronic symptom (recurrent or ongoing AND present > 4 weeks)    See notes - no appt at this time because she was instructed to call back and check in with PCP  Answer Assessment - Initial Assessment Questions 1. LOCATION: "Which ankle is swollen?" "Where is the swelling?"     Left ankle and foot still swollen 2. ONSET: "When did the swelling start?"     See 03/31 notes 3. SWELLING: "How bad is the swelling?" Or, "How large is it?" (e.g., mild, moderate, severe; size of localized swelling)    - NONE: No joint swelling.   - LOCALIZED: Localized; small area of puffy or swollen skin (e.g., insect bite, skin irritation).   - MILD: Joint looks or feels mildly swollen or puffy.   - MODERATE: Swollen; interferes with normal activities (e.g., work or school); decreased range of movement; may be limping.    - SEVERE: Very swollen; can't move swollen joint at all; limping a lot or unable to walk.     Moderate 4. PAIN: "Is there any pain?" If Yes, ask: "How bad is it?" (Scale 1-10; or mild, moderate, severe)   - NONE (0): no pain.   - MILD (1-3): doesn't interfere with normal activities.    - MODERATE (4-7): interferes with normal activities (e.g., work or school) or awakens from sleep, limping.    - SEVERE (8-10): excruciating pain, unable to do any normal activities, unable to walk.      No 5. CAUSE: "What do you think caused the ankle swelling?"     See PCP notes 03/31 6. OTHER SYMPTOMS: "Do you have any other symptoms?" (e.g., fever, chest pain, difficulty breathing, calf pain)     No  Protocols used: Leg Swelling and Edema-A-AH, Ankle Swelling-A-AH

## 2023-12-12 NOTE — Telephone Encounter (Signed)
 Copied from CRM [id number]. Topic: Clinical - Medical Advice >> Dec 12, 2023 11:54 AM Philippa Chester F wrote: Reason for CRM: Patient is inquiring about whether or not to increase the times of day she is taking her furosemide (LASIX) 20 MG tablet medication due to still experiencing swelling on the inner part of her left ankle and top of left foot. Patient stated that since started the medication listed the patient has seen a significant decrease in the swelling on the right side but still experiencing the swelling on the left side.   Please advise.  Patient was warm transferred to nurse triage for swelling.  Callback Number: [phone] Answer Assessment - Initial Assessment Questions 1. NAME of MEDICINE: "What medicine(s) are you calling about?"     Lasix 20mg   2. QUESTION: "What is your question?" (e.g., double dose of medicine, side effect)     Started on for 3 days, wants to know if she should take another does  3. PRESCRIBER: "Who prescribed the medicine?" Reason: if prescribed by specialist, call should be referred to that group.     Dinah, NP  4. SYMPTOMS: "Do you have any symptoms?" If Yes, ask: "What symptoms are you having?"  "How bad are the symptoms (e.g., mild, moderate, severe)     Still having swelling om left ankle and left foot, but your right foot is better  5. PREGNANCY:  "Is there any chance that you are pregnant?" "When was your last menstrual period?"     no  Protocols used: Medication Question Call-A-AH

## 2023-12-12 NOTE — Telephone Encounter (Signed)
 See other phone note

## 2023-12-12 NOTE — Telephone Encounter (Signed)
 Yes.will recheck lab work during visit.

## 2023-12-12 NOTE — Telephone Encounter (Signed)
 Noted, sending to Ngetich, Donalee Citrin, NP as a [employer]

## 2023-12-12 NOTE — Addendum Note (Signed)
 Addended by: Maurice Small on: 12/12/2023 01:26 PM   Modules accepted: Orders

## 2023-12-12 NOTE — Telephone Encounter (Signed)
 Change Furosemide 20 mg tablet from as needed to one by mouth daily along with potassium chloride 20 meq tablet one by mouth daily for lower extremity edema. - wear knee high compression stockings on in the morning and off at bedtime.

## 2023-12-12 NOTE — Telephone Encounter (Signed)
 Patient called in because she was unclear about the return message she received today about her lasix. This RN explained to patient that per provider's updated notes, patient is to begin taking Furosemide 20 mg tablet once daily (instead of as needed), and to ensure she is taking Potassium Chloride 20 meq tablet daily as well. Patient states she will take this daily until her follow up appointment with her NP. Patient verbalizes concern if this will do damage to her body, and asks if labwork will be done at follow up appointment. Patient states all her needs have been met at this time.   Reason for Disposition  Caller has medicine question only, adult not sick, AND triager answers question  Answer Assessment - Initial Assessment Questions 1. NAME of MEDICINE: "What medicine(s) are you calling about?"     Lasix 2. QUESTION: "What is your question?" (e.g., double dose of medicine, side effect)     Patient asking about question that was noted by NP today  3. PRESCRIBER: "Who prescribed the medicine?" Reason: if prescribed by specialist, call should be referred to that group.     Dinah Ngetich NP 4. SYMPTOMS: "Do you have any symptoms?" If Yes, ask: "What symptoms are you having?"  "How bad are the symptoms (e.g., mild, moderate, severe)     Continued swelling in left foot and ankle  Protocols used: Medication Question Call-A-AH

## 2023-12-16 NOTE — Progress Notes (Signed)
 Provider: Richarda Blade FNP-C  Chaniah Cisse, Donalee Citrin, NP  Patient Care Team: Marri Mcneff, Donalee Citrin, NP as PCP - General (Family Medicine) Nahser, Deloris Ping, MD as PCP - Cardiology (Cardiology) Tat, Octaviano Batty, DO as Consulting Physician (Neurology) [facility]  Extended Emergency Contact Information Primary Emergency Contact: Kosar,[name] Address: [address]          [place], [place] [address] [place] of [place] Home Phone: [phone] Mobile Phone: [phone] Relation: Spouse Secondary Emergency Contact: Durning,John Mobile Phone: [phone] Relation: Other  Code Status:  Full Code  Goals of care: Advanced Directive information    12/10/2023    1:20 PM  Advanced Directives  Does Patient Have a Medical Advance Directive? No  Would patient like information on creating a medical advance directive? No - Patient declined     Chief Complaint  Patient presents with   Acute Visit    Swollen feet and mole between breast.    Discussed the use of AI scribe software for clinical note transcription with the patient, who gave verbal consent to proceed.  History of Present Illness   Wendy Obrien is a 73 year old female who presents with swollen feet and ankles.  She has swelling on the top of her feet and around her ankles, which was initially worse but improved slightly after elevating her feet for most of the previous day. This swelling occurred once before, two years ago, and was treated with Lasix, which she only took once. The Lasix is now expired, and she was considering taking hydrochlorothiazide (HCTZ) but wanted to consult first. No coughing or shortness of breath. Her weight has decreased by almost ten pounds since her last visit.  She has a history of bilateral ankle fractures and surgeries, which she speculates might contribute to her current symptoms. She acknowledges sitting for prolonged periods with her legs down and has been  using compression stockings, although they tend to fall down, indicating a need for new ones.  Her current medications include hydrochlorothiazide 25 mg, which she takes regularly. She also uses Miralax as needed for bowel issues, and she has not used Fleet enema in two to three years. She has stopped using Biotene and Maxitrol for the eye.  She mentions a new mole between her breasts, which she discovered while showering. It appeared suspicious, prompting her to take a picture. The mole is not itchy or painful, but its color has darkened over the past few days. She has a history of multiple moles and follows up with a dermatologist annually, although she has had difficulty securing an appointment recently.   Past Medical History:  Diagnosis Date   Allergy    Anxiety    Arthritis    Cataract    CHF (congestive heart failure) (HCC)    DM (diabetes mellitus) (HCC)    Essential tremor    head   G6PD deficiency    Glaucoma    Per new patient form   High cholesterol    Per new patient form   HTN (hypertension)    Hyperlipidemia    Sickle cell trait (HCC)    Past Surgical History:  Procedure Laterality Date   ABDOMINAL HYSTERECTOMY     Dr. Manson Passey / Per new patient form   ANKLE SURGERY Right    x 2   BREAST LUMPECTOMY Left    benign   COLONOSCOPY  2025   DG  BONE DENSITY ([facility] HX)     Per  new patient form   DIAGNOSTIC MAMMOGRAM     Per new patient form   EYE SURGERY Left    age 66 - L eye removed, Dr. Merilynn Finland / Per new patient form   MINOR RELEASE DORSAL COMPARTMENT (DEQUERVAINS) Left 05/09/2022   Procedure: Left dequervains release and left dorsal forearm mass excision;  Surgeon: Gomez Cleverly, MD;  Location: [facility];  Service: Orthopedics;  Laterality: Left;  Regional block   REDUCTION MAMMAPLASTY Bilateral    1998    Allergies  Allergen Reactions   Bee Venom Anaphylaxis   Beeswax Anaphylaxis    bees   Fish Allergy Anaphylaxis   Shellfish Allergy  Anaphylaxis    And all seafood   Egg-Derived Products Hives   Aspirin Other (See Comments)    G6P Deficiency   Nsaids Other (See Comments)    G6P Deficiency   Sulfa Antibiotics Other (See Comments)    G6P Deficiency     Outpatient Encounter Medications as of 12/10/2023  Medication Sig   acetaminophen (TYLENOL) 500 MG tablet Take 2 tablets by mouth every 6 (six) hours as needed.   atorvastatin (LIPITOR) 40 MG tablet TAKE ONE TABLET BY MOUTH EVERY DAY AT BEDTIME   B-D ULTRAFINE III SHORT PEN 31G X 8 MM MISC SMARTSIG:injection Twice Daily DX: E11.65   BETA CAROTENE PO Take 1 tablet by mouth daily with breakfast.   BIOTIN PO Take by mouth daily.   Continuous Glucose Sensor (FREESTYLE LIBRE 2 SENSOR) MISC 2 Devices by Does not apply route daily. Ell.65   docusate (COLACE) 50 MG/5ML liquid Take by mouth as needed for mild constipation. Patient takes one or twice a week as needed for constipation   EPINEPHrine 0.3 mg/0.3 mL IJ SOAJ injection Inject 0.3 mg into the muscle as needed for anaphylaxis.   hydrochlorothiazide (HYDRODIURIL) 25 MG tablet Take 1 tablet (25 mg total) by mouth daily with breakfast. (FILL AS  WRITTEN. PATIENT REQUIRES AND TOLERATES THIS MEDICATION AND WILL BE  APPROPRIATELY MONITORED)   insulin glargine (LANTUS SOLOSTAR) 100 UNIT/ML Solostar Pen INJECT 14 UNITS SUBCUTANEOUSLY EVERY DAY IN THE MORNING (DISCARD 28 DAYS AFTER FIRST USE)   latanoprost (XALATAN) 0.005 % ophthalmic solution INSTILL 1 DROP IN RIGHT EYE EVERY DAY AT BEDTIME - (REFRIGERATE UNTIL BOTTLE IS OPENED)   losartan (COZAAR) 50 MG tablet TAKE ONE TABLET BY MOUTH EVERY DAY WITH BREAKFAST   Misc Natural Products (NEURIVA) CAPS Take 1 capsule by mouth daily with breakfast.   Multiple Vitamin (MULTIVITAMIN WITH MINERALS) TABS tablet Take 1 tablet by mouth daily with breakfast.   Multiple Vitamins-Minerals (HAIR/SKIN/NAILS/BIOTIN) TABS Take 1 tablet by mouth daily with breakfast.   Omega-3 Fatty Acids (FISH OIL)  1200 MG CAPS Take 1,200 mg by mouth daily with breakfast.   polyethylene glycol powder (GLYCOLAX/MIRALAX) 17 GM/SCOOP powder Take 17 g and stir and dissolve in 4 to 8 ounces of liquid and drink it right away by mouth daily. Hold for loose stool.   propranolol ER (INDERAL LA) 120 MG 24 hr capsule TAKE ONE CAPSULE BY MOUTH EVERY DAY AT BEDTIME   Semaglutide, 1 MG/DOSE, (OZEMPIC, 1 MG/DOSE,) 4 MG/3ML SOPN Inject 1 mg into the skin once a week.   SODIUM FLUORIDE, DENTAL RINSE, 0.2 % SOLN Place 1 application onto teeth daily as needed (to strenghten teeth).   sodium phosphate (FLEET) ENEM Place 133 mLs (1 enema total) rectally daily as needed for severe constipation.   timolol (TIMOPTIC) 0.5 % ophthalmic solution Place 1  drop into the right eye every morning.   traZODone (DESYREL) 50 MG tablet TAKE ONE TABLET BY MOUTH EVERY DAY AT BEDTIME AS NEEDED FOR SLEEP.   Zinc 50 MG TABS Take by mouth.   [DISCONTINUED] busPIRone (BUSPAR) 7.5 MG tablet TAKE ONE TABLET BY MOUTH THREE TIMES A DAY AS NEEDED   [DISCONTINUED] furosemide (LASIX) 20 MG tablet Take 1 tablet (20 mg total) by mouth daily as needed.   [DISCONTINUED] potassium chloride SA (KLOR-CON M) 20 MEQ tablet Take 1 tablet (20 mEq total) by mouth daily as needed. Along with Furosemide   busPIRone (BUSPAR) 7.5 MG tablet Take 1 tablet (7.5 mg total) by mouth 2 (two) times daily.   [DISCONTINUED] furosemide (LASIX) 20 MG tablet Take 1 tablet (20 mg total) by mouth daily as needed.   [DISCONTINUED] MAXITROL 3.5-10000-0.1 OINT Place into the right eye as needed. Apply to eye. (Patient not taking: Reported on 12/10/2023)   [DISCONTINUED] potassium chloride SA (KLOR-CON M) 20 MEQ tablet Take 1 tablet (20 mEq total) by mouth daily as needed. Along with Furosemide   No facility-administered encounter medications on file as of 12/10/2023.    Review of Systems  Constitutional:  Negative for appetite change, chills, fatigue, fever and unexpected weight change.   HENT:  Negative for congestion, dental problem, ear discharge, ear pain, facial swelling, hearing loss, nosebleeds, postnasal drip, rhinorrhea, sinus pressure, sinus pain, sneezing, sore throat, tinnitus and trouble swallowing.   Eyes:  Negative for pain, discharge, redness, itching and visual disturbance.  Respiratory:  Negative for cough, chest tightness, shortness of breath and wheezing.   Cardiovascular:  Positive for leg swelling. Negative for chest pain and palpitations.  Gastrointestinal:  Negative for abdominal distention, abdominal pain, blood in stool, constipation, diarrhea, nausea and vomiting.  Endocrine: Negative for cold intolerance, heat intolerance, polydipsia, polyphagia and polyuria.  Genitourinary:  Negative for difficulty urinating, dysuria, flank pain, frequency and urgency.  Musculoskeletal:  Negative for arthralgias, back pain, gait problem, joint swelling, myalgias, neck pain and neck stiffness.  Skin:  Negative for color change, pallor, rash and wound.       Change in color of mole on chest  Neurological:  Negative for dizziness, syncope, speech difficulty, weakness, light-headedness, numbness and headaches.  Hematological:  Does not bruise/bleed easily.  Psychiatric/Behavioral:  Negative for agitation, behavioral problems, confusion, hallucinations, self-injury, sleep disturbance and suicidal ideas. The patient is not nervous/anxious.     Immunization History  Administered Date(s) Administered   Fluad Trivalent(High Dose 65+) 05/22/2023   Influenza,trivalent, recombinat, inj, PF 08/07/2022   Moderna Covid-19 Vaccine Bivalent Booster 55yrs & up 05/22/2023   PFIZER(Purple Top)SARS-COV-2 Vaccination 09/14/2019, 10/22/2019, 06/26/2020, 01/19/2021, 06/21/2021, 06/14/2022   PNEUMOCOCCAL CONJUGATE-20 01/31/2022   Pfizer(Comirnaty)Fall Seasonal Vaccine 12 years and older 11/27/2022, 11/28/2022   Pneumococcal Polysaccharide-23 06/11/2020   Respiratory Syncytial Virus  Vaccine,Recomb Aduvanted(Arexvy) 06/15/2022   Td 11/28/2022   Tdap 11/27/2022   Zoster Recombinant(Shingrix) 09/14/2017, 12/03/2017   Pertinent  Health Maintenance Due  Topic Date Due   HEMOGLOBIN A1C  08/10/2023   FOOT EXAM  02/29/2024   INFLUENZA VACCINE  04/11/2024   OPHTHALMOLOGY EXAM  06/04/2024   MAMMOGRAM  03/20/2025   Colonoscopy  09/23/2028   DEXA SCAN  Completed      02/07/2023    8:45 AM 05/22/2023    2:50 PM 07/30/2023   11:07 AM 10/10/2023    2:17 PM 12/10/2023    1:19 PM  Fall Risk  Falls in the past year? 0 0 0  0 0  Was there an injury with Fall? 0  0 0 0  Fall Risk Category Calculator 0  0 0 0  Patient at Risk for Falls Due to No Fall Risks  No Fall Risks  No Fall Risks  Fall risk Follow up Falls evaluation completed    Falls evaluation completed   Functional Status Survey:    Vitals:   12/10/23 1323  BP: 126/80  Pulse: 65  Resp: 20  Temp: 97.8 F (36.6 C)  SpO2: 95%  Weight: 272 lb (123.4 kg)  Height: 5\' 8"  (1.727 m)   Body mass index is 41.36 kg/m. Physical Exam  VITALS: T- 97.8, P- 65, BP- 126/80, SaO2- 95% MEASUREMENTS: Weight- 272. GENERAL: Alert, cooperative, well developed, no acute distress. HEENT: Normocephalic, normal oropharynx, moist mucous membranes. CHEST: Clear to auscultation bilaterally, no wheezes, rhonchi, or crackles. CARDIOVASCULAR: Normal heart rate and rhythm, S1 and S2 normal without murmurs. ABDOMEN: Soft, non-tender, non-distended, without organomegaly, normal bowel sounds. EXTREMITIES: Tenderness on the outside of the ankle, no cyanosis or edema. NEUROLOGICAL: Cranial nerves grossly intact, moves all extremities without gross motor or sensory deficit. SKIN: Suspicious mole between breasts, dark with red base.   Labs reviewed: Recent Labs    02/07/23 0936  NA 140  K 4.1  CL 104  CO2 28  GLUCOSE 159*  BUN 15  CREATININE 0.85  CALCIUM 9.5   Recent Labs    02/07/23 0936  AST 21  ALT 19  BILITOT 0.7  PROT  6.8   Recent Labs    02/07/23 0936  WBC 6.7  NEUTROABS 3,357  HGB 13.4  HCT 41.0  MCV 96.7  PLT 199   Lab Results  Component Value Date   TSH 2.97 02/07/2023   Lab Results  Component Value Date   HGBA1C 6.3 (H) 02/07/2023   Lab Results  Component Value Date   CHOL 114 02/07/2023   HDL 42 (L) 02/07/2023   LDLCALC 49 02/07/2023   TRIG 152 (H) 02/07/2023   CHOLHDL 2.7 02/07/2023    Significant Diagnostic Results in last 30 days:  No results found.  Assessment/Plan  Peripheral Edema Bilateral swelling of the feet and ankles, previously experienced two years ago. No associated cough or shortness of breath. Weight has decreased by almost ten pounds since last visit. Blood pressure is well-controlled at 126/80 mmHg. Heart rate is 65 bpm, temperature is 97.48F, and oxygen saturation is 95%. Bilateral ankle surgeries in the past. Compression stockings were ineffective due to improper fit. She has been advised to elevate her legs and monitor salt intake. Furosemide (Lasix) with potassium supplementation is planned to manage the edema. She prefers to obtain potassium over the counter due to cost concerns. Monitoring for symptoms of fluid overload, such as shortness of breath, wheezing, or cough, is emphasized. The decision to prescribe furosemide is based on previous effective use and current symptoms, with a plan to reassess in three to four weeks. - Prescribe furosemide 30 tablets to local [facility]. - Advise taking furosemide once daily for three days, then as needed. - Advise taking potassium with furosemide. - Advise obtaining potassium over the counter. - Advise wearing properly fitting compression stockings. - Advise elevating legs when sitting. - Advise monitoring salt intake. - Instruct to monitor for symptoms of fluid overload.  Suspicious Mole Newly identified mole between the breasts, noticed during a shower. The mole is not itchy or painful but appears suspicious  with changes in color and shape. She is  anxious about the mole and has difficulty obtaining a timely dermatology appointment. The mole is likely slow-growing and not immediately concerning but should be evaluated by a dermatologist for potential removal and biopsy. The decision to refer is based on the mole's appearance and her anxiety. - Refer to a dermatologist for evaluation and possible removal of the mole.  Generalized Anxiety Disorder  - continue on Buspirone 7.5 mg tablet daily   Medication Management Currently managing multiple medications, including hydrochlorothiazide, Miralax, and buspirone. She expressed concerns about the cost of medications and prefers to purchase potassium over the counter. A prescription issue with buspirone, which was prescribed as needed, requires clarification for scheduled dosing. The prescription will be adjusted to twice daily to meet pharmacy requirements. The decision to adjust the buspirone prescription is based on pharmacy feedback and her current usage pattern. - Adjust buspirone prescription to twice daily dosing. - Send buspirone prescription to mailing pharmacy.   Family/ staff Communication: Reviewed plan of care with patient verbalized understanding   Labs/tests ordered: None   Next Appointment: Return if symptoms worsen or fail to improve.   Total time: 26 minutes.Greater than 50% of total time spent doing patient education regarding peripheral edema,Mole,GAD, health maintenance including symptom/medication management.   Caesar Bookman, NP

## 2023-12-17 ENCOUNTER — Other Ambulatory Visit: Payer: Self-pay | Admitting: Family

## 2023-12-17 DIAGNOSIS — E1165 Type 2 diabetes mellitus with hyperglycemia: Secondary | ICD-10-CM

## 2023-12-18 NOTE — Telephone Encounter (Signed)
 Wendy Obrien patients las covid vaccine was September 2024. Please advise if additional vaccines are recommended at this time

## 2023-12-18 NOTE — Telephone Encounter (Signed)
 COVID-19 vaccine up to date no other vaccine required for now.

## 2023-12-19 NOTE — Telephone Encounter (Signed)
 Providers message set to patient via mychart

## 2023-12-20 ENCOUNTER — Other Ambulatory Visit: Payer: Self-pay | Admitting: Family

## 2023-12-20 DIAGNOSIS — E1165 Type 2 diabetes mellitus with hyperglycemia: Secondary | ICD-10-CM

## 2024-01-04 HISTORY — PX: CATARACT EXTRACTION: SUR2

## 2024-01-14 ENCOUNTER — Ambulatory Visit: Payer: Self-pay

## 2024-01-14 ENCOUNTER — Ambulatory Visit (INDEPENDENT_AMBULATORY_CARE_PROVIDER_SITE_OTHER): Admitting: Nurse Practitioner

## 2024-01-14 ENCOUNTER — Encounter: Payer: Self-pay | Admitting: Nurse Practitioner

## 2024-01-14 VITALS — BP 128/80 | HR 61 | Temp 97.8°F | Resp 20 | Ht 68.0 in | Wt 261.0 lb

## 2024-01-14 DIAGNOSIS — M5442 Lumbago with sciatica, left side: Secondary | ICD-10-CM | POA: Diagnosis not present

## 2024-01-14 DIAGNOSIS — E1165 Type 2 diabetes mellitus with hyperglycemia: Secondary | ICD-10-CM

## 2024-01-14 DIAGNOSIS — R350 Frequency of micturition: Secondary | ICD-10-CM

## 2024-01-14 DIAGNOSIS — M5441 Lumbago with sciatica, right side: Secondary | ICD-10-CM

## 2024-01-14 LAB — POCT URINALYSIS DIPSTICK
Blood, UA: NEGATIVE
Glucose, UA: NEGATIVE
Ketones, UA: NEGATIVE
Leukocytes, UA: NEGATIVE
Nitrite, UA: NEGATIVE
Protein, UA: NEGATIVE
Spec Grav, UA: 1.01 (ref 1.010–1.025)
Urobilinogen, UA: 0.2 U/dL
pH, UA: 5 (ref 5.0–8.0)

## 2024-01-14 MED ORDER — PREDNISONE 10 MG (21) PO TBPK: ORAL_TABLET | ORAL | 0 refills | Status: DC

## 2024-01-14 NOTE — Telephone Encounter (Signed)
 Appointment scheduled for evaluation 01/14/2024.

## 2024-01-14 NOTE — Telephone Encounter (Signed)
 Copied from CRM [id number]. Topic: Clinical - Red Word Triage >> Jan 14, 2024  9:04 AM Carrielelia G wrote: [facility] that prompted transfer to Nurse Triage: dark urine, foul odor, back pain is about a 9 on the scale (3 days)  Chief Complaint: dark urine, foul odor, back pain Symptoms: see above Frequency: constant x3 days Pertinent Negatives: Patient denies fever, flank pain Disposition: [] ED /[] Urgent Care (no appt availability in office) / [x] Appointment(In office/virtual)/ []  [facility]/ [] Home Care/ [] Refused Recommended Disposition /[] [facility]/ []  Follow-up with PCP Additional Notes: per protocol apt made for today; care advice given, denies questions; instructed to go to ER if becomes worse.   Reason for Disposition  Side (flank) or lower back pain present  Answer Assessment - Initial Assessment Questions 1. SEVERITY: "How bad is the pain?"  (e.g., Scale 1-10; mild, moderate, or severe)   - MILD (1-3): complains slightly about urination hurting   - MODERATE (4-7): interferes with normal activities     - SEVERE (8-10): excruciating, unwilling or unable to urinate because of the pain      9/10 2. FREQUENCY: "How many times have you had painful urination today?"      Back pain 3. PATTERN: "Is pain present every time you urinate or just sometimes?"      denies 4. ONSET: "When did the painful urination start?"      3 days ago 5. FEVER: "Do you have a fever?" If Yes, ask: "What is your temperature, how was it measured, and when did it start?"     no 6. PAST UTI: "Have you had a urine infection before?" If Yes, ask: "When was the last time?" and "What happened that time?"      yes 7. CAUSE: "What do you think is causing the painful urination?"  (e.g., UTI, scratch, Herpes sore)     uti 8. OTHER SYMPTOMS: "Do you have any other symptoms?" (e.g., blood in urine, flank pain, genital sores, urgency, vaginal discharge)     Back pain 9. PREGNANCY: "Is there any  chance you are pregnant?" "When was your last menstrual period?"     na  Protocols used: Urination Pain - Female-A-AH

## 2024-01-14 NOTE — Patient Instructions (Signed)
 Tylenol  500 mg tablet 2 tablets every 8 hours as needed for pain Prednisone taper Continue heating pad three times daily  Can use muscle rub after heat

## 2024-01-14 NOTE — Progress Notes (Signed)
 Careteam: Patient Care Team: Ngetich, Elijio Guadeloupe, NP as PCP - General (Family Medicine) Nahser, Lela Purple, MD as PCP - Cardiology (Cardiology) Tat, Von Grumbling, DO as Consulting Physician (Neurology) [facility]  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    HPI:  Discussed the use of AI scribe software for clinical note transcription with the patient, who gave verbal consent to proceed.  History of Present Illness Wendy Obrien is a 73 year old female who presents with severe lower back pain and concerns of a possible urinary tract infection.  She has been experiencing severe lower back pain for the past three days, which has progressively worsened. The pain radiates down both buttocks but does not extend down the thighs. She rates the pain as a nine out of ten at its worst. Motrin  has been taken, reducing the pain to a six, but the relief is temporary. A heating pad is used for additional relief. No history of similar back pain is noted, and no recent injuries are reported, although she recalls bending down to pick up her grandson, which may have triggered the pain. The pain is described as unbearable, especially when sitting down or getting up. No spasms, loss of bowel or bladder control, or loss of footing are reported.  She is concerned about a  possible urinary tract infection due to frequent urination, odor, and color changes in her urine. No issues with voiding or vaginal symptoms are present. She has a history of UTIs.  Her fasting blood sugars have been elevated over the past three days, ranging from 120 to 135, compared to her usual 90 to 111. She uses a Chief Operating Officer for monitoring her blood sugar levels.  She mentions that she is very active, and she struggles to rest as needed. She dislikes water but tries to stay hydrated, drinking from a large tumbler multiple times a day.   Review of Systems:  Review of Systems  Constitutional:  Negative for chills, fever and weight loss.  HENT:  Negative for tinnitus.   Respiratory:  Negative for cough, sputum production and shortness of breath.   Cardiovascular:  Negative for chest pain, palpitations and leg swelling.  Gastrointestinal:  Negative for abdominal pain, constipation, diarrhea and heartburn.  Genitourinary:  Positive for frequency. Negative for dysuria and urgency.  Musculoskeletal:  Positive for back pain. Negative for falls, joint pain and myalgias.  Skin: Negative.   Neurological:  Negative for dizziness and headaches.  Psychiatric/Behavioral:  Negative for depression and memory loss. The patient does not have insomnia.     Past Medical History:  Diagnosis Date   Allergy    Anxiety    Arthritis    Cataract    CHF (congestive heart failure) (HCC)    DM (diabetes mellitus) (  HCC)    Essential tremor    head   G6PD deficiency    Glaucoma    Per new patient form   High cholesterol    Per new patient form   HTN (hypertension)    Hyperlipidemia    Sickle cell trait (HCC)    Past Surgical History:  Procedure Laterality Date   ABDOMINAL HYSTERECTOMY     Dr. Bevin Bucks / Per new patient form   ANKLE SURGERY Right    x 2   BREAST LUMPECTOMY Left    benign   CATARACT EXTRACTION Right 01/04/2024   [facility]   COLONOSCOPY  2025   DG  BONE  DENSITY ([facility] HX)     Per new patient form   DIAGNOSTIC MAMMOGRAM     Per new patient form   EYE SURGERY Left    age 45 - L eye removed, Dr. Diedre Fox / Per new patient form   MINOR RELEASE DORSAL COMPARTMENT (DEQUERVAINS) Left 05/09/2022   Procedure: Left dequervains release and left dorsal forearm mass excision;  Surgeon: Ltanya Rummer, MD;  Location: [facility];  Service: Orthopedics;  Laterality: Left;  Regional block   REDUCTION MAMMAPLASTY Bilateral    1998   Social History:   reports that she quit smoking about 27 years ago. Her smoking use included cigarettes. She started smoking about 57 years ago. She has a 6 pack-year smoking history. She has never used smokeless tobacco. She reports that she does not currently use alcohol. She reports that she does not use drugs.  Family History  Problem Relation Age of Onset   Lung cancer Mother    Hypertension Mother    Diabetes Mother    Hypertension Father    Colon cancer Father    Sudden Cardiac Death Father    Colon polyps Sister    Colon polyps Sister    HIV Brother    Colon polyps Brother    HIV Brother    Stomach cancer Maternal Aunt    Colon cancer Paternal Aunt    Stomach cancer Paternal Uncle    Colon cancer Paternal Uncle    Arthritis Maternal Grandmother    Heart attack Maternal Grandfather    Colon cancer Other    Esophageal cancer Neg Hx    Rectal cancer Neg Hx     Medications: Patient's Medications  New Prescriptions   No medications on file  Previous Medications   ACETAMINOPHEN  (TYLENOL ) 500 MG TABLET    Take 2 tablets by mouth every 6 (six) hours as needed.   ATORVASTATIN  (LIPITOR) 40 MG TABLET    TAKE ONE TABLET BY MOUTH EVERY DAY AT BEDTIME   B-D ULTRAFINE III SHORT PEN 31G X 8 MM MISC    SMARTSIG:injection Twice Daily DX: E11.65   BETA CAROTENE PO    Take 1 tablet by mouth daily with breakfast.   BIOTIN PO    Take by mouth daily.   BUSPIRONE  (BUSPAR ) 7.5 MG TABLET    Take 1 tablet (7.5  mg total) by mouth 2 (two) times daily.   CONTINUOUS GLUCOSE SENSOR (FREESTYLE LIBRE 2 SENSOR) MISC    2 Devices by Does not apply route daily. Ell.65   DOCUSATE (COLACE) 50 MG/5ML LIQUID    Take by mouth as needed for mild constipation. Patient takes one or twice a week as needed for constipation   EPINEPHRINE  0.3 MG/0.3 ML IJ SOAJ INJECTION    Inject 0.3 mg into the muscle as needed  for anaphylaxis.   FUROSEMIDE  (LASIX ) 20 MG TABLET    Take 1 tablet (20 mg total) by mouth daily.   HYDROCHLOROTHIAZIDE  (HYDRODIURIL ) 25 MG TABLET    Take 1 tablet (25 mg total) by mouth daily with breakfast. (FILL AS  WRITTEN. PATIENT REQUIRES AND TOLERATES THIS MEDICATION AND WILL BE  APPROPRIATELY MONITORED)   INSULIN  GLARGINE (LANTUS  SOLOSTAR) 100 UNIT/ML SOLOSTAR PEN    INJECT 14 UNITS SUBCUTANEOUSLY EVERY DAY IN THE MORNING (DISCARD 28 DAYS AFTER FIRST USE)   LATANOPROST  (XALATAN ) 0.005 % OPHTHALMIC SOLUTION    INSTILL 1 DROP IN RIGHT EYE EVERY DAY AT BEDTIME - (REFRIGERATE UNTIL BOTTLE IS OPENED)   LOSARTAN  (COZAAR ) 50 MG TABLET    TAKE ONE TABLET BY MOUTH EVERY DAY WITH BREAKFAST   MISC NATURAL PRODUCTS (NEURIVA) CAPS    Take 1 capsule by mouth daily with breakfast.   MULTIPLE VITAMIN (MULTIVITAMIN WITH MINERALS) TABS TABLET    Take 1 tablet by mouth daily with breakfast.   MULTIPLE VITAMINS-MINERALS (HAIR/SKIN/NAILS/BIOTIN) TABS    Take 1 tablet by mouth daily with breakfast.   OMEGA-3 FATTY ACIDS (FISH OIL) 1200 MG CAPS    Take 1,200 mg by mouth daily with breakfast.   OZEMPIC , 1 MG/DOSE, 4 MG/3ML SOPN    INJECT 1MG  SUBCUTANEOUSLY ONCE A WEEK   POLYETHYLENE GLYCOL POWDER (GLYCOLAX /MIRALAX ) 17 GM/SCOOP POWDER    Take 17 g and stir and dissolve in 4 to 8 ounces of liquid and drink it right away by mouth daily. Hold for loose stool.   POTASSIUM CHLORIDE  SA (KLOR-CON  M) 20 MEQ TABLET    Take 1 tablet (20 mEq total) by mouth daily. Along with Furosemide    PROPRANOLOL  ER (INDERAL  LA) 120 MG 24 HR CAPSULE    TAKE ONE  CAPSULE BY MOUTH EVERY DAY AT BEDTIME   SODIUM FLUORIDE, DENTAL RINSE, 0.2 % SOLN    Place 1 application onto teeth daily as needed (to strenghten teeth).   SODIUM PHOSPHATE  (FLEET) ENEM    Place 133 mLs (1 enema total) rectally daily as needed for severe constipation.   TIMOLOL  (TIMOPTIC ) 0.5 % OPHTHALMIC SOLUTION    Place 1 drop into the right eye every morning.   TRAZODONE  (DESYREL ) 50 MG TABLET    TAKE ONE TABLET BY MOUTH EVERY DAY AT BEDTIME AS NEEDED FOR SLEEP.   ZINC  50 MG TABS    Take by mouth.  Modified Medications   No medications on file  Discontinued Medications   No medications on file    Physical Exam:  Vitals:   01/14/24 1138  BP: 128/80  Pulse: 61  Resp: 20  Temp: 97.8 F (36.6 C)  SpO2: 95%  Weight: 261 lb (118.4 kg)  Height: 5\' 8"  (1.727 m)   Body mass index is 39.68 kg/m. Wt Readings from Last 3 Encounters:  01/14/24 261 lb (118.4 kg)  12/10/23 272 lb (123.4 kg)  10/10/23 278 lb 9.6 oz (126.4 kg)    Physical Exam Constitutional:      General: She is not in acute distress.    Appearance: She is well-developed. She is not diaphoretic.  HENT:     Head: Normocephalic and atraumatic.     Mouth/Throat:     Pharynx: No oropharyngeal exudate.  Eyes:     Conjunctiva/sclera: Conjunctivae normal.     Pupils: Pupils are equal, round, and reactive to light.  Cardiovascular:     Rate and Rhythm: Normal rate and regular rhythm.     Heart sounds: Normal  heart sounds.  Pulmonary:     Effort: Pulmonary effort is normal.     Breath sounds: Normal breath sounds.  Abdominal:     General: Bowel sounds are normal.     Palpations: Abdomen is soft.  Musculoskeletal:     Cervical back: Normal range of motion and neck supple.     Lumbar back: Tenderness present. Positive right straight leg raise test.       Back:     Right lower leg: No edema.     Left lower leg: No edema.  Skin:    General: Skin is warm and dry.  Neurological:     Mental Status: She is alert.      Motor: No weakness.     Gait: Gait is intact.     Deep Tendon Reflexes: Reflexes are normal and symmetric.  Psychiatric:        Mood and Affect: Mood normal.     Labs reviewed: Basic Metabolic Panel: Recent Labs    02/07/23 0936  NA 140  K 4.1  CL 104  CO2 28  GLUCOSE 159*  BUN 15  CREATININE 0.85  CALCIUM  9.5  TSH 2.97   Liver Function Tests: Recent Labs    02/07/23 0936  AST 21  ALT 19  BILITOT 0.7  PROT 6.8   No results for input(s): "LIPASE", "AMYLASE" in the last 8760 hours. No results for input(s): "AMMONIA" in the last 8760 hours. CBC: Recent Labs    02/07/23 0936  WBC 6.7  NEUTROABS 3,357  HGB 13.4  HCT 41.0  MCV 96.7  PLT 199   Lipid Panel: Recent Labs    02/07/23 0936  CHOL 114  HDL 42*  LDLCALC 49  TRIG 409*  CHOLHDL 2.7   TSH: Recent Labs    02/07/23 0936  TSH 2.97   A1C: Lab Results  Component Value Date   HGBA1C 6.3 (H) 02/07/2023     Assessment/Plan Assessment and Plan Assessment & Plan Low back pain Acute musculoskeletal low back pain, likely exacerbated by lifting. Pain reduced with ibuprofen . Discussed prednisone for inflammation. Advised on potential need for physical therapy if symptoms persist. Instructed to seek immediate care for bowel or bladder dysfunction or loss of footing. - Prescribe prednisone taper for pain and inflammation. - Advise against ibuprofen ; recommend acetaminophen  1000 mg every 8 hours as needed for pain. - Continue using a heating pad three times daily. - Consider muscle rub such as Bengay, Aspercreme, or Biofreeze after heat application if muscles feel tight. - Advise against heavy lifting and activities that exacerbate pain. - If pain persists, consider referral to physical therapy. - Send prescription to [facility] on [address] and [address].  Diabetes Previously well controlled. Discussed prednisone's impact on glucose levels. Emphasized strict diet to prevent elevation.  Prednisone may increase hunger, requiring dietary management. - Prescribe prednisone taper for low back pain, with caution to monitor blood glucose levels closely. - Advise maintaining a strict diet, avoiding extra sugars and carbohydrates, to prevent blood glucose spikes while on prednisone.  Urine frequency -     POCT urinalysis dipstick- negative for UTI Encouraged proper hydration    Yides Saidi K. Denney Fisherman [facility] [phone]

## 2024-01-28 ENCOUNTER — Encounter: Payer: Self-pay | Admitting: Family

## 2024-01-28 ENCOUNTER — Ambulatory Visit (INDEPENDENT_AMBULATORY_CARE_PROVIDER_SITE_OTHER): Payer: Medicare Other | Admitting: Family

## 2024-01-28 VITALS — BP 126/80 | HR 60 | Temp 98.0°F | Resp 20 | Ht 68.0 in | Wt 273.8 lb

## 2024-01-28 DIAGNOSIS — I5032 Chronic diastolic (congestive) heart failure: Secondary | ICD-10-CM | POA: Diagnosis not present

## 2024-01-28 DIAGNOSIS — K5901 Slow transit constipation: Secondary | ICD-10-CM

## 2024-01-28 DIAGNOSIS — E1165 Type 2 diabetes mellitus with hyperglycemia: Secondary | ICD-10-CM | POA: Diagnosis not present

## 2024-01-28 DIAGNOSIS — M5416 Radiculopathy, lumbar region: Secondary | ICD-10-CM

## 2024-01-28 DIAGNOSIS — E559 Vitamin D deficiency, unspecified: Secondary | ICD-10-CM

## 2024-01-28 DIAGNOSIS — F411 Generalized anxiety disorder: Secondary | ICD-10-CM

## 2024-01-28 DIAGNOSIS — E782 Mixed hyperlipidemia: Secondary | ICD-10-CM | POA: Diagnosis not present

## 2024-01-28 MED ORDER — SENNA-DOCUSATE SODIUM 8.6-50 MG PO TABS: 1.0000 | ORAL_TABLET | Freq: Every day | ORAL | 3 refills | Status: DC

## 2024-01-28 MED ORDER — LANTUS SOLOSTAR 100 UNIT/ML ~~LOC~~ SOPN: PEN_INJECTOR | SUBCUTANEOUS | 3 refills | Status: DC

## 2024-01-28 MED ORDER — CYCLOBENZAPRINE HCL 5 MG PO TABS: 5.0000 mg | ORAL_TABLET | Freq: Three times a day (TID) | ORAL | 1 refills | Status: DC | PRN

## 2024-01-28 NOTE — Progress Notes (Unsigned)
 Provider: Christean Courts FNP-C   Raylynn Hersh, Elijio Guadeloupe, NP  Patient Care Team: Raynor Calcaterra, Elijio Guadeloupe, NP as PCP - General (Family Medicine) Nahser, Lela Purple, MD as PCP - Cardiology (Cardiology) Tat, Von Grumbling, DO as Consulting Physician (Neurology) [facility]  Extended Emergency Contact Information Primary Emergency Contact: Mayberry,[name] Address: [address]          [place], [place] [address] [place] Home Phone: [phone] Mobile Phone: [phone] Relation: Spouse Secondary Emergency Contact: Durning,John Mobile Phone: [phone] Relation: Other  Code Status:  Full Code  Goals of care: Advanced Directive information    01/14/2024   11:31 AM  Advanced Directives  Does Patient Have a Medical Advance Directive? No  Would patient like information on creating a medical advance directive? No - Patient declined     Chief Complaint  Patient presents with   Medical Management of Chronic Issues    Routine 6 month follow up, Discuss Covid vaccine, Discuss Annual Wellness visit, Discuss A1C and Diabetic kidney evaluation     Discussed the use of AI scribe software for clinical note transcription with the patient, who gave verbal consent to proceed.  History of Present Illness   Wendy Obrien is a 73 year old female with constipation, diabetes, and back pain who presents for a six-month follow-up.  She experiences ongoing constipation, describing it as significant discomfort and expressing feelings of depression due to the condition. She takes a stool softener and occasionally uses Dulcolax, drinks plenty of water, and eats vegetables, though she dislikes fruit. She is unable to use an enema due to the hardness of the feces. Bowel movements occur every four to five days, which she finds too infrequent, and she is currently on day four without a bowel movement. She feels bloated and notes a weight increase from 268 to 273 pounds. She  is on 20 mg of Lasix  and wears compression stockings regularly. She also takes potassium supplements.  She is concerned about her blood sugar levels, which have been waking her up at night due to low glucose readings in the sixties. She has a sensor to monitor her blood sugar and has been adjusting her insulin  dosage from 14 to 12 units in the morning, noting that her blood sugar rises slightly with the lower dose. She is also on Ozempic . She reports a previous blood sugar reading as low as 56, despite not skipping meals.  She experiences significant back pain, which she describes as sometimes making her cry and affecting her ability to move. The pain was previously diagnosed as sciatica, radiating from her buttock down her leg. She has tried Motrin  and Tylenol  without relief. She associates the pain with muscular issues, as it worsens when sitting or bending. She enjoys gardening but finds it difficult due to the pain. No numbness or tingling, and the pain does not extend to her toes.  She enjoys gardening and being outdoors, but her back pain limits her ability to engage in these activities. No abdominal pain, shortness of breath, wheezing, cough, numbness, or tingling. She reports feeling bloated and having swollen feet, with imprints from sandals visible after wearing them.    Past Medical History:  Diagnosis Date   Allergy    Anxiety    Arthritis    Cataract    CHF (congestive heart failure) (HCC)    DM (diabetes mellitus) (HCC)    Essential tremor    head   G6PD deficiency  Glaucoma    Per new patient form   High cholesterol    Per new patient form   HTN (hypertension)    Hyperlipidemia    Sickle cell trait (HCC)    Past Surgical History:  Procedure Laterality Date   ABDOMINAL HYSTERECTOMY     Dr. Bevin Bucks / Per new patient form   ANKLE SURGERY Right    x 2   BREAST LUMPECTOMY Left    benign   CATARACT EXTRACTION Right 01/04/2024   [facility]   COLONOSCOPY   2025   DG  BONE DENSITY ([facility] HX)     Per new patient form   DIAGNOSTIC MAMMOGRAM     Per new patient form   EYE SURGERY Left    age 39 - L eye removed, Dr. Diedre Fox / Per new patient form   MINOR RELEASE DORSAL COMPARTMENT (DEQUERVAINS) Left 05/09/2022   Procedure: Left dequervains release and left dorsal forearm mass excision;  Surgeon: Ltanya Rummer, MD;  Location: [facility];  Service: Orthopedics;  Laterality: Left;  Regional block   REDUCTION MAMMAPLASTY Bilateral    1998    Allergies  Allergen Reactions   Bee Venom Anaphylaxis   Beeswax Anaphylaxis    bees   Fish Allergy Anaphylaxis   Shellfish Allergy Anaphylaxis    And all seafood   Egg-Derived Products Hives   Aspirin  Other (See Comments)    G6P Deficiency   Nsaids Other (See Comments)    G6P Deficiency   Sulfa Antibiotics Other (See Comments)    G6P Deficiency     Allergies as of 01/28/2024       Reactions   Bee Venom Anaphylaxis   Beeswax Anaphylaxis   bees   Fish Allergy Anaphylaxis   Shellfish Allergy Anaphylaxis   And all seafood   Egg-derived Products Hives   Aspirin  Other (See Comments)   G6P Deficiency   Nsaids Other (See Comments)   G6P Deficiency   Sulfa Antibiotics Other (See Comments)   G6P Deficiency         Medication List        Accurate as of Jan 28, 2024  1:40 PM. If you have any questions, ask your nurse or doctor.          STOP taking these medications    BIOTIN PO Stopped by: Raziel Koenigs C Jasmia Angst   Hair/Skin/Nails/Biotin Tabs Stopped by: Elijio Guadeloupe Nechama Escutia   predniSONE  10 MG (21) Tbpk tablet Commonly known as: STERAPRED UNI-PAK 21 TAB Stopped by: Jamicheal Heard C Izora Benn   SODIUM FLUORIDE (DENTAL RINSE) 0.2 % Soln Stopped by: Laelynn Blizzard C Theodor Mustin   sodium phosphate  Enem Stopped by: Dosia Yodice C Amarii Bordas       TAKE these medications    acetaminophen  500 MG tablet Commonly known as: TYLENOL  Take 2 tablets by mouth every 6 (six) hours as needed.   atorvastatin  40 MG  tablet Commonly known as: LIPITOR TAKE ONE TABLET BY MOUTH EVERY DAY AT BEDTIME   B-D ULTRAFINE III SHORT PEN 31G X 8 MM Misc Generic drug: Insulin  Pen Needle SMARTSIG:injection Twice Daily DX: E11.65   BETA CAROTENE PO Take 1 tablet by mouth daily with breakfast.   busPIRone  7.5 MG tablet Commonly known as: BUSPAR  Take 1 tablet (7.5 mg total) by mouth 2 (two) times daily.   cyclobenzaprine  5 MG tablet Commonly known as: FLEXERIL  Take 1 tablet (5 mg total) by mouth 3 (three) times daily as needed for muscle spasms. Started by: Zoejane Gaulin C Jimmi Sidener  docusate 50 MG/5ML liquid Commonly known as: COLACE Take by mouth as needed for mild constipation. Patient takes one or twice a week as needed for constipation   EPINEPHrine  0.3 mg/0.3 mL Soaj injection Commonly known as: EPI-PEN Inject 0.3 mg into the muscle as needed for anaphylaxis.   Fish Oil 1200 MG Caps Take 1,200 mg by mouth daily with breakfast.   FreeStyle Libre 2 Sensor Misc 2 Devices by Does not apply route daily. Ell.65   furosemide  20 MG tablet Commonly known as: LASIX  Take 1 tablet (20 mg total) by mouth daily.   hydrochlorothiazide  25 MG tablet Commonly known as: HYDRODIURIL  Take 1 tablet (25 mg total) by mouth daily with breakfast. (FILL AS  WRITTEN. PATIENT REQUIRES AND TOLERATES THIS MEDICATION AND WILL BE  APPROPRIATELY MONITORED)   Lantus  SoloStar 100 UNIT/ML Solostar Pen Generic drug: insulin  glargine INJECT 12 UNITS SUBCUTANEOUSLY EVERY DAY IN THE MORNING (DISCARD 28 DAYS AFTER FIRST USE) What changed: additional instructions Changed by: Biridiana Twardowski C Terryn Redner   latanoprost  0.005 % ophthalmic solution Commonly known as: XALATAN  INSTILL 1 DROP IN RIGHT EYE EVERY DAY AT BEDTIME - (REFRIGERATE UNTIL BOTTLE IS OPENED)   losartan  50 MG tablet Commonly known as: COZAAR  TAKE ONE TABLET BY MOUTH EVERY DAY WITH BREAKFAST   multivitamin with minerals Tabs tablet Take 1 tablet by mouth daily with breakfast.    Neuriva Caps Take 1 capsule by mouth daily with breakfast.   Ozempic  (1 MG/DOSE) 4 MG/3ML Sopn Generic drug: Semaglutide  (1 MG/DOSE) INJECT 1MG  SUBCUTANEOUSLY ONCE A WEEK   polyethylene glycol powder 17 GM/SCOOP powder Commonly known as: GLYCOLAX /MIRALAX  Take 17 g and stir and dissolve in 4 to 8 ounces of liquid and drink it right away by mouth daily. Hold for loose stool.   potassium chloride  SA 20 MEQ tablet Commonly known as: KLOR-CON  M Take 1 tablet (20 mEq total) by mouth daily. Along with Furosemide    propranolol  ER 120 MG 24 hr capsule Commonly known as: INDERAL  LA TAKE ONE CAPSULE BY MOUTH EVERY DAY AT BEDTIME   sennosides-docusate sodium  8.6-50 MG tablet Commonly known as: SENOKOT-S Take 1 tablet by mouth daily. Started by: Raphel Stickles C Sallyanne Birkhead   timolol  0.5 % ophthalmic solution Commonly known as: TIMOPTIC  Place 1 drop into the right eye every morning.   traZODone  50 MG tablet Commonly known as: DESYREL  TAKE ONE TABLET BY MOUTH EVERY DAY AT BEDTIME AS NEEDED FOR SLEEP.   Zinc  50 MG Tabs Take by mouth.        Review of Systems  Constitutional:  Negative for appetite change, chills, fatigue, fever and unexpected weight change.  HENT:  Negative for congestion, dental problem, ear discharge, ear pain, facial swelling, hearing loss, nosebleeds, postnasal drip, rhinorrhea, sinus pressure, sinus pain, sneezing, sore throat, tinnitus and trouble swallowing.   Eyes:  Negative for pain, discharge, redness, itching and visual disturbance.  Respiratory:  Negative for cough, chest tightness, shortness of breath and wheezing.   Cardiovascular:  Negative for chest pain, palpitations and leg swelling.  Gastrointestinal:  Positive for constipation. Negative for abdominal distention, abdominal pain, blood in stool, diarrhea, nausea and vomiting.  Endocrine: Negative for cold intolerance, heat intolerance, polydipsia, polyphagia and polyuria.  Genitourinary:  Negative for  difficulty urinating, dysuria, flank pain, frequency and urgency.  Musculoskeletal:  Positive for arthralgias and back pain. Negative for gait problem, joint swelling, myalgias, neck pain and neck stiffness.  Skin:  Negative for color change, pallor, rash and wound.  Neurological:  Negative for  dizziness, syncope, speech difficulty, weakness, light-headedness, numbness and headaches.  Hematological:  Does not bruise/bleed easily.  Psychiatric/Behavioral:  Negative for agitation, behavioral problems, confusion, hallucinations, self-injury, sleep disturbance and suicidal ideas. The patient is not nervous/anxious.     Immunization History  Administered Date(s) Administered   Fluad Trivalent(High Dose 65+) 05/22/2023   Influenza,trivalent, recombinat, inj, PF 08/07/2022   Moderna Covid-19 Vaccine Bivalent Booster 52yrs & up 05/22/2023   PFIZER(Purple Top)SARS-COV-2 Vaccination 09/14/2019, 10/22/2019, 06/26/2020, 01/19/2021, 06/21/2021, 06/14/2022   PNEUMOCOCCAL CONJUGATE-20 01/31/2022   Pfizer(Comirnaty)Fall Seasonal Vaccine 12 years and older 11/27/2022, 11/28/2022   Pneumococcal Polysaccharide-23 06/11/2020   Respiratory Syncytial Virus Vaccine,Recomb Aduvanted(Arexvy) 06/15/2022   Td 11/28/2022   Tdap 11/27/2022   Zoster Recombinant(Shingrix) 09/14/2017, 12/03/2017   Pertinent  Health Maintenance Due  Topic Date Due   HEMOGLOBIN A1C  08/10/2023   FOOT EXAM  02/29/2024   INFLUENZA VACCINE  04/11/2024   OPHTHALMOLOGY EXAM  06/04/2024   MAMMOGRAM  03/20/2025   Colonoscopy  09/23/2028   DEXA SCAN  Completed      07/30/2023   11:07 AM 10/10/2023    2:17 PM 12/10/2023    1:19 PM 01/14/2024   11:30 AM 01/28/2024   12:56 PM  Fall Risk  Falls in the past year? 0 0 0 0 0  Was there an injury with Fall? 0 0 0 0 0  Fall Risk Category Calculator 0 0 0 0 0  Patient at Risk for Falls Due to No Fall Risks  No Fall Risks No Fall Risks No Fall Risks  Fall risk Follow up   Falls evaluation  completed Falls evaluation completed Falls evaluation completed   Functional Status Survey:    Vitals:   01/28/24 1300  BP: 126/80  Pulse: 60  Resp: 20  Temp: 98 F (36.7 C)  SpO2: 98%  Weight: 273 lb 12.8 oz (124.2 kg)  Height: 5\' 8"  (1.727 m)   Body mass index is 41.63 kg/m. Physical Exam VITALS: T- 98.0, P- 60, BP- 126/80, SaO2- 98% MEASUREMENTS: Weight- 273. GENERAL: Alert, cooperative, well developed, no acute distress HEENT: Normocephalic, normal oropharynx, moist mucous membranes, nose normal NECK: Thyroid  normal CHEST: Clear to auscultation bilaterally, no wheezes, rhonchi, or crackles CARDIOVASCULAR: Normal heart rate and rhythm, S1 and S2 normal without murmurs ABDOMEN: Soft, non-tender, non-distended, without organomegaly, normal bowel sounds EXTREMITIES: No cyanosis or edema NEUROLOGICAL: Cranial nerves grossly intact, moves all extremities without gross motor or sensory deficit  SKIN: No rash,no lesion or erythema   PSYCHIATRY/BEHAVIORAL: Mood stable    Labs reviewed: Recent Labs    02/07/23 0936  NA 140  K 4.1  CL 104  CO2 28  GLUCOSE 159*  BUN 15  CREATININE 0.85  CALCIUM  9.5   Recent Labs    02/07/23 0936  AST 21  ALT 19  BILITOT 0.7  PROT 6.8   Recent Labs    02/07/23 0936  WBC 6.7  NEUTROABS 3,357  HGB 13.4  HCT 41.0  MCV 96.7  PLT 199   Lab Results  Component Value Date   TSH 2.97 02/07/2023   Lab Results  Component Value Date   HGBA1C 6.3 (H) 02/07/2023   Lab Results  Component Value Date   CHOL 114 02/07/2023   HDL 42 (L) 02/07/2023   LDLCALC 49 02/07/2023   TRIG 152 (H) 02/07/2023   CHOLHDL 2.7 02/07/2023    Significant Diagnostic Results in last 30 days:  No results found.  Assessment/Plan Assessment and Plan  Constipation Chronic constipation with bowel movements every 4-5 days, accompanied by bloating and discomfort. Current regimen includes stool softeners and occasional Dulcolax. High fluid intake and  vegetable consumption. Difficulty with enemas due to hard stools. Lasix  may contribute by reducing fluid levels. - Recommend Senokot (Senna), starting with one tablet, increasing to two if needed. - Advise use of Miralax  as needed, mixed in water, to aid bowel movements. - Monitor bowel movement frequency and consider further evaluation, such as an x-ray, if no improvement.  Edema Persistent lower extremity edema despite Lasix  (furosemide ) 20 mg. Swelling in feet with sandal imprints. Regular use of compression stockings. No shortness of breath, wheezing, or cough, indicating no significant pulmonary fluid overload. - Continue Lasix  20 mg regimen. - Continue regular use of compression stockings. - Monitor for signs of fluid overload, such as shortness of breath or wheezing.  Hypoglycemia Nocturnal hypoglycemia with blood glucose levels in the 60s, occasionally as low as 56. Currently on 14 units of insulin  in the morning, self-adjusted to 12 units. Recent increase in Ozempic  dose contributing to lower glucose levels. - Reduce insulin  dose to 12 units in the morning. - Monitor blood glucose levels closely, especially at night. - If hypoglycemia persists, further reduce insulin  by 2 units and notify provider.  Sciatica Chronic lower back pain with radiation to the buttock and upper leg, consistent with sciatica. Pain exacerbated by sitting and bending, suggesting muscular involvement. Previous prednisone  treatment not tolerated due to hyperglycemia. No numbness or tingling reported. - Prescribe Flexeril  (cyclobenzaprine ) for muscle relaxation, up to three times daily as needed, with caution about potential drowsiness. - Recommend online stretching exercises for the lower back. - Encourage walking to alleviate symptoms.  General Health Maintenance Up to date on immunizations except for COVID-19 booster. Due for Medicare wellness visit and routine lab work including A1c, cholesterol, and vitamin  D levels. Recent glaucoma surgery noted. - Schedule routine lab work including A1c, cholesterol, and vitamin D levels. - Schedule Medicare wellness visit, with option for video consultation. - Confirm and update COVID-19 vaccination status.  Follow-up Plans for ongoing management of conditions and routine health maintenance. - Schedule six-month follow-up appointment. - Arrange morning lab work, fasting if possible. - Coordinate with front desk for lab appointment and Medicare wellness visit scheduling.          Family/ staff Communication: Reviewed plan of care with patient  Labs/tests ordered: None   Next Appointment : Return in about 6 months (around 07/30/2024) for medical mangement of chronic issues., Fasting labs in the morning, Annual wellness visit soon.   Spent 30 minutes of Face to face and non-face to face with patient  >50% time spent counseling; reviewing medical record; tests; labs; documentation and developing future plan of care.   Estil Heman, NP

## 2024-01-31 DIAGNOSIS — E1165 Type 2 diabetes mellitus with hyperglycemia: Secondary | ICD-10-CM

## 2024-01-31 MED ORDER — LANTUS SOLOSTAR 100 UNIT/ML ~~LOC~~ SOPN: PEN_INJECTOR | SUBCUTANEOUS | 3 refills | Status: DC

## 2024-01-31 MED ORDER — FREESTYLE LIBRE 2 SENSOR MISC: 12 refills | Status: DC

## 2024-02-19 ENCOUNTER — Ambulatory Visit (INDEPENDENT_AMBULATORY_CARE_PROVIDER_SITE_OTHER): Admitting: Dermatology

## 2024-02-19 ENCOUNTER — Encounter: Payer: Self-pay | Admitting: Dermatology

## 2024-02-19 DIAGNOSIS — L820 Inflamed seborrheic keratosis: Secondary | ICD-10-CM | POA: Diagnosis not present

## 2024-02-19 DIAGNOSIS — L821 Other seborrheic keratosis: Secondary | ICD-10-CM | POA: Diagnosis not present

## 2024-02-19 NOTE — Progress Notes (Signed)
   Follow-Up Visit   Subjective  Wendy Obrien is a 73 y.o. female who presents for the following: lesion on her chest. It has been present for 1 month and has been itchy. States that it has changed in size and color.   The patient has spots, moles and lesions to be evaluated, some may be new or changing.  The following portions of the chart were reviewed this encounter and updated as appropriate: medications, allergies, medical history  Review of Systems:  No other skin or systemic complaints except as noted in HPI or Assessment and Plan.  Objective  Well appearing patient in no apparent distress; mood and affect are within normal limits.   A focused examination was performed of the following areas: Chest face  Relevant exam findings are noted in the Assessment and Plan.  Chest - Medial (Center) (5) Inflamed stuck on papule   Assessment & Plan   SEBORRHEIC KERATOSIS - Stuck-on, waxy, tan-brown papules and/or plaques  - Benign-appearing - Discussed benign etiology and prognosis. - Observe - Call for any changes  INFLAMED SEBORRHEIC KERATOSIS (5) Chest - Medial (Center) (5) Destruction of lesion - Chest - Medial (Center) (5) Complexity: simple   Destruction method: cryotherapy   Informed consent: discussed and consent obtained   Timeout:  patient name, date of birth, surgical site, and procedure verified Lesion destroyed using liquid nitrogen: Yes   Region frozen until ice ball extended beyond lesion: Yes   Cryotherapy cycles:  1 Outcome: patient tolerated procedure well with no complications   Post-procedure details: wound care instructions given   DERMATOSIS PAPULOSA NIGRA   SEBORRHEIC KERATOSES    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 DPN is more  commonly seen in individuals with darker skin tones and is considered a variant of seborrheic keratosis. The lesions are asymptomatic, though they may occasionally become irritated or cosmetically bothersome. It was emphasized that DPN is not cancerous and does not require treatment unless for cosmetic reasons or if the lesions become irritated. Treatment options, if desired, include electrocautery, cryotherapy, laser therapy, or curettage, though these procedures carry a risk of post-inflammatory hyperpigmentation or scarring, particularly in patients with skin of color. The patient was reassured regarding the benign nature of the condition and advised that any changes in the lesions--such as rapid growth, bleeding, or irregular borders--should be promptly evaluated. Patient will schedule cosmetic removal of lesions.  Return for Cosmetic visit .  Maurine Sovereign, RN, am acting as scribe for Deneise Finlay, MD .   Documentation: I have reviewed the above documentation for accuracy and completeness, and I agree with the above.  Deneise Finlay, MD

## 2024-02-19 NOTE — Patient Instructions (Addendum)
 Dermatosis Papulosa Nigra  Skin Care: Dermatosis Papulosa Wendy Obrien can resolve with light electrodesiccation. Expectations: Dermatosis Papulosa Wendy Obrien are benign growths. No treatment is necessary. Plan: Cosmetic Quote The patient received the following cosmetic quote   Other Procedure(Skin Tags): Quote for cosmetic removal:  $200 to remove on 1 area    I counseled the patient regarding the following: Skin Care: Seborrheic Keratoses are benign. No treatment is necessary. Expectations: Seborrheic Keratoses are benign warty growths. Patients get more of them as they age.    For areas treated with Liquid Nitrogen:  Keep clean with soap and water.  Apply Vaseline or Aquaphor twice daily.  Important Information  Due to recent changes in healthcare laws, you may see results of your pathology and/or laboratory studies on MyChart before the doctors have had a chance to review them. We understand that in some cases there may be results that are confusing or concerning to you. Please understand that not all results are received at the same time and often the doctors may need to interpret multiple results in order to provide you with the best plan of care or course of treatment. Therefore, we ask that you please give us  2 business days to thoroughly review all your results before contacting the office for clarification. Should we see a critical lab result, you will be contacted sooner.   If You Need Anything After Your Visit  If you have any questions or concerns for your doctor, please call our main line at [phone] If no one answers, please leave a voicemail as directed and we will return your call as soon as possible. Messages left after 4 pm will be answered the following business day.   You may also send us  a message via MyChart. We typically respond to MyChart messages within 1-2 business days.  For prescription refills, please ask your pharmacy to contact our office. Our fax number is  [phone].  If you have an urgent issue when the clinic is closed that cannot wait until the next business day, you can page your doctor at the number below.    Please note that while we do our best to be available for urgent issues outside of office hours, we are not available 24/7.   If you have an urgent issue and are unable to reach us , you may choose to seek medical care at your doctor's office, retail clinic, urgent care center, or emergency room.  If you have a medical emergency, please immediately call 911 or go to the emergency department. In the event of inclement weather, please call our main line at [phone] for an update on the status of any delays or closures.  Dermatology Medication Tips: Please keep the boxes that topical medications come in in order to help keep track of the instructions about where and how to use these. Pharmacies typically print the medication instructions only on the boxes and not directly on the medication tubes.   If your medication is too expensive, please contact our office at [phone] or send us  a message through MyChart.   We are unable to tell what your co-pay for medications will be in advance as this is different depending on your insurance coverage. However, we may be able to find a substitute medication at lower cost or fill out paperwork to get insurance to cover a needed medication.   If a prior authorization is required to get your medication covered by your insurance company, please allow us  1-2 business days to complete  this process.  Drug prices often vary depending on where the prescription is filled and some pharmacies may offer cheaper prices.  The website www.goodrx.com contains coupons for medications through different pharmacies. The prices here do not account for what the cost may be with help from insurance (it may be cheaper with your insurance), but the website can give you the price if you did not use any insurance.  -  You can print the associated coupon and take it with your prescription to the pharmacy.  - You may also stop by our office during regular business hours and pick up a GoodRx coupon card.  - If you need your prescription sent electronically to a different pharmacy, notify our office through [facility] or by phone at [phone]

## 2024-02-20 ENCOUNTER — Other Ambulatory Visit: Payer: Self-pay | Admitting: Family

## 2024-02-20 DIAGNOSIS — E1165 Type 2 diabetes mellitus with hyperglycemia: Secondary | ICD-10-CM

## 2024-02-20 NOTE — Telephone Encounter (Signed)
 Please advise new script for Rx

## 2024-04-02 ENCOUNTER — Encounter: Payer: Self-pay | Admitting: Dermatology

## 2024-04-02 ENCOUNTER — Ambulatory Visit (INDEPENDENT_AMBULATORY_CARE_PROVIDER_SITE_OTHER): Payer: Self-pay | Admitting: Dermatology

## 2024-04-02 DIAGNOSIS — L821 Other seborrheic keratosis: Secondary | ICD-10-CM

## 2024-04-02 NOTE — Patient Instructions (Signed)

## 2024-04-02 NOTE — Progress Notes (Unsigned)
   Follow-Up Visit   Subjective  Wendy Obrien is a 73 y.o. 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 ($100 deposit, $100 today)  The following portions of the chart were reviewed this encounter and updated as appropriate: medications, allergies, medical history  Review of Systems:  No other skin or systemic complaints except as noted in HPI or Assessment and Plan.  Objective  Well appearing patient in no apparent distress; mood and affect are within normal limits.  A focused examination was performed of the following areas: Face  Relevant exam findings are noted in the Assessment and Plan.    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 Recommend Vaseline ointment.  Return as needed for another cosmetic treatment.SABRA LILLETTE Rollene Charlann, RN, am acting as scribe for RUFUS CHRISTELLA HOLY, MD .   Documentation: I have reviewed the above documentation for accuracy and completeness, and I agree with the above.  RUFUS CHRISTELLA HOLY, MD

## 2024-04-15 DIAGNOSIS — Z91030 Bee allergy status: Secondary | ICD-10-CM

## 2024-04-16 MED ORDER — EPINEPHRINE 0.3 MG/0.3ML IJ SOAJ: 0.3000 mg | INTRAMUSCULAR | 11 refills | Status: AC | PRN

## 2024-04-16 NOTE — Telephone Encounter (Signed)
 Ordered patient EPINEPHrine  0.3 mg/0.3 mL IJ SOAJ injection  sent to her preferred pharmacy.

## 2024-05-01 ENCOUNTER — Other Ambulatory Visit: Payer: Self-pay | Admitting: Family

## 2024-05-01 DIAGNOSIS — I10 Essential (primary) hypertension: Secondary | ICD-10-CM

## 2024-05-19 ENCOUNTER — Other Ambulatory Visit: Payer: Self-pay | Admitting: Family

## 2024-05-19 DIAGNOSIS — Z1231 Encounter for screening mammogram for malignant neoplasm of breast: Secondary | ICD-10-CM

## 2024-05-20 ENCOUNTER — Telehealth: Payer: Self-pay

## 2024-05-20 DIAGNOSIS — Z23 Encounter for immunization: Secondary | ICD-10-CM

## 2024-05-20 NOTE — Telephone Encounter (Signed)
 Copied from CRM [id number]. Topic: Clinical - Medical Advice >> May 20, 2024  3:47 PM Merlynn A wrote: Reason for CRM: Patient called in with questions bout flu shot and COVID vaccine. Please contact patient at [phone]

## 2024-05-20 NOTE — Telephone Encounter (Signed)
 MyChart message sent to patient.

## 2024-05-21 ENCOUNTER — Other Ambulatory Visit: Payer: Self-pay | Admitting: Family

## 2024-05-21 DIAGNOSIS — E1165 Type 2 diabetes mellitus with hyperglycemia: Secondary | ICD-10-CM

## 2024-05-21 NOTE — Telephone Encounter (Signed)
 Message routed to PCP Ngetich, Roxan BROCKS, NP. Please advise on patient question. Also I will notify her of what vaccines we offer in office.

## 2024-05-22 MED ORDER — COVID-19 MRNA VAC-TRIS(PFIZER) 30 MCG/0.3ML IM SUSY: 0.3000 mL | PREFILLED_SYRINGE | Freq: Once | INTRAMUSCULAR | 0 refills | Status: AC

## 2024-05-22 MED ORDER — FREESTYLE LIBRE 2 PLUS SENSOR MISC: 1 refills | Status: DC

## 2024-05-22 NOTE — Addendum Note (Signed)
 Addended by: ARMOND REMAK E on: 05/22/2024 10:33 AM   Modules accepted: Orders

## 2024-05-22 NOTE — Telephone Encounter (Signed)
 MyChart message sent to patient notifying her of Covid vaccine being sent into pharmacy.

## 2024-05-22 NOTE — Telephone Encounter (Signed)
 Pharmacy comment: Manufacturer is discontinuing GLUCOSE SENSOR FREESTYLE LIBRE 2 on 06-10-24. [facility] will stock GLUCOSE SENSOR FREESTYLE LIBRE 2 PLUS and LIBRE 3 PLUS. Please send new RX to [facility] [id number] for new product (change every 15 days).

## 2024-05-26 MED ORDER — FREESTYLE LIBRE 3 PLUS SENSOR MISC: 1.0000 | Freq: Every day | 3 refills | Status: DC

## 2024-05-26 MED ORDER — FREESTYLE LIBRE 3 READER DEVI: 1.0000 | Freq: Every day | 11 refills | Status: DC

## 2024-05-26 NOTE — Addendum Note (Signed)
 Addended by: SUELLEN COMINGS C on: 05/26/2024 01:00 PM   Modules accepted: Orders

## 2024-05-30 ENCOUNTER — Ambulatory Visit
Admission: RE | Admit: 2024-05-30 | Discharge: 2024-05-30 | Disposition: A | Discharge status: Home / Self Care | Source: Ambulatory Visit | Attending: Family | Admitting: Family

## 2024-05-30 DIAGNOSIS — Z1231 Encounter for screening mammogram for malignant neoplasm of breast: Secondary | ICD-10-CM

## 2024-06-27 ENCOUNTER — Ambulatory Visit: Payer: Self-pay

## 2024-06-27 NOTE — Telephone Encounter (Signed)
 FYI Only or Action Required?: FYI only for provider.  Patient was last seen in primary care on 01/28/2024 by Ngetich, Roxan BROCKS, NP.  Called Nurse Triage reporting Burn.  Symptoms began several days ago.  Interventions attempted: Other: mouthwash.  Symptoms are: gradually worsening.  Triage Disposition: Go to ED Now (Notify PCP)  Patient/caregiver understands and will follow disposition?: No, wishes to speak with PCP  PT states she doesn't want to go to the ER/UC. Rn was finding closest [facility] [address] [facility] for patient when she hung up.     Copied from CRM #[id number]. Topic: Clinical - Red Word Triage >> Jun 27, 2024 11:48 AM DeAngela L wrote: Red Word that prompted transfer to Nurse Triage: the patient might have a burn in her mouth she has a discoloration white and kind of green on the right side  2 days she drank a really coffee a very liquid and this was the result   Pt num [phone] (M) Reason for Disposition  [1] Caused by very hot substance AND [2] center of burn is white (or charred)  Answer Assessment - Initial Assessment Questions Pt states this morning while plucking her eyebrows she looked at her tongue and noticed it was discolored where she burned it with white and green mucus. She scraped it off with a popsicle stick. States the roof of her mouth is also burned. She is diabetic.   1. ONSET: When did it happen? If happened < 3 hours ago, ask: Did you apply cool water? If not, give First Aid Advice immediately.      2-3 days ago 2. LOCATION: Where is the burn located?      Tongue and roof of mouth 3. BURN SIZE: How large is the burn?  The palm is roughly 0.5% of the total body surface area (BSA).     Pretty big 3-4 inches 4. SEVERITY OF THE BURN: Are there any blisters? What size are they? (e.g., quarter equals 1 inch or 2.5 cm) Are any of the blisters broken (open or wrinkled)?     No blister but open area  5. MECHANISM: Tell me how it happened.     Drinking hot  coffee 6. PAIN: Are you having any pain? How bad is the pain? (Scale 0-10; or none, mild, moderate, severe)     Less than it has been 5 today was 10 the other day  8. OTHER SYMPTOMS: Do you have any other symptoms? (e.g., headache, nausea)     Stomach ache  Protocols used: Burns - Thermal-A-AH

## 2024-06-27 NOTE — Telephone Encounter (Signed)
 Message routed to covering provider Jereld Delude, NP

## 2024-06-27 NOTE — Telephone Encounter (Signed)
 Message routed to [employer] staff as high priority. Please call patient and have her schedule to be seen in office with whoever has availability.

## 2024-06-27 NOTE — Telephone Encounter (Signed)
 Noted

## 2024-06-27 NOTE — Telephone Encounter (Signed)
 Message routed to covering provider as Wendy Obrien.

## 2024-06-27 NOTE — Telephone Encounter (Signed)
 Patient needs to be seen in person for evaluation, needs  to go to urgent care.

## 2024-06-27 NOTE — Telephone Encounter (Signed)
 Wendy Moats, RN to [facility]  (Selected Message)     06/27/24 12:06 PM Refused UC/ER

## 2024-06-27 NOTE — Telephone Encounter (Signed)
 Nicholaus Pao A to Me  McGill, Donnal  (Selected Message) KD    06/27/24  2:37 PM Spoke to patient stated that she went to UC and was given antibiotics and did not need anything further

## 2024-07-12 DIAGNOSIS — I10 Essential (primary) hypertension: Secondary | ICD-10-CM

## 2024-07-12 DIAGNOSIS — E1165 Type 2 diabetes mellitus with hyperglycemia: Secondary | ICD-10-CM

## 2024-07-14 MED ORDER — LOSARTAN POTASSIUM 50 MG PO TABS: 50.0000 mg | ORAL_TABLET | Freq: Every day | ORAL | 1 refills | Status: AC

## 2024-07-15 ENCOUNTER — Other Ambulatory Visit: Payer: Self-pay

## 2024-07-15 DIAGNOSIS — E1165 Type 2 diabetes mellitus with hyperglycemia: Secondary | ICD-10-CM

## 2024-07-15 MED ORDER — FREESTYLE LIBRE 2 READER DEVI: 1.0000 | Freq: Every day | 0 refills | Status: AC

## 2024-07-15 MED ORDER — FREESTYLE LIBRE 2 PLUS SENSOR MISC: 3 refills | Status: AC

## 2024-07-15 NOTE — Telephone Encounter (Signed)
 Per fax from pharmacy, patient will not have a copay if a new rx can be sent for [facility] 2 plus. Spoke with patient and she is in agreement with this request

## 2024-07-18 ENCOUNTER — Ambulatory Visit: Payer: Self-pay

## 2024-07-18 NOTE — Telephone Encounter (Signed)
 FYI Only or Action Required?: Action required by provider: update on patient condition.  Patient was last seen in primary care on 01/28/2024 by Ngetich, Wendy BROCKS, NP.  Called Nurse Triage reporting Hypoglycemia.  Symptoms began several days ago.  Interventions attempted: Rest, hydration, or home remedies.  Symptoms are: stable.  Triage Disposition: Call PCP Within 24 Hours  Patient/caregiver understands and will follow disposition?:   Message from Wendy Obrien sent at 07/18/2024  2:16 PM EST  Reason for Triage: Patient is diabetic, taking insulin  and ozempic  last 3 weeks her blood sugar was running low and the alarm goes off She was advised to drop insulin  by a couple of units by her PCP and is now taking 6 units a day and today blood sugar fell to 54 and fell extremely ill. She would like to see Wendy Obrien and get her blood sugar regulated because she's finding life extremely hard with her levels always being low   Reason for Disposition  [1] Blood glucose 70 mg/dL (3.9 mmol/L) or below OR symptomatic, now improved with Care Advice AND [2] cause unknown  Answer Assessment - Initial Assessment Questions Patient endorses having low blood sugar readings every other day. Reports blood sugar today of 54 while she was out running errands. Patient endorses eating lunch when she got home. Current blood sugar is 130. Patient states she is on lantus  and ozempic . Patient currently using a continuous blood glucose sensor. Did ask if patient had the ability to prick her finger and check with a monitor. Patient reports she had pulled out her old monitor which she found was dead. Patient states she will go and buy a new monitor to be able to stick her finger and compare her blood sugar from a finger stick vs the sensor. Did ask patient if she would like to be seen in the office this coming week but patient states not at this time. She will call back on Monday if anything changes.   1. SYMPTOMS: What symptoms are  you concerned about?     Low blood sugar readings every other day. 2. ONSET:  When did the symptoms start?     Dizziness, lightheadedness 3. BLOOD GLUCOSE: What is your blood glucose level?      Blood sugar today was 54 at its lowest. Blood sugar was 130. 4. USUAL RANGE: What is your blood glucose level usually? (e.g., usual fasting morning value, usual evening value)     100-130 5. TYPE 1 or 2:  Do you know what type of diabetes you have?  (e.g., Type 1, Type 2, Gestational; doesn't know)      Type 2 6. INSULIN : Do you take insulin ? What type of insulin (s) do you use? What is the mode of delivery? (syringe, pen; injection or pump) When did you last give yourself an insulin  dose? (i.e., time or hours/minutes ago) How much did you give? (i.e., how many units)     Lantus  and ozempic  7. DIABETES PILLS: Do you take any pills for your diabetes? If Yes, ask: What is the name of the medicine(s) that you take for high blood sugar?     no 8. OTHER SYMPTOMS: Do you have any symptoms? (e.g., fever, frequent urination, difficulty breathing, vomiting)     no 9. LOW BLOOD GLUCOSE TREATMENT: What have you done so far to treat the low blood glucose level?     Eat lunch which brought her blood sugar up 10. FOOD: When did you last eat or drink?  Chicken salad 11. ALONE: Are you alone right now or is someone with you?        Lives with husband.  Protocols used: Diabetes - Low Blood Sugar-A-AH

## 2024-07-18 NOTE — Telephone Encounter (Signed)
 Message from [place] C sent at 07/18/2024  2:16 PM EST  Reason for Triage: Patient is diabetic, taking insulin  and ozempic  last 3 weeks her blood sugar was running low and the alarm goes off She was advised to drop insulin  by a couple of units by her PCP and is now taking 6 units a day and today blood sugar fell to 54 and fell extremely ill. She would like to see Dinah and get her blood sugar regulated because she's finding life extremely hard with her levels always being low    Phone call placed to patient-no answer but voicemail left for patient to call back to Nurse Triage.

## 2024-07-18 NOTE — Telephone Encounter (Signed)
 Reason for Triage: Patient is diabetic, taking insulin  and ozempic   last 3 weeks her blood sugar was running low and the alarm goes off  She was advised to drop insulin  by a couple of units by her PCP and is now taking 6 units a day and today blood sugar fell to 54 and fell extremely ill. She would like to see Dinah and get her blood sugar regulated because she's finding life extremely hard with her levels always being low   Phone call placed to patient-no answer but able to leave a voicemail for patient to call back to Nurse Triage.

## 2024-07-21 NOTE — Telephone Encounter (Signed)
 I left a detailed voicemail for patient to return call to schedule an appointment with Ngetich, Dinah C, NP or one of the other NP's, patient could be seen as early as today (Video or In person)

## 2024-07-24 ENCOUNTER — Ambulatory Visit: Payer: Self-pay

## 2024-07-24 ENCOUNTER — Ambulatory Visit (INDEPENDENT_AMBULATORY_CARE_PROVIDER_SITE_OTHER): Admitting: Family

## 2024-07-24 ENCOUNTER — Encounter: Payer: Self-pay | Admitting: Family

## 2024-07-24 VITALS — BP 128/76 | HR 60 | Temp 97.6°F | Resp 20 | Ht 68.0 in | Wt 266.0 lb

## 2024-07-24 DIAGNOSIS — B379 Candidiasis, unspecified: Secondary | ICD-10-CM

## 2024-07-24 DIAGNOSIS — Z7985 Long-term (current) use of injectable non-insulin antidiabetic drugs: Secondary | ICD-10-CM

## 2024-07-24 DIAGNOSIS — E1165 Type 2 diabetes mellitus with hyperglycemia: Secondary | ICD-10-CM | POA: Diagnosis not present

## 2024-07-24 MED ORDER — NYSTATIN 100000 UNIT/GM EX CREA: 1.0000 | TOPICAL_CREAM | Freq: Two times a day (BID) | CUTANEOUS | 3 refills | Status: AC

## 2024-07-24 MED ORDER — SEMAGLUTIDE (2 MG/DOSE) 8 MG/3ML ~~LOC~~ SOPN: 2.0000 mg | PEN_INJECTOR | SUBCUTANEOUS | 3 refills | Status: AC

## 2024-07-24 NOTE — Telephone Encounter (Signed)
 FYI Only or Action Required?: Action required by provider: clinical question for provider.  Patient was last seen in primary care on 01/28/2024 by Ngetich, Roxan BROCKS, NP.  Called Nurse Triage reporting Rash.  Symptoms began a week ago.  Interventions attempted: OTC medications: Antifungal powder and ointment.  Symptoms are: gradually worsening.  Triage Disposition: See Physician Within 24 Hours  Patient/caregiver understands and will follow disposition?: Yes           Copied from CRM [id number]. Topic: Clinical - Red Word Triage >> Jul 24, 2024  8:14 AM Merlynn A wrote: Red Word that prompted transfer to Nurse Triage: Rash all over body +1 Week Reason for Disposition  SEVERE itching (i.e., interferes with sleep, normal activities or school)  Answer Assessment - Initial Assessment Questions Pt has do cancel conference call on the 19th inquiring if she can discuss issues today at appointment. Pt also inquiring about possible blood work as well at today's appointment.   1. APPEARANCE of RASH: What does the rash look like? (e.g., blisters, dry flaky skin, red spots, redness, sores)     Dark in color  2. LOCATION: Where is the rash located?     Under belly , armpits, and in groin area  3. COLOR: What color is the rash? (Note: It is difficult to assess rash color in people with darker-colored skin. When this situation occurs, simply ask the caller to describe what they see.)     She just states the area is dark in color denies redness  4. ONSET: When did the rash begin?     Over a week  5. FEVER: Do you have a fever? If Yes, ask: What is your temperature, how was it measured, and when did it start?     Denies  6. ITCHING: Does the rash itch? If Yes, ask: How bad is the itch? (Scale 1-10; or mild, moderate, severe)     Moderate  7. MEDICINE FACTORS: Have you started any new medicines within the last 2 weeks? (e.g., antibiotics)      Denies  8. OTHER SYMPTOMS: Do  you have any other symptoms? (e.g., dizziness, headache, sore throat, joint pain)       Denies   Used generic antifungal ointment and powder for symptoms with no relief.  Protocols used: Rash or Redness - [facility]

## 2024-07-24 NOTE — Progress Notes (Signed)
 Provider: Roxan Plough FNP-C  Johm Pfannenstiel, Roxan BROCKS, NP  Patient Care Team: Eboni Coval, Roxan BROCKS, NP as PCP - General (Family Medicine) Nahser, Aleene PARAS, MD (Inactive) as PCP - Cardiology (Cardiology) Tat, Asberry RAMAN, DO as Consulting Physician (Neurology) [facility]  Extended Emergency Contact Information Primary Emergency Contact: Gaut,[name] Address: [address]          [place], [place] [address] [place] Home Phone: [phone] Mobile Phone: [phone] Relation: Spouse Secondary Emergency Contact: Durning,John Mobile Phone: [phone] Relation: Other  Code Status:  Full Code  Goals of care: Advanced Directive information    07/24/2024   11:24 AM  Advanced Directives  Does Patient Have a Medical Advance Directive? No  Would patient like information on creating a medical advance directive? No - Patient declined     Chief Complaint  Patient presents with   Rash    Discussed the use of AI scribe software for clinical note transcription with the patient, who gave verbal consent to proceed.  History of Present Illness   Wendy Obrien is a 73 year old female with diabetes who presents with a rash.  She has experienced a rash for nearly two weeks, initially self-treating with over-the-counter antifungal cream and powder, suspecting a fungal infection. Despite these measures, the rash has worsened. She applies the powder at night and the cream in the morning but cannot recall the name of the cream, only that it is antifungal.  The rash is described as 'horribly itchy,' affecting her sleep, and has spread to areas including the groin, between the legs, under the breasts, and armpits. The skin in these areas has darkened. There is no associated odor, fever, or chills.  She manages her diabetes with insulin , currently at a dose of six units, and reports stable blood sugar levels, often showing a 'straight line' on her graph,  with readings around 80-85. She has been using Ozempic  at a dose of one milligram without issues such as nausea or vomiting. She mentions a recent drop in blood sugar to 60 overnight.  Her weight is 266 pounds, and she has lost almost ten pounds recently. She is busy caring for her husband, which has impacted her self-care. She uses a sensor for blood sugar monitoring, which has been problematic due to incorrect placement, leading to false alarms.   Past Medical History:  Diagnosis Date   Allergy    Anxiety    Arthritis    Cataract    CHF (congestive heart failure) (HCC)    DM (diabetes mellitus) (HCC)    Essential tremor    head   G6PD deficiency    Glaucoma    Per new patient form   High cholesterol    Per new patient form   HTN (hypertension)    Hyperlipidemia    Sickle cell trait    Past Surgical History:  Procedure Laterality Date   ABDOMINAL HYSTERECTOMY     Dr. Delores / Per new patient form   ANKLE SURGERY Right    x 2   BREAST LUMPECTOMY Left    benign   CATARACT EXTRACTION Right 01/04/2024   [facility]   COLONOSCOPY  2025   DG  BONE DENSITY ([facility] HX)     Per new patient form   DIAGNOSTIC MAMMOGRAM     Per new patient form   EYE SURGERY Left    age 62 - L eye removed, Dr. Ayesha Gentry / Per new  patient form   MINOR RELEASE DORSAL COMPARTMENT (DEQUERVAINS) Left 05/09/2022   Procedure: Left dequervains release and left dorsal forearm mass excision;  Surgeon: Alyse Agent, MD;  Location: [facility];  Service: Orthopedics;  Laterality: Left;  Regional block   REDUCTION MAMMAPLASTY Bilateral    1998    Allergies  Allergen Reactions   Bee Venom Anaphylaxis   Beeswax Anaphylaxis    bees   Fish Allergy Anaphylaxis   Shellfish Allergy Anaphylaxis    And all seafood   Egg Protein-Containing Drug Products Hives   Aspirin  Other (See Comments)    G6P Deficiency   Nsaids Other (See Comments)    G6P Deficiency   Sulfa Antibiotics Other  (See Comments)    G6P Deficiency     Outpatient Encounter Medications as of 07/24/2024  Medication Sig   atorvastatin  (LIPITOR) 40 MG tablet TAKE ONE TABLET BY MOUTH EVERY DAY AT BEDTIME   B-D ULTRAFINE III SHORT PEN 31G X 8 MM MISC SMARTSIG:injection Twice Daily DX: E11.65   busPIRone  (BUSPAR ) 7.5 MG tablet Take 1 tablet (7.5 mg total) by mouth 2 (two) times daily.   Continuous Glucose Receiver (FREESTYLE LIBRE 2 READER) DEVI 1 Device by Does not apply route daily. E11.65   Continuous Glucose Sensor (FREESTYLE LIBRE 2 PLUS SENSOR) MISC Change sensor every 15 days. E11.65   docusate (COLACE) 50 MG/5ML liquid Take by mouth as needed for mild constipation. Patient takes one or twice a week as needed for constipation   EPINEPHrine  0.3 mg/0.3 mL IJ SOAJ injection Inject 0.3 mg into the muscle as needed for anaphylaxis.   furosemide  (LASIX ) 20 MG tablet TAKE ONE TABLET BY MOUTH EVERY DAY AS NEEDED (Patient taking differently: Take 20 mg by mouth daily.)   hydrochlorothiazide  (HYDRODIURIL ) 25 MG tablet TAKE ONE TABLET BY MOUTH EVERY DAY WITH BREAKFAST   insulin  glargine (LANTUS  SOLOSTAR) 100 UNIT/ML Solostar Pen INJECT 12 UNITS SUBCUTANEOUSLY EVERY DAY IN THE MORNING (DISCARD 28 DAYS AFTER FIRST USE)   latanoprost  (XALATAN ) 0.005 % ophthalmic solution INSTILL 1 DROP IN RIGHT EYE EVERY DAY AT BEDTIME - (REFRIGERATE UNTIL BOTTLE IS OPENED)   losartan  (COZAAR ) 50 MG tablet Take 1 tablet (50 mg total) by mouth daily.   Misc Natural Products (NEURIVA) CAPS Take 1 capsule by mouth daily with breakfast.   Multiple Vitamin (MULTIVITAMIN WITH MINERALS) TABS tablet Take 1 tablet by mouth daily with breakfast.   nystatin cream (MYCOSTATIN) Apply 1 Application topically 2 (two) times daily.   Omega-3 Fatty Acids (FISH OIL) 1200 MG CAPS Take 1,200 mg by mouth daily with breakfast.   potassium chloride  SA (KLOR-CON  M) 20 MEQ tablet Take 1 tablet (20 mEq total) by mouth daily. Along with Furosemide    propranolol   ER (INDERAL  LA) 120 MG 24 hr capsule TAKE ONE CAPSULE BY MOUTH EVERY DAY AT BEDTIME   Semaglutide , 2 MG/DOSE, 8 MG/3ML SOPN Inject 2 mg as directed once a week.   timolol  (TIMOPTIC ) 0.5 % ophthalmic solution Place 1 drop into the right eye every morning.   traZODone  (DESYREL ) 50 MG tablet TAKE ONE TABLET BY MOUTH EVERY DAY AT BEDTIME AS NEEDED FOR SLEEP.   Zinc  50 MG TABS Take by mouth.   [DISCONTINUED] OZEMPIC , 1 MG/DOSE, 4 MG/3ML SOPN INJECT 1MG  SUBCUTANEOUSLY ONCE A WEEK   BETA CAROTENE PO Take 1 tablet by mouth daily with breakfast. (Patient not taking: Reported on 07/24/2024)   sennosides-docusate sodium  (SENOKOT-S) 8.6-50 MG tablet Take 1 tablet by mouth daily. (Patient not  taking: Reported on 07/24/2024)   No facility-administered encounter medications on file as of 07/24/2024.    Review of Systems  Constitutional:  Negative for appetite change, chills, fatigue, fever and unexpected weight change.  HENT:  Negative for congestion, dental problem, ear discharge, ear pain, hearing loss, nosebleeds, postnasal drip, rhinorrhea, sinus pressure, sinus pain, sneezing and sore throat.   Eyes:  Negative for pain, discharge, redness, itching and visual disturbance.  Respiratory:  Negative for cough, chest tightness, shortness of breath and wheezing.   Cardiovascular:  Negative for chest pain, palpitations and leg swelling.  Gastrointestinal:  Negative for abdominal distention, abdominal pain, constipation, diarrhea, nausea and vomiting.  Endocrine: Negative for cold intolerance, heat intolerance, polydipsia, polyphagia and polyuria.  Skin:  Negative for color change, pallor, rash and wound.       Rash under breast and abdominal fold and armpits   Neurological:  Negative for dizziness, weakness, light-headedness, numbness and headaches.    Immunization History  Administered Date(s) Administered   Fluad Trivalent(High Dose 65+) 05/22/2023   INFLUENZA, HIGH DOSE SEASONAL PF 07/01/2024    Influenza,trivalent, recombinat, inj, PF 08/07/2022   Moderna Covid-19 Vaccine Bivalent Booster 39yrs & up 05/22/2023   PFIZER(Purple Top)SARS-COV-2 Vaccination 09/14/2019, 10/22/2019, 06/26/2020, 01/19/2021, 06/21/2021, 06/14/2022   PNEUMOCOCCAL CONJUGATE-20 01/31/2022   Pfizer(Comirnaty)Fall Seasonal Vaccine 12 years and older 11/27/2022, 11/28/2022   Pneumococcal Polysaccharide-23 06/11/2020   Respiratory Syncytial Virus Vaccine,Recomb Aduvanted(Arexvy) 06/15/2022   Td 11/28/2022   Tdap 11/27/2022   Unspecified SARS-COV-2 Vaccination 05/22/2024   Zoster Recombinant(Shingrix) 09/14/2017, 12/03/2017   Pertinent  Health Maintenance Due  Topic Date Due   HEMOGLOBIN A1C  08/10/2023   FOOT EXAM  02/29/2024   OPHTHALMOLOGY EXAM  06/18/2025   Mammogram  05/30/2026   Colonoscopy  09/23/2028   Influenza Vaccine  Completed   DEXA SCAN  Completed      10/10/2023    2:17 PM 12/10/2023    1:19 PM 01/14/2024   11:30 AM 01/28/2024   12:56 PM 07/24/2024   11:23 AM  Fall Risk  Falls in the past year? 0 0 0 0 0  Was there an injury with Fall? 0 0 0 0 0  Fall Risk Category Calculator 0 0 0 0 0  Patient at Risk for Falls Due to  No Fall Risks No Fall Risks No Fall Risks No Fall Risks  Fall risk Follow up  Falls evaluation completed Falls evaluation completed Falls evaluation completed Falls evaluation completed   Functional Status Survey:    Vitals:   07/24/24 1127  BP: 128/76  Pulse: 60  Resp: 20  Temp: 97.6 F (36.4 C)  SpO2: 95%  Weight: 266 lb (120.7 kg)  Height: 5' 8 (1.727 m)   Body mass index is 40.45 kg/m. Physical Exam  VITALS: T- 97.6, P- 60, BP- 128/76, SaO2- 95% MEASUREMENTS: Weight- 266. GENERAL: Alert, cooperative, well developed, no acute distress. HEENT: Normocephalic, normal oropharynx, moist mucous membranes. CHEST: Clear to auscultation bilaterally, no wheezes, rhonchi, or crackles. CARDIOVASCULAR: Normal heart rate and rhythm, S1 and S2 normal without  murmurs. ABDOMEN: Soft, non-tender, non-distended, without organomegaly, normal bowel sounds. EXTREMITIES: No cyanosis or edema. NEUROLOGICAL: Cranial nerves grossly intact, moves all extremities without gross motor or sensory deficit. SKIN: Skin under breast and arm with darkening and red spots, indicative of yeast infection. Skin in groin area darkened, indicative of yeast infection.      Labs reviewed: No results for input(s): NA, K, CL, CO2, GLUCOSE, BUN, CREATININE, CALCIUM , MG, PHOS  in the last 8760 hours. No results for input(s): AST, ALT, ALKPHOS, BILITOT, PROT, ALBUMIN in the last 8760 hours. No results for input(s): WBC, NEUTROABS, HGB, HCT, MCV, PLT in the last 8760 hours. Lab Results  Component Value Date   TSH 2.97 02/07/2023   Lab Results  Component Value Date   HGBA1C 6.3 (H) 02/07/2023   Lab Results  Component Value Date   CHOL 114 02/07/2023   HDL 42 (L) 02/07/2023   LDLCALC 49 02/07/2023   TRIG 152 (H) 02/07/2023   CHOLHDL 2.7 02/07/2023    Significant Diagnostic Results in last 30 days:  No results found.  Assessment/Plan  Candidal intertrigo Present for almost two weeks, initially treated with over-the-counter antifungal cream and powder without resolution. Rash is itchy, spreading, and causing skin darkening, located in the groin, under the breast, and under the armpits. No fever or odor reported. Examination shows typical yeast infection with skin discoloration and red spots. Previous treatment with antifungal cream and powder provided partial relief but did not clear the infection. - Prescribed nystatin cream to be applied twice daily in the morning and evening. - Advised against using powder due to potential for clumping and moisture retention. - Recommended using Interdry material to prevent moisture accumulation in affected areas.  Type 2 diabetes mellitus with hyperglycemia Blood sugar levels are  well-controlled with current regimen, showing stable readings around 80-85 mg/dL. Insulin  dose has been reduced to 6 units. Recent sensor alarm issues resolved by proper placement. No nausea or vomiting reported with current Ozempic  dose of 1 mg. Plan to increase Ozempic  to 2 mg to aid in weight management and further control blood sugar levels. - Increased Ozempic  to 2 mg and instructed to monitor for side effects. - Continue current insulin  regimen, reducing dose if blood sugar drops below 70 mg/dL. - Advised on proper placement of glucose sensor for accurate readings. - Scheduled follow-up for six-month review and lab work.  Obesity Weight has decreased by almost ten pounds. Current Ozempic  dose is 1 mg, with plans to increase to 2 mg to aid in further weight loss. No adverse effects reported with current dose. - Increased Ozempic  to 2 mg to assist with weight management. - Advised on dietary modifications to reduce sugar intake.   Family/ staff Communication: Reviewed plan of care with patient verbalized understanding   Labs/tests ordered: None   Next Appointment: Return in about 2 weeks (around 08/07/2024) for medical mangement of chronic issues., Annual wellness visit soon .   Total time: 20 minutes. Greater than 50% of total time spent doing patient education regarding Candidal intertrigo,Type 2 diabetes mellitus.,Obesity and health maintenance including symptom/medication management.   Roxan JAYSON Plough, NP

## 2024-07-24 NOTE — Patient Instructions (Signed)
 1.Stop at check out & Schedule Annual Wellness Visit.

## 2024-07-25 ENCOUNTER — Other Ambulatory Visit: Payer: Self-pay | Admitting: Family

## 2024-07-25 DIAGNOSIS — F5101 Primary insomnia: Secondary | ICD-10-CM

## 2024-07-28 ENCOUNTER — Other Ambulatory Visit

## 2024-07-30 ENCOUNTER — Ambulatory Visit: Admitting: Family

## 2024-08-01 DIAGNOSIS — G250 Essential tremor: Secondary | ICD-10-CM

## 2024-08-04 MED ORDER — PROPRANOLOL HCL ER 120 MG PO CP24: 120.0000 mg | ORAL_CAPSULE | Freq: Every day | ORAL | 3 refills | Status: DC

## 2024-08-11 ENCOUNTER — Ambulatory Visit: Admitting: Family

## 2024-08-14 ENCOUNTER — Encounter: Payer: Self-pay | Admitting: Family

## 2024-08-14 ENCOUNTER — Ambulatory Visit: Admitting: Family

## 2024-08-14 VITALS — BP 132/78 | HR 60 | Temp 97.6°F | Resp 20 | Ht 68.0 in | Wt 264.2 lb

## 2024-08-14 DIAGNOSIS — G250 Essential tremor: Secondary | ICD-10-CM

## 2024-08-14 DIAGNOSIS — E782 Mixed hyperlipidemia: Secondary | ICD-10-CM

## 2024-08-14 DIAGNOSIS — M25552 Pain in left hip: Secondary | ICD-10-CM

## 2024-08-14 DIAGNOSIS — E1165 Type 2 diabetes mellitus with hyperglycemia: Secondary | ICD-10-CM

## 2024-08-14 DIAGNOSIS — I10 Essential (primary) hypertension: Secondary | ICD-10-CM

## 2024-08-14 DIAGNOSIS — R600 Localized edema: Secondary | ICD-10-CM

## 2024-08-14 DIAGNOSIS — K5901 Slow transit constipation: Secondary | ICD-10-CM

## 2024-08-14 DIAGNOSIS — F411 Generalized anxiety disorder: Secondary | ICD-10-CM

## 2024-08-14 MED ORDER — LANTUS SOLOSTAR 100 UNIT/ML ~~LOC~~ SOPN: 6.0000 [IU] | PEN_INJECTOR | Freq: Every day | SUBCUTANEOUS | Status: AC

## 2024-08-14 NOTE — Patient Instructions (Signed)
-   Please get left hip X-ray at [facility] at [address] then will call you with results.  

## 2024-08-15 ENCOUNTER — Ambulatory Visit: Payer: Self-pay | Admitting: Family

## 2024-08-15 DIAGNOSIS — M25552 Pain in left hip: Secondary | ICD-10-CM

## 2024-08-15 LAB — CBC WITH DIFFERENTIAL/PLATELET
Absolute Lymphocytes: 2563 {cells}/uL (ref 850–3900)
Absolute Monocytes: 576 {cells}/uL (ref 200–950)
Basophils Absolute: 29 {cells}/uL (ref 0–200)
Basophils Relative: 0.4 %
Eosinophils Absolute: 202 {cells}/uL (ref 15–500)
Eosinophils Relative: 2.8 %
HCT: 40.5 % (ref 35.9–46.0)
Hemoglobin: 13.8 g/dL (ref 11.7–15.5)
MCH: 33.3 pg — ABNORMAL HIGH (ref 27.0–33.0)
MCHC: 34.1 g/dL (ref 31.6–35.4)
MCV: 97.8 fL (ref 81.4–101.7)
MPV: 10.4 fL (ref 7.5–12.5)
Monocytes Relative: 8 %
Neutro Abs: 3830 {cells}/uL (ref 1500–7800)
Neutrophils Relative %: 53.2 %
Platelets: 202 Thousand/uL (ref 140–400)
RBC: 4.14 Million/uL (ref 3.80–5.10)
RDW: 10.9 % — ABNORMAL LOW (ref 11.0–15.0)
Total Lymphocyte: 35.6 %
WBC: 7.2 Thousand/uL (ref 3.8–10.8)

## 2024-08-15 LAB — COMPLETE METABOLIC PANEL WITHOUT GFR
AG Ratio: 1.4 (calc) (ref 1.0–2.5)
ALT: 23 U/L (ref 6–29)
AST: 23 U/L (ref 10–35)
Albumin: 4.2 g/dL (ref 3.6–5.1)
Alkaline phosphatase (APISO): 64 U/L (ref 37–153)
BUN: 9 mg/dL (ref 7–25)
CO2: 30 mmol/L (ref 20–32)
Calcium: 9.5 mg/dL (ref 8.6–10.4)
Chloride: 103 mmol/L (ref 98–110)
Creat: 0.75 mg/dL (ref 0.60–1.00)
Globulin: 3 g/dL (ref 1.9–3.7)
Glucose, Bld: 106 mg/dL — ABNORMAL HIGH (ref 65–99)
Potassium: 3.8 mmol/L (ref 3.5–5.3)
Sodium: 140 mmol/L (ref 135–146)
Total Bilirubin: 0.9 mg/dL (ref 0.2–1.2)
Total Protein: 7.2 g/dL (ref 6.1–8.1)

## 2024-08-15 LAB — HEMOGLOBIN A1C
Hgb A1c MFr Bld: 5.2 % (ref <5.7)
Mean Plasma Glucose: 103 mg/dL
eAG (mmol/L): 5.7 mmol/L

## 2024-08-15 LAB — LIPID PANEL
Cholesterol: 98 mg/dL (ref <200)
HDL: 38 mg/dL — ABNORMAL LOW (ref >=50)
LDL Cholesterol (Calc): 37 mg/dL
Non-HDL Cholesterol (Calc): 60 mg/dL (ref <130)
Total CHOL/HDL Ratio: 2.6 (calc) (ref <5.0)
Triglycerides: 157 mg/dL — ABNORMAL HIGH (ref <150)

## 2024-08-15 LAB — TSH: TSH: 0.71 m[IU]/L (ref 0.40–4.50)

## 2024-08-15 LAB — MICROALBUMIN / CREATININE URINE RATIO
Creatinine, Urine: 61 mg/dL (ref 20–275)
Microalb Creat Ratio: 18 mg/g{creat} (ref <30)
Microalb, Ur: 1.1 mg/dL

## 2024-08-17 NOTE — Progress Notes (Signed)
 Provider: Roxan Plough FNP-C   Mercades Bajaj, Roxan BROCKS, NP  Patient Care Team: Narcissa Melder, Roxan BROCKS, NP as PCP - General (Family Medicine) Nahser, Aleene PARAS, MD (Inactive) as PCP - Cardiology (Cardiology) Tat, Asberry RAMAN, DO as Consulting Physician (Neurology) [facility]  Extended Emergency Contact Information Primary Emergency Contact: Ricci,[name] Address: [address]          [place], [place] [address] [place] Home Phone: [phone] Mobile Phone: [phone] Relation: Spouse Secondary Emergency Contact: Durning,John Mobile Phone: [phone] Relation: Other  Code Status:  Full Code  Goals of care: Advanced Directive information    07/24/2024   11:24 AM  Advanced Directives  Does Patient Have a Medical Advance Directive? No  Would patient like information on creating a medical advance directive? No - Patient declined     Chief Complaint  Patient presents with   Medical Management of Chronic Issues    2 Week follow up.   Discussed the use of AI scribe software for clinical note transcription with the patient, who gave verbal consent to proceed.  History of Present Illness   Wendy Obrien is a 73 year old female who presents for a regular follow-up visit.  She has experienced significant improvement in her rash with the use of nystatin  and continues to use the medication as prescribed.  Her blood sugars have been well controlled, ranging between 80 and 102 mg/dL. However, she experiences nocturnal hypoglycemia with blood sugar levels dropping to 60-65 mg/dL around 8-9 PM, which does not wake her up. She typically eats dinner at 6:30 PM and tries to avoid bedtime snacks unless her blood sugar is low. She is currently on 8 units of Lantus  in the morning, which was previously 12 units, and 1 mg of Ozempic . She has a history of using up to 29 units of insulin  twice a day.  She takes atorvastatin  40 mg at bedtime, buspirone  7.5  mg once in the morning for anxiety, and occasionally uses Colace 50 mg or glycerin  suppositories for constipation. She takes furosemide  20 mg daily, although she is unsure if the dose should be increased due to daily foot swelling. She also takes hydrochlorothiazide  25 mg, losartan  50 mg daily, a multivitamin, omega-3 fatty acids, potassium 20 mEq, propranolol  120 mg at bedtime for essential tremors, timolol  eye drops, trazodone  as needed for sleep, and zinc .  She reports left hip pain that is not sore to touch but becomes painful when lying down or sitting in certain positions. Additionally, she experiences a burning sensation in a specific spot on the front of her leg, which is uncomfortable and occurs frequently at night. There is no associated swelling or redness.  She describes an incident where a cabinet door fell on her head, resulting in severe headaches for a week, although she did not experience dizziness, nausea, vomiting, or changes in vision. She has been using ice and Tylenol  for relief.  She experiences anxiety, particularly difficulty 'shutting off' and staying active throughout the day. She acknowledges needing to take buspirone  twice daily but currently only takes it in the morning.  She has varicose veins and reports dry skin, especially during winter. She does not check her weight daily but is aware of foot swelling that sometimes prevents her from wearing certain shoes.  No dizziness, nausea, vomiting, or changes in vision reported. She denies numbness, tingling, or pain in the legs. No issues with urination, such as burning or itching, and no allergies.  She has not noticed any swelling or redness in the areas of pain.         Past Medical History:  Diagnosis Date   Allergy    Anxiety    Arthritis    Cataract    CHF (congestive heart failure) (HCC)    DM (diabetes mellitus) (HCC)    Essential tremor    head   G6PD deficiency    Glaucoma    Per new patient form   High  cholesterol    Per new patient form   HTN (hypertension)    Hyperlipidemia    Sickle cell trait    Past Surgical History:  Procedure Laterality Date   ABDOMINAL HYSTERECTOMY     Dr. Delores / Per new patient form   ANKLE SURGERY Right    x 2   BREAST LUMPECTOMY Left    benign   CATARACT EXTRACTION Right 01/04/2024   [facility]   COLONOSCOPY  2025   DG  BONE DENSITY ([facility] HX)     Per new patient form   DIAGNOSTIC MAMMOGRAM     Per new patient form   EYE SURGERY Left    age 65 - L eye removed, Dr. Ayesha Gentry / Per new patient form   MINOR RELEASE DORSAL COMPARTMENT (DEQUERVAINS) Left 05/09/2022   Procedure: Left dequervains release and left dorsal forearm mass excision;  Surgeon: Alyse Agent, MD;  Location: [facility];  Service: Orthopedics;  Laterality: Left;  Regional block   REDUCTION MAMMAPLASTY Bilateral    1998    Allergies  Allergen Reactions   Bee Venom Anaphylaxis   Beeswax Anaphylaxis    bees   Fish Allergy Anaphylaxis   Shellfish Allergy Anaphylaxis    And all seafood   Egg Protein-Containing Drug Products Hives   Aspirin  Other (See Comments)    G6P Deficiency   Nsaids Other (See Comments)    G6P Deficiency   Sulfa Antibiotics Other (See Comments)    G6P Deficiency     Allergies as of 08/14/2024       Reactions   Bee Venom Anaphylaxis   Beeswax Anaphylaxis   bees   Fish Allergy Anaphylaxis   Shellfish Allergy Anaphylaxis   And all seafood   Egg Protein-containing Drug Products Hives   Aspirin  Other (See Comments)   G6P Deficiency   Nsaids Other (See Comments)   G6P Deficiency   Sulfa Antibiotics Other (See Comments)   G6P Deficiency         Medication List        Accurate as of August 14, 2024 11:59 PM. If you have any questions, ask your nurse or doctor.          STOP taking these medications    sennosides-docusate sodium  8.6-50 MG tablet Commonly known as: SENOKOT-S Stopped by: Annastacia Duba C Sonnie Pawloski        TAKE these medications    atorvastatin  40 MG tablet Commonly known as: LIPITOR TAKE ONE TABLET BY MOUTH EVERY DAY AT BEDTIME   B-D ULTRAFINE III SHORT PEN 31G X 8 MM Misc Generic drug: Insulin  Pen Needle SMARTSIG:injection Twice Daily DX: E11.65   busPIRone  7.5 MG tablet Commonly known as: BUSPAR  Take 1 tablet (7.5 mg total) by mouth 2 (two) times daily.   docusate 50 MG/5ML liquid Commonly known as: COLACE Take by mouth as needed for mild constipation. Patient takes one or twice a week as needed for constipation   EPINEPHrine  0.3 mg/0.3 mL Soaj injection  Commonly known as: EPI-PEN Inject 0.3 mg into the muscle as needed for anaphylaxis.   Fish Oil 1200 MG Caps Take 1,200 mg by mouth daily with breakfast.   FreeStyle Libre 2 Plus Sensor Misc Change sensor every 15 days. E11.65   FreeStyle Libre 2 Reader Devi 1 Device by Does not apply route daily. E11.65   furosemide  20 MG tablet Commonly known as: LASIX  TAKE ONE TABLET BY MOUTH EVERY DAY AS NEEDED What changed: when to take this   hydrochlorothiazide  25 MG tablet Commonly known as: HYDRODIURIL  TAKE ONE TABLET BY MOUTH EVERY DAY WITH BREAKFAST   Lantus  SoloStar 100 UNIT/ML Solostar Pen Generic drug: insulin  glargine Inject 6 Units into the skin daily. What changed:  how much to take how to take this when to take this additional instructions Changed by: Jadarion Halbig C Brayley Mackowiak   latanoprost  0.005 % ophthalmic solution Commonly known as: XALATAN  INSTILL 1 DROP IN RIGHT EYE EVERY DAY AT BEDTIME - (REFRIGERATE UNTIL BOTTLE IS OPENED)   losartan  50 MG tablet Commonly known as: COZAAR  Take 1 tablet (50 mg total) by mouth daily.   multivitamin with minerals Tabs tablet Take 1 tablet by mouth daily with breakfast.   Neuriva Caps Take 1 capsule by mouth daily with breakfast.   nystatin  cream Commonly known as: MYCOSTATIN  Apply 1 Application topically 2 (two) times daily.   potassium chloride  SA 20 MEQ  tablet Commonly known as: KLOR-CON  M Take 1 tablet (20 mEq total) by mouth daily. Along with Furosemide    propranolol  ER 120 MG 24 hr capsule Commonly known as: INDERAL  LA Take 1 capsule (120 mg total) by mouth at bedtime.   Semaglutide  (2 MG/DOSE) 8 MG/3ML Sopn Inject 2 mg as directed once a week.   timolol  0.5 % ophthalmic solution Commonly known as: TIMOPTIC  Place 1 drop into the right eye every morning.   traZODone  50 MG tablet Commonly known as: DESYREL  TAKE ONE TABLET BY MOUTH EVERY DAY AT BEDTIME AS NEEDED FOR SLEEP. What changed: See the new instructions.   Zinc  50 MG Tabs Take by mouth.        Review of Systems  Constitutional:  Negative for appetite change, chills, fatigue, fever and unexpected weight change.  HENT:  Negative for congestion, dental problem, ear discharge, ear pain, hearing loss, nosebleeds, postnasal drip, rhinorrhea, sinus pressure, sinus pain, sneezing, sore throat, tinnitus and trouble swallowing.   Eyes:  Negative for pain, discharge, redness, itching and visual disturbance.  Respiratory:  Negative for cough, chest tightness, shortness of breath and wheezing.   Cardiovascular:  Negative for chest pain, palpitations and leg swelling.  Gastrointestinal:  Negative for abdominal distention, abdominal pain, blood in stool, constipation, diarrhea, nausea and vomiting.  Endocrine: Negative for cold intolerance, heat intolerance, polydipsia, polyphagia and polyuria.  Genitourinary:  Negative for difficulty urinating, dysuria, flank pain, frequency and urgency.  Musculoskeletal:  Positive for arthralgias. Negative for back pain, gait problem, joint swelling, myalgias, neck pain and neck stiffness.       Left hip pain   Skin:  Negative for color change, pallor, rash and wound.  Neurological:  Negative for dizziness, syncope, speech difficulty, weakness, light-headedness, numbness and headaches.  Hematological:  Does not bruise/bleed easily.   Psychiatric/Behavioral:  Negative for agitation, behavioral problems, confusion, hallucinations, self-injury, sleep disturbance and suicidal ideas. The patient is not nervous/anxious.     Immunization History  Administered Date(s) Administered   Fluad Trivalent(High Dose 65+) 05/22/2023   INFLUENZA, HIGH DOSE SEASONAL PF 07/01/2024  Influenza,trivalent, recombinat, inj, PF 08/07/2022   Moderna Covid-19 Vaccine Bivalent Booster 62yrs & up 05/22/2023   PFIZER(Purple Top)SARS-COV-2 Vaccination 09/14/2019, 10/22/2019, 06/26/2020, 01/19/2021, 06/21/2021, 06/14/2022   PNEUMOCOCCAL CONJUGATE-20 01/31/2022   Pfizer(Comirnaty)Fall Seasonal Vaccine 12 years and older 11/27/2022, 11/28/2022   Pneumococcal Polysaccharide-23 06/11/2020   Respiratory Syncytial Virus Vaccine,Recomb Aduvanted(Arexvy) 06/15/2022   Td 11/28/2022   Tdap 11/27/2022   Unspecified SARS-COV-2 Vaccination 05/22/2024   Zoster Recombinant(Shingrix) 09/14/2017, 12/03/2017   Pertinent  Health Maintenance Due  Topic Date Due   FOOT EXAM  02/29/2024   HEMOGLOBIN A1C  02/12/2025   OPHTHALMOLOGY EXAM  06/18/2025   Mammogram  05/30/2026   Colonoscopy  09/23/2028   Influenza Vaccine  Completed   Bone Density Scan  Completed      10/10/2023    2:17 PM 12/10/2023    1:19 PM 01/14/2024   11:30 AM 01/28/2024   12:56 PM 07/24/2024   11:23 AM  Fall Risk  Falls in the past year? 0 0 0 0 0  Was there an injury with Fall? 0  0  0  0  0   Fall Risk Category Calculator 0 0 0 0 0  Patient at Risk for Falls Due to  No Fall Risks No Fall Risks No Fall Risks No Fall Risks  Fall risk Follow up  Falls evaluation completed Falls evaluation completed Falls evaluation completed Falls evaluation completed     Data saved with a previous flowsheet row definition   Functional Status Survey:    Vitals:   08/14/24 0942  BP: 132/78  Pulse: 60  Resp: 20  Temp: 97.6 F (36.4 C)  SpO2: 97%  Weight: 264 lb 3.2 oz (119.8 kg)  Height: 5' 8  (1.727 m)   Body mass index is 40.17 kg/m. Physical Exam GENERAL: Alert, cooperative, well developed, no acute distress HEENT: Normocephalic, normal oropharynx, moist mucous membranes, ears and nose normal, no cervical lymphadenopathy NECK: No cervical lymphadenopathy CHEST: Clear to auscultation bilaterally, no wheezes, rhonchi, or crackles CARDIOVASCULAR: Normal heart rate and rhythm, S1 and S2 normal without murmurs ABDOMEN: Soft, non-tender, non-distended, without organomegaly, normal bowel sounds EXTREMITIES: No cyanosis, legs swollen, extremities without numbness or tingling NEUROLOGICAL: Cranial nerves grossly intact, moves all extremities without gross motor or sensory deficit, sensation intact in feet  SKIN: No rash,no lesion or erythema   PSYCHIATRY/BEHAVIORAL: Mood stable   Labs reviewed: Recent Labs    08/14/24 1029  NA 140  K 3.8  CL 103  CO2 30  GLUCOSE 106*  BUN 9  CREATININE 0.75  CALCIUM  9.5   Recent Labs    08/14/24 1029  AST 23  ALT 23  BILITOT 0.9  PROT 7.2   Recent Labs    08/14/24 1029  WBC 7.2  NEUTROABS 3,830  HGB 13.8  HCT 40.5  MCV 97.8  PLT 202   Lab Results  Component Value Date   TSH 0.71 08/14/2024   Lab Results  Component Value Date   HGBA1C 5.2 08/14/2024   Lab Results  Component Value Date   CHOL 98 08/14/2024   HDL 38 (L) 08/14/2024   LDLCALC 37 08/14/2024   TRIG 157 (H) 08/14/2024   CHOLHDL 2.6 08/14/2024    Significant Diagnostic Results in last 30 days:  No results found.  Assessment/Plan  Type 2 diabetes mellitus Blood sugars are well controlled with current regimen, but nocturnal hypoglycemia occurs, with levels dropping to 60-65 mg/dL. Current regimen includes Ozempic  1 mg and Lantus  8 units in the  morning. Plan to reduce Lantus  due to low blood sugars. Discussed potential discontinuation of Lantus  if blood sugars remain controlled with Ozempic  alone. - Reduced Lantus  by 2 units and instructed to monitor  blood sugars. - Ordered A1c test. - Instructed to monitor blood sugars at home and adjust Lantus  as needed. - Will consider discontinuing Lantus  if blood sugars remain controlled with Ozempic  alone.  Left hip pain Pain localized to the left hip, exacerbated by certain positions and movements. No tenderness to touch, but pain occurs when lying down or sitting in certain ways. No redness or swelling. Differential includes age-related changes or previous injury. - Ordered x-ray of the left hip at [facility]. - Recommended use of Tylenol  or topical analgesics like Salonpas, Bengay, or Voltaren gel for pain relief.  Primary hypertension Blood pressure management includes losartan  50 mg daily and propranolol  120 mg at bedtime for essential tremors. - Continue current antihypertensive regimen.  Mixed hyperlipidemia Currently managed with atorvastatin  40 mg at bedtime. - Continue atorvastatin  40 mg at bedtime.  Essential tremor Managed with propranolol  120 mg at bedtime. - Continue propranolol  120 mg at bedtime.  Generalized anxiety disorder Anxiety managed with buspirone  7.5 mg in the morning. She reports difficulty with anxiety and is not taking the second dose as prescribed. - Continue buspirone  7.5 mg in the morning.  Slow transit constipation Intermittent constipation managed with Colace 50 mg as needed. She reports occasional use of glycerin  suppositories, which are effective. - Continue Colace 50 mg as needed. - Use glycerin  suppositories as needed for constipation relief.  General health maintenance Immunizations are up to date, including COVID, tetanus, pneumonia, and shingles vaccines. She has not yet seen a podiatrist or dentist this year. - Encouraged scheduling appointments with a podiatrist and dentist. - Continue routine health maintenance and screenings.   Family/ staff Communication: Reviewed plan of care with patient verbalized understanding   Labs/tests  ordered:  - CBC with Differential/Platelet - CMP with eGFR([facility]) - TSH - Hgb A1C - Lipid panel - Urine microalbumin creatinine ratio - Dg hip 2-3 views   Next Appointment : Return in about 6 months (around 02/12/2025) for medical mangement of chronic issues.SABRA   Spent 30 minutes of Face to face and non-face to face with patient  >50% time spent counseling; reviewing medical record; tests; labs; documentation and developing future plan of care.   Roxan JAYSON Plough, NP

## 2024-08-18 ENCOUNTER — Other Ambulatory Visit: Payer: Self-pay | Admitting: Family

## 2024-08-18 DIAGNOSIS — G250 Essential tremor: Secondary | ICD-10-CM

## 2024-08-18 MED ORDER — PROPRANOLOL HCL ER 120 MG PO CP24: 120.0000 mg | ORAL_CAPSULE | Freq: Every day | ORAL | 1 refills | Status: AC

## 2024-08-18 NOTE — Telephone Encounter (Signed)
 Copied from CRM [id number]. Topic: Clinical - Medication Refill >> Aug 18, 2024  9:02 AM Alfonso ORN wrote: Medication: propranolol  ER (INDERAL  LA) 120 MG 24 hr capsule  Has the patient contacted their pharmacy? Pt requested thru [facility] and the medication is out of stock with [facility]  and pt want [facility]ill [facility] [place] (Agent: If no, request that the patient contact the pharmacy for the refill. If patient does not wish to contact the pharmacy document the reason why and proceed with request.) (Agent: If yes, when and what did the pharmacy advise?)  This is the patient's preferred pharmacy:  [id number] - [place], [place] - [address] [place]  [address] Phone: [phone] Fax: [phone]  Is this the correct pharmacy for this prescription? Yes If no, delete pharmacy and type the correct one.   Has the prescription been filled recently? No  Is the patient out of the medication? No have 1 pill left   Has the patient been seen for an appointment in the last year OR does the patient have an upcoming appointment? Yes  Can we respond through MyChart? No  Agent: Please be advised that Rx refills may take up to 3 business days. We ask that you follow-up with your pharmacy.

## 2024-08-18 NOTE — Addendum Note (Signed)
 Addended byBETHA LEONARDA BURDOCK C on: 08/18/2024 04:12 PM   Modules accepted: Orders

## 2024-08-18 NOTE — Telephone Encounter (Signed)
Pended Rx and sent to [facility] for approval.  ?

## 2024-08-18 NOTE — Addendum Note (Signed)
 Addended by: LYNNANN COUGH A on: 08/18/2024 03:00 PM   Modules accepted: Orders

## 2024-08-20 DIAGNOSIS — F411 Generalized anxiety disorder: Secondary | ICD-10-CM

## 2024-08-22 MED ORDER — ATORVASTATIN CALCIUM 40 MG PO TABS: 40.0000 mg | ORAL_TABLET | Freq: Every day | ORAL | 3 refills | Status: AC

## 2024-08-22 MED ORDER — BUSPIRONE HCL 7.5 MG PO TABS: 7.5000 mg | ORAL_TABLET | Freq: Two times a day (BID) | ORAL | 3 refills | Status: AC

## 2024-08-22 MED ORDER — FUROSEMIDE 20 MG PO TABS: 20.0000 mg | ORAL_TABLET | Freq: Every day | ORAL | 3 refills | Status: DC | PRN

## 2024-08-25 ENCOUNTER — Ambulatory Visit: Admitting: Family

## 2024-09-02 ENCOUNTER — Other Ambulatory Visit: Payer: Self-pay | Admitting: Family

## 2024-09-02 ENCOUNTER — Ambulatory Visit
Admission: RE | Admit: 2024-09-02 | Discharge: 2024-09-02 | Disposition: A | Discharge status: Home / Self Care | Source: Ambulatory Visit | Attending: Family | Admitting: Family

## 2024-09-02 ENCOUNTER — Ambulatory Visit: Admitting: Family

## 2024-09-02 ENCOUNTER — Encounter: Payer: Self-pay | Admitting: Family

## 2024-09-02 VITALS — BP 128/72 | HR 71 | Temp 97.7°F | Ht 68.0 in | Wt 261.0 lb

## 2024-09-02 DIAGNOSIS — Z0000 Encounter for general adult medical examination without abnormal findings: Secondary | ICD-10-CM | POA: Diagnosis not present

## 2024-09-02 DIAGNOSIS — M792 Neuralgia and neuritis, unspecified: Secondary | ICD-10-CM

## 2024-09-02 DIAGNOSIS — M25552 Pain in left hip: Secondary | ICD-10-CM

## 2024-09-02 MED ORDER — GABAPENTIN 100 MG PO CAPS: 100.0000 mg | ORAL_CAPSULE | Freq: Every day | ORAL | 3 refills | Status: DC

## 2024-09-02 MED ORDER — GABAPENTIN 100 MG PO CAPS: 100.0000 mg | ORAL_CAPSULE | Freq: Every day | ORAL | 0 refills | Status: DC

## 2024-09-02 NOTE — Patient Instructions (Signed)
 Wendy Obrien , Thank you for taking time to come for your Medicare Wellness Visit. I appreciate your ongoing commitment to your health goals. Please review the following plan we discussed and let me know if I can assist you in the future.   Screening recommendations/referrals: Colonoscopy : Up to date  Mammogram  : Up to date  Bone Density  : Up to date  Recommended yearly ophthalmology/optometry visit for glaucoma screening and checkup Recommended yearly dental visit for hygiene and checkup  Vaccinations: Influenza vaccine- due annually in September/October Pneumococcal vaccine  : Up to date  Tdap vaccine  : Up to date  Shingles vaccine  : Up to date     Advanced directives:   Conditions/risks identified:   Next appointment: 1 year    Preventive Care 29 Years and Older, Female Preventive care refers to lifestyle choices and visits with your health care provider that can promote health and wellness. What does preventive care include? A yearly physical exam. This is also called an annual well check. Dental exams once or twice a year. Routine eye exams. Ask your health care provider how often you should have your eyes checked. Personal lifestyle choices, including: Daily care of your teeth and gums. Regular physical activity. Eating a healthy diet. Avoiding tobacco and drug use. Limiting alcohol use. Practicing safe sex. Taking low-dose aspirin  every day. Taking vitamin and mineral supplements as recommended by your health care provider. What happens during an annual well check? The services and screenings done by your health care provider during your annual well check will depend on your age, overall health, lifestyle risk factors, and family history of disease. Counseling  Your health care provider may ask you questions about your: Alcohol use. Tobacco use. Drug use. Emotional well-being. Home and relationship well-being. Sexual activity. Eating habits. History of  falls. Memory and ability to understand (cognition). Work and work astronomer. Reproductive health. Screening  You may have the following tests or measurements: Height, weight, and BMI. Blood pressure. Lipid and cholesterol levels. These may be checked every 5 years, or more frequently if you are over 72 years old. Skin check. Lung cancer screening. You may have this screening every year starting at age 32 if you have a 30-pack-year history of smoking and currently smoke or have quit within the past 15 years. Fecal occult blood test (FOBT) of the stool. You may have this test every year starting at age 83. Flexible sigmoidoscopy or colonoscopy. You may have a sigmoidoscopy every 5 years or a colonoscopy every 10 years starting at age 12. Hepatitis C blood test. Hepatitis B blood test. Sexually transmitted disease (STD) testing. Diabetes screening. This is done by checking your blood sugar (glucose) after you have not eaten for a while (fasting). You may have this done every 1-3 years. Bone density scan. This is done to screen for osteoporosis. You may have this done starting at age 58. Mammogram. This may be done every 1-2 years. Talk to your health care provider about how often you should have regular mammograms. Talk with your health care provider about your test results, treatment options, and if necessary, the need for more tests. Vaccines  Your health care provider may recommend certain vaccines, such as: Influenza vaccine. This is recommended every year. Tetanus, diphtheria, and acellular pertussis (Tdap, Td) vaccine. You may need a Td booster every 10 years. Zoster vaccine. You may need this after age 50. Pneumococcal 13-valent conjugate (PCV13) vaccine. One dose is recommended after age 78.  Pneumococcal polysaccharide (PPSV23) vaccine. One dose is recommended after age 73. Talk to your health care provider about which screenings and vaccines you need and how often you need  them. This information is not intended to replace advice given to you by your health care provider. Make sure you discuss any questions you have with your health care provider. Document Released: 09/24/2015 Document Revised: 05/17/2016 Document Reviewed: 06/29/2015 Elsevier Interactive Patient Education  2017 [employer].  Fall Prevention in the Home Falls can cause injuries. They can happen to people of all ages. There are many things you can do to make your home safe and to help prevent falls. What can I do on the outside of my home? Regularly fix the edges of walkways and driveways and fix any cracks. Remove anything that might make you trip as you walk through a door, such as a raised step or threshold. Trim any bushes or trees on the path to your home. Use bright outdoor lighting. Clear any walking paths of anything that might make someone trip, such as rocks or tools. Regularly check to see if handrails are loose or broken. Make sure that both sides of any steps have handrails. Any raised decks and porches should have guardrails on the edges. Have any leaves, snow, or ice cleared regularly. Use sand or salt on walking paths during winter. Clean up any spills in your garage right away. This includes oil or grease spills. What can I do in the bathroom? Use night lights. Install grab bars by the toilet and in the tub and shower. Do not use towel bars as grab bars. Use non-skid mats or decals in the tub or shower. If you need to sit down in the shower, use a plastic, non-slip stool. Keep the floor dry. Clean up any water that spills on the floor as soon as it happens. Remove soap buildup in the tub or shower regularly. Attach bath mats securely with double-sided non-slip rug tape. Do not have throw rugs and other things on the floor that can make you trip. What can I do in the bedroom? Use night lights. Make sure that you have a light by your bed that is easy to reach. Do not use  any sheets or blankets that are too big for your bed. They should not hang down onto the floor. Have a firm chair that has side arms. You can use this for support while you get dressed. Do not have throw rugs and other things on the floor that can make you trip. What can I do in the kitchen? Clean up any spills right away. Avoid walking on wet floors. Keep items that you use a lot in easy-to-reach places. If you need to reach something above you, use a strong step stool that has a grab bar. Keep electrical cords out of the way. Do not use floor polish or wax that makes floors slippery. If you must use wax, use non-skid floor wax. Do not have throw rugs and other things on the floor that can make you trip. What can I do with my stairs? Do not leave any items on the stairs. Make sure that there are handrails on both sides of the stairs and use them. Fix handrails that are broken or loose. Make sure that handrails are as long as the stairways. Check any carpeting to make sure that it is firmly attached to the stairs. Fix any carpet that is loose or worn. Avoid having throw rugs at the  top or bottom of the stairs. If you do have throw rugs, attach them to the floor with carpet tape. Make sure that you have a light switch at the top of the stairs and the bottom of the stairs. If you do not have them, ask someone to add them for you. What else can I do to help prevent falls? Wear shoes that: Do not have high heels. Have rubber bottoms. Are comfortable and fit you well. Are closed at the toe. Do not wear sandals. If you use a stepladder: Make sure that it is fully opened. Do not climb a closed stepladder. Make sure that both sides of the stepladder are locked into place. Ask someone to hold it for you, if possible. Clearly mark and make sure that you can see: Any grab bars or handrails. First and last steps. Where the edge of each step is. Use tools that help you move around (mobility aids)  if they are needed. These include: Canes. Walkers. Scooters. Crutches. Turn on the lights when you go into a dark area. Replace any light bulbs as soon as they burn out. Set up your furniture so you have a clear path. Avoid moving your furniture around. If any of your floors are uneven, fix them. If there are any pets around you, be aware of where they are. Review your medicines with your doctor. Some medicines can make you feel dizzy. This can increase your chance of falling. Ask your doctor what other things that you can do to help prevent falls. This information is not intended to replace advice given to you by your health care provider. Make sure you discuss any questions you have with your health care provider. Document Released: 06/24/2009 Document Revised: 02/03/2016 Document Reviewed: 10/02/2014 Elsevier Interactive Patient Education  2017 [employer].

## 2024-09-02 NOTE — Progress Notes (Signed)
 "  Chief Complaint  Patient presents with   Medicare Wellness    Annual wellness visit. Care gaps discussed. Patient due for foot exam patient has appointment 09/18/2024. Also seeing cardio 09/16/2024 Dr. Artelia. Patient has x-ray of left hip due today.      Subjective:   Wendy Obrien is a 73 y.o. female who presents for a Medicare Annual Wellness Visit.  Visit info / Clinical Intake: Medicare Wellness Visit Type:: Subsequent Annual Wellness Visit Persons participating in visit and providing information:: patient Medicare Wellness Visit Mode:: In-person (required for WTM) Interpreter Needed?: No Pre-visit prep was completed: no AWV questionnaire completed by patient prior to visit?: no Living arrangements:: lives with spouse/significant other Patient's Overall Health Status Rating: good Typical amount of pain: (!) a lot (B/L leg pain. 8/10 pain scale.) Does pain affect daily life?: (!) yes Are you currently prescribed opioids?: no  Dietary Habits and Nutritional Risks How many meals a day?: 3 Eats fruit and vegetables daily?: yes (Some) Most meals are obtained by: preparing own meals In the last 2 weeks, have you had any of the following?: none  Functional Status Activities of Daily Living (to include ambulation/medication): Independent Ambulation: Independent Medication Administration: Independent Home Management (perform basic housework or laundry): Independent Manage your own finances?: yes Primary transportation is: driving Concerns about vision?: (!) yes Concerns about hearing?: no  Fall Screening Falls in the past year?: 0 Number of falls in past year: 0 Was there an injury with Fall?: 0 Fall Risk Category Calculator: 0 Patient Fall Risk Level: Low Fall Risk  Fall Risk Patient at Risk for Falls Due to: No Fall Risks Fall risk Follow up: Falls evaluation completed  Home and Transportation Safety: All rugs have non-skid backing?: yes All stairs or steps  have railings?: N/A, no stairs Grab bars in the bathtub or shower?: yes Have non-skid surface in bathtub or shower?: yes Good home lighting?: yes Regular seat belt use?: yes Hospital stays in the last year:: no  Cognitive Assessment Difficulty concentrating, remembering, or making decisions? : no Will 6CIT or Mini Cog be Completed: no  Advance Directives (For Healthcare) Does Patient Have a Medical Advance Directive?: No Would patient like information on creating a medical advance directive?: No - Patient declined  Reviewed/Updated  Reviewed/Updated: Reviewed All (Medical, Surgical, Family, Medications, Allergies, Care Teams, Patient Goals); Medical History; Surgical History; Allergies; Medications; Family History    Allergies (verified) Bee venom, Beeswax, Fish allergy, Shellfish allergy, Egg protein-containing drug products, Aspirin , Nsaids, and Sulfa antibiotics   Current Medications (verified) Outpatient Encounter Medications as of 09/02/2024  Medication Sig   atorvastatin  (LIPITOR) 40 MG tablet Take 1 tablet (40 mg total) by mouth at bedtime.   B-D ULTRAFINE III SHORT PEN 31G X 8 MM MISC SMARTSIG:injection Twice Daily DX: E11.65   busPIRone  (BUSPAR ) 7.5 MG tablet Take 1 tablet (7.5 mg total) by mouth 2 (two) times daily.   Continuous Glucose Receiver (FREESTYLE LIBRE 2 READER) DEVI 1 Device by Does not apply route daily. E11.65   Continuous Glucose Sensor (FREESTYLE LIBRE 2 PLUS SENSOR) MISC Change sensor every 15 days. E11.65   docusate (COLACE) 50 MG/5ML liquid Take by mouth as needed for mild constipation. Patient takes one or twice a week as needed for constipation   EPINEPHrine  0.3 mg/0.3 mL IJ SOAJ injection Inject 0.3 mg into the muscle as needed for anaphylaxis.   furosemide  (LASIX ) 20 MG tablet Take 1 tablet (20 mg total) by mouth daily as needed.  hydrochlorothiazide  (HYDRODIURIL ) 25 MG tablet TAKE ONE TABLET BY MOUTH EVERY DAY WITH BREAKFAST   insulin  glargine  (LANTUS  SOLOSTAR) 100 UNIT/ML Solostar Pen Inject 6 Units into the skin daily.   latanoprost  (XALATAN ) 0.005 % ophthalmic solution INSTILL 1 DROP IN RIGHT EYE EVERY DAY AT BEDTIME - (REFRIGERATE UNTIL BOTTLE IS OPENED)   losartan  (COZAAR ) 50 MG tablet Take 1 tablet (50 mg total) by mouth daily.   Misc Natural Products (NEURIVA) CAPS Take 1 capsule by mouth daily with breakfast.   Multiple Vitamin (MULTIVITAMIN WITH MINERALS) TABS tablet Take 1 tablet by mouth daily with breakfast.   Omega-3 Fatty Acids (FISH OIL) 1200 MG CAPS Take 1,200 mg by mouth daily with breakfast.   potassium chloride  SA (KLOR-CON  M) 20 MEQ tablet Take 1 tablet (20 mEq total) by mouth daily. Along with Furosemide    propranolol  ER (INDERAL  LA) 120 MG 24 hr capsule Take 1 capsule (120 mg total) by mouth at bedtime.   Semaglutide , 2 MG/DOSE, 8 MG/3ML SOPN Inject 2 mg as directed once a week.   timolol  (TIMOPTIC ) 0.5 % ophthalmic solution Place 1 drop into the right eye every morning.   traZODone  (DESYREL ) 50 MG tablet TAKE ONE TABLET BY MOUTH EVERY DAY AT BEDTIME AS NEEDED FOR SLEEP. (Patient taking differently: as needed.)   Zinc  50 MG TABS Take by mouth.   nystatin  cream (MYCOSTATIN ) Apply 1 Application topically 2 (two) times daily. (Patient not taking: Reported on 09/02/2024)   No facility-administered encounter medications on file as of 09/02/2024.    History: Past Medical History:  Diagnosis Date   Allergy    Anxiety    Arthritis    Cataract    CHF (congestive heart failure) (HCC)    DM (diabetes mellitus) (HCC)    Essential tremor    head   G6PD deficiency    Glaucoma    Per new patient form   High cholesterol    Per new patient form   HTN (hypertension)    Hyperlipidemia    Sickle cell trait    Past Surgical History:  Procedure Laterality Date   ABDOMINAL HYSTERECTOMY     Dr. Delores / Per new patient form   ANKLE SURGERY Right    x 2   BREAST LUMPECTOMY Left    benign   CATARACT EXTRACTION Right  01/04/2024   [facility]   COLONOSCOPY  2025   DG  BONE DENSITY ([facility] HX)     Per new patient form   DIAGNOSTIC MAMMOGRAM     Per new patient form   EYE SURGERY Left    age 56 - L eye removed, Dr. Ayesha Gentry / Per new patient form   MINOR RELEASE DORSAL COMPARTMENT (DEQUERVAINS) Left 05/09/2022   Procedure: Left dequervains release and left dorsal forearm mass excision;  Surgeon: Alyse Agent, MD;  Location: [facility];  Service: Orthopedics;  Laterality: Left;  Regional block   REDUCTION MAMMAPLASTY Bilateral    1998   Family History  Problem Relation Age of Onset   Lung cancer Mother    Hypertension Mother    Diabetes Mother    Hypertension Father    Colon cancer Father    Sudden Cardiac Death Father    Colon polyps Sister    Colon polyps Sister    HIV Brother    Colon polyps Brother    HIV Brother    Stomach cancer Maternal Aunt    Colon cancer Paternal Aunt  Stomach cancer Paternal Uncle    Colon cancer Paternal Uncle    Arthritis Maternal Grandmother    Heart attack Maternal Grandfather    Colon cancer Other    Esophageal cancer Neg Hx    Rectal cancer Neg Hx    Social History   Occupational History   Not on file  Tobacco Use   Smoking status: Former    Current packs/day: 0.00    Average packs/day: 0.2 packs/day for 30.0 years (6.0 ttl pk-yrs)    Types: Cigarettes    Start date: 73    Quit date: 1998    Years since quitting: 27.9   Smokeless tobacco: Never  Vaping Use   Vaping status: Never Used  Substance and Sexual Activity   Alcohol use: Not Currently   Drug use: Never   Sexual activity: Yes   Tobacco Counseling Counseling given: Not Answered  SDOH Screenings   Depression (PHQ2-9): Low Risk (09/02/2024)  Tobacco Use: Medium Risk (09/02/2024)   See flowsheets for full screening details  Depression Screen PHQ 2 & 9 Depression Scale- Over the past 2 weeks, how often have you been bothered by any of the  following problems? Little interest or pleasure in doing things: 0 Feeling down, depressed, or hopeless (PHQ Adolescent also includes...irritable): 0 PHQ-2 Total Score: 0     Goals Addressed   None          Objective:    Today's Vitals   09/02/24 1058  BP: 128/72  Pulse: 71  Temp: 97.7 F (36.5 C)  SpO2: 97%  Weight: 261 lb (118.4 kg)  Height: 5' 8 (1.727 m)  PainSc: 8   PainLoc: Leg   Body mass index is 39.68 kg/m.  Hearing/Vision screen Hearing Screening - Comments:: Patient denies any hearing issues.  Vision Screening - Comments:: Last exam 1 month ago.  Immunizations and Health Maintenance Health Maintenance  Topic Date Due   Medicare Annual Wellness (AWV)  08/15/2023   FOOT EXAM  09/18/2024 (Originally 02/29/2024)   COVID-19 Vaccine (10 - Pfizer risk 2025-26 season) 11/19/2024   HEMOGLOBIN A1C  02/12/2025   OPHTHALMOLOGY EXAM  06/18/2025   Diabetic kidney evaluation - eGFR measurement  08/14/2025   Diabetic kidney evaluation - Urine ACR  08/14/2025   Mammogram  05/30/2026   Colonoscopy  09/23/2028   DTaP/Tdap/Td (3 - Td or Tdap) 11/27/2032   Pneumococcal Vaccine: 50+ Years  Completed   Influenza Vaccine  Completed   Bone Density Scan  Completed   Hepatitis C Screening  Completed   Zoster Vaccines- Shingrix  Completed   Meningococcal B Vaccine  Aged Out        Assessment/Plan:  This is a routine wellness examination for Wendy Obrien.  Patient Care Team: Chanler Mendonca, Roxan BROCKS, NP as PCP - General (Family Medicine) Nahser, Aleene PARAS, MD (Inactive) as PCP - Cardiology (Cardiology) Tat, Asberry RAMAN, DO as Consulting Physician (Neurology) [facility]  I have personally reviewed and noted the following in the patients chart:   Medical and social history Use of alcohol, tobacco or illicit drugs  Current medications and supplements including opioid prescriptions. Functional ability and status Nutritional status Physical activity Advanced  directives List of other physicians Hospitalizations, surgeries, and ER visits in previous 12 months Vitals Screenings to include cognitive, depression, and falls Referrals and appointments  No orders of the defined types were placed in this encounter.  In addition, I have reviewed and discussed with patient certain preventive protocols, quality metrics, and best  practice recommendations. A written personalized care plan for preventive services as well as general preventive health recommendations were provided to patient.   Roxan BROCKS Lory Nowaczyk, NP   09/02/2024   No follow-ups on file.  After Visit Summary: (In Person-Printed) AVS printed and given to the patient  Nurse Notes: Up to date  "

## 2024-09-03 ENCOUNTER — Other Ambulatory Visit: Payer: Self-pay | Admitting: Family

## 2024-09-05 MED ORDER — FUROSEMIDE 20 MG PO TABS: 20.0000 mg | ORAL_TABLET | Freq: Every day | ORAL | 0 refills | Status: DC | PRN

## 2024-09-09 ENCOUNTER — Other Ambulatory Visit: Payer: Self-pay | Admitting: Family

## 2024-09-09 DIAGNOSIS — M792 Neuralgia and neuritis, unspecified: Secondary | ICD-10-CM

## 2024-09-15 MED ORDER — GABAPENTIN 300 MG PO CAPS: 300.0000 mg | ORAL_CAPSULE | Freq: Every day | ORAL | 1 refills | Status: DC

## 2024-09-16 ENCOUNTER — Encounter: Payer: Self-pay | Admitting: Internal Medicine

## 2024-09-16 ENCOUNTER — Ambulatory Visit: Admitting: Internal Medicine

## 2024-09-16 VITALS — BP 104/60 | HR 65 | Ht 68.0 in | Wt 263.0 lb

## 2024-09-16 DIAGNOSIS — I5032 Chronic diastolic (congestive) heart failure: Secondary | ICD-10-CM | POA: Insufficient documentation

## 2024-09-16 DIAGNOSIS — E782 Mixed hyperlipidemia: Secondary | ICD-10-CM | POA: Diagnosis not present

## 2024-09-16 DIAGNOSIS — I10 Essential (primary) hypertension: Secondary | ICD-10-CM | POA: Diagnosis not present

## 2024-09-16 NOTE — Patient Instructions (Signed)
" ° °  Follow-Up: At [facility], you and your health needs are our priority.  As part of our continuing mission to provide you with exceptional heart care, our providers are all part of one team.  This team includes your primary Cardiologist (physician) and Advanced Practice Providers or APPs (Physician Assistants and Nurse Practitioners) who all work together to provide you with the care you need, when you need it.  Your next appointment:   1 year(s)  Provider:   Emeline FORBES Calender, DO           "

## 2024-09-16 NOTE — Progress Notes (Signed)
 " Cardiology Office Note:  .   Date:  09/16/2024  ID:  Wendy Obrien, DOB 03-Jun-1951, MRN [id number] PCP: Leonarda Roxan BROCKS, NP  [facility] Providers Cardiologist:  Aleene Passe, MD (Inactive)    History of Present Illness: .   Wendy Obrien is a 74 y.o. female Discussed the use of AI scribe software for clinical note transcription with the patient, who gave verbal consent to proceed.  History of Present Illness Wendy Obrien is a 74 year old female with hypertension, diastolic heart failure, and dyslipidemia who presents for an annual follow-up.  She experiences fatigue and occasional weakness during the day, particularly after activities such as cleaning, doing laundry, and working on projects. She attributes some of this fatigue to her age and busy lifestyle, which includes caring for grandchildren and participating in church activities. No chest pain, shortness of breath, or sweating.  She has leg swelling, which has improved with the use of compression stockings, leg elevation, and dietary salt restriction.  She has a history of hypertension and is currently taking losartan  50 mg, hydrochlorothiazide  25 mg, and Lasix  20 mg. She states that her blood pressure reading of 104/60 mmHg during the visit is lower than her usual readings. She has not been monitoring her blood pressure at home.  She has a history of diabetes and is on insulin  and semaglutide  for management. She also takes atorvastatin  40 mg for dyslipidemia.  She reports difficulty sleeping and was prescribed trazodone , which she has used sparingly due to side effects. She denies having sleep apnea, noting that her husband would inform her if she did.      ROS: Otherwise further review of systems is negative  Studies Reviewed: .        Results Diagnostic Myocardial perfusion imaging (03/20/2021): Normal, no ischemia Echocardiogram (03/20/2021): Ejection fraction 60-65%, grade 1 diastolic dysfunction,  mild mitral regurgitation EKG (09/15/2024): No changes compared to prior, normal (Independently interpreted) Risk Assessment/Calculations:             Physical Exam:   VS:  BP 104/60   Pulse 65   Ht 5' 8 (1.727 m)   Wt 263 lb (119.3 kg)   SpO2 97%   BMI 39.99 kg/m    Wt Readings from Last 3 Encounters:  09/16/24 263 lb (119.3 kg)  09/02/24 261 lb (118.4 kg)  08/14/24 264 lb 3.2 oz (119.8 kg)    GEN: Well nourished, well developed in no acute distress CARDIAC: RRR, no murmurs, no rubs, no gallops RESPIRATORY:  Clear to auscultation without rales, wheezing or rhonchi  ABDOMEN: Soft, non-tender, non-distended EXTREMITIES:  No edema; No deformity   ASSESSMENT AND PLAN: .    Assessment and Plan Assessment & Plan Chronic diastolic heart failure Ejection fraction 60-65%, grade one diastolic dysfunction. Fatigue and weakness noted, no chest pain or dyspnea. No current edema, previously managed with compression stockings. Blood pressure 104/60 mmHg, EKG unchanged. - Monitor blood pressure during fatigue episodes for hypotension. - Adjust medications if hypotension is observed. - Continue current medications including Lasix .  Hypertension Blood pressure 104/60 mmHg, lower than usual but well-controlled. Fatigue possibly related to low blood pressure versus high activity level. - Monitor blood pressure during fatigue episodes for hypotension.  If no hypotension then her symptoms are likely due to her high activity level - Adjust medications if hypotension is observed.  Dyslipidemia - Continue atorvastatin  40 mg            Follow up: 1  year  Signed, Emeline FORBES Calender, DO  09/16/2024 8:02 AM    [facility] "

## 2024-10-01 ENCOUNTER — Other Ambulatory Visit: Payer: Self-pay | Admitting: Family

## 2024-10-07 DIAGNOSIS — F5101 Primary insomnia: Secondary | ICD-10-CM

## 2024-10-07 DIAGNOSIS — M792 Neuralgia and neuritis, unspecified: Secondary | ICD-10-CM

## 2024-10-07 MED ORDER — TRAZODONE HCL 50 MG PO TABS: 50.0000 mg | ORAL_TABLET | Freq: Every evening | ORAL | 1 refills | Status: AC | PRN

## 2024-10-07 MED ORDER — GABAPENTIN 300 MG PO CAPS: 300.0000 mg | ORAL_CAPSULE | Freq: Every day | ORAL | 1 refills | Status: AC

## 2024-10-15 ENCOUNTER — Telehealth: Payer: Self-pay

## 2024-10-15 NOTE — Telephone Encounter (Signed)
 1.Recommend weaning off Gabapentin  if causing memory loss. Amnesia is listed as one of the side effects.   2. May continue with Nystatin  cream if rash is similar to previous.will evaluate during upcoming visit.

## 2024-10-15 NOTE — Telephone Encounter (Signed)
 1.) Patient was taking Gabapentin  300 mg at bedtime and noticed that it appears to be affecting her memory (more confused than usual). Patient decided on her own to go back to the 100 mg. Patient is asking is this a commons side effect of the medication and what Dinah's thoughts are about this  Side note: Patient was increased to 300 mg due to the 100 mg being ineffective    2.) Patient had a rash/fungal condition in the past and was prescribed nystatin  cream. The rash went away, however patient now has a rash in another place and has begun using the cream again. Patient would like to know Dinah's thoughts about this.   Patient has an appointment for Monday (earliest she can come), however is seeking advice or recommendations in the meantime

## 2024-10-15 NOTE — Telephone Encounter (Signed)
 Please provide instructions for weaning off gabapentin 

## 2024-10-15 NOTE — Telephone Encounter (Signed)
 Will discuss weaning off Gabapentin  during upcoming visit

## 2024-10-16 ENCOUNTER — Ambulatory Visit: Admitting: Family

## 2024-10-16 NOTE — Telephone Encounter (Signed)
 Left message on voicemail for patient informing her that I am sending a mychart message with the providers response.

## 2024-10-20 ENCOUNTER — Ambulatory Visit: Admitting: Family

## 2025-09-08 ENCOUNTER — Ambulatory Visit: Admitting: Family
